# Patient Record
Sex: Female | Born: 1990 | Race: White | Hispanic: No | Marital: Single | Smoking: Current every day smoker
Health system: Southern US, Academic
[De-identification: ages and names within clinical notes are randomized; demographics above are authoritative.]

## PROBLEM LIST (undated history)

## (undated) DIAGNOSIS — F419 Anxiety disorder, unspecified: Secondary | ICD-10-CM

## (undated) DIAGNOSIS — F32A Depression, unspecified: Secondary | ICD-10-CM

## (undated) DIAGNOSIS — K219 Gastro-esophageal reflux disease without esophagitis: Secondary | ICD-10-CM

## (undated) DIAGNOSIS — E079 Disorder of thyroid, unspecified: Secondary | ICD-10-CM

## (undated) DIAGNOSIS — F909 Attention-deficit hyperactivity disorder, unspecified type: Secondary | ICD-10-CM

## (undated) DIAGNOSIS — D649 Anemia, unspecified: Secondary | ICD-10-CM

## (undated) DIAGNOSIS — T7840XA Allergy, unspecified, initial encounter: Secondary | ICD-10-CM

## (undated) DIAGNOSIS — J302 Other seasonal allergic rhinitis: Secondary | ICD-10-CM

## (undated) DIAGNOSIS — G709 Myoneural disorder, unspecified: Secondary | ICD-10-CM

## (undated) DIAGNOSIS — B977 Papillomavirus as the cause of diseases classified elsewhere: Secondary | ICD-10-CM

## (undated) HISTORY — DX: Disorder of thyroid, unspecified: E07.9

## (undated) HISTORY — DX: Myoneural disorder, unspecified: G70.9

## (undated) HISTORY — DX: Anxiety disorder, unspecified: F41.9

## (undated) HISTORY — DX: Attention-deficit hyperactivity disorder, unspecified type: F90.9

## (undated) HISTORY — DX: Allergy, unspecified, initial encounter: T78.40XA

## (undated) HISTORY — DX: Depression, unspecified: F32.A

## (undated) HISTORY — PX: FOOT SURGERY: SHX648

## (undated) HISTORY — DX: Anemia, unspecified: D64.9

## (undated) HISTORY — DX: Gastro-esophageal reflux disease without esophagitis: K21.9

## (undated) HISTORY — PX: DENTAL SURGERY: SHX609

---
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
## 2008-02-02 ENCOUNTER — Encounter: Payer: Self-pay | Admitting: Obstetrics & Gynecology

## 2008-02-02 ENCOUNTER — Ambulatory Visit: Payer: Self-pay | Admitting: Obstetrics and Gynecology

## 2009-03-07 ENCOUNTER — Emergency Department (HOSPITAL_COMMUNITY): Admission: EM | Admit: 2009-03-07 | Discharge: 2009-03-07 | Payer: Self-pay | Admitting: Family Medicine

## 2009-11-14 ENCOUNTER — Emergency Department (HOSPITAL_COMMUNITY): Admission: EM | Admit: 2009-11-14 | Discharge: 2009-11-15 | Payer: Self-pay | Admitting: Emergency Medicine

## 2010-01-19 ENCOUNTER — Emergency Department (HOSPITAL_COMMUNITY)
Admission: EM | Admit: 2010-01-19 | Discharge: 2010-01-19 | Payer: Self-pay | Source: Home / Self Care | Admitting: Family Medicine

## 2010-05-15 ENCOUNTER — Ambulatory Visit: Admit: 2010-05-15 | Payer: Self-pay | Admitting: Obstetrics & Gynecology

## 2010-05-16 ENCOUNTER — Ambulatory Visit: Admit: 2010-05-16 | Payer: Self-pay | Admitting: Obstetrics & Gynecology

## 2010-05-24 ENCOUNTER — Ambulatory Visit: Payer: Self-pay | Admitting: Obstetrics & Gynecology

## 2010-06-24 ENCOUNTER — Inpatient Hospital Stay (INDEPENDENT_AMBULATORY_CARE_PROVIDER_SITE_OTHER)
Admission: RE | Admit: 2010-06-24 | Discharge: 2010-06-24 | Disposition: A | Discharge status: Home / Self Care | Payer: Self-pay | Source: Ambulatory Visit | Attending: Family Medicine | Admitting: Family Medicine

## 2010-06-24 DIAGNOSIS — J019 Acute sinusitis, unspecified: Secondary | ICD-10-CM

## 2010-08-01 ENCOUNTER — Ambulatory Visit: Payer: Self-pay | Admitting: Obstetrics and Gynecology

## 2010-08-09 ENCOUNTER — Ambulatory Visit (INDEPENDENT_AMBULATORY_CARE_PROVIDER_SITE_OTHER): Payer: Medicaid Other | Admitting: Advanced Practice Midwife

## 2010-08-09 ENCOUNTER — Other Ambulatory Visit: Payer: Self-pay | Admitting: Advanced Practice Midwife

## 2010-08-09 DIAGNOSIS — Z01419 Encounter for gynecological examination (general) (routine) without abnormal findings: Secondary | ICD-10-CM

## 2010-08-10 NOTE — Group Therapy Note (Unsigned)
Diane Stokes, Diane Stokes                 ACCOUNT NO.:  [id number]  MEDICAL RECORD NO.:  [id number]           PATIENT TYPE:  A  LOCATION:  [facility]                   FACILITY:  [facility]  PHYSICIAN:  Wynelle Bourgeois, CNM    DATE OF BIRTH:  March 28, 1991  DATE OF SERVICE:  08/09/2010                                 CLINIC NOTE  HISTORY OF PRESENT ILLNESS:  This is a 20 year old, gravida 0 who presents today for her annual exam and Pap smear.  She is also requesting contraception and STD testing.  She states that she is sexually active and that her previous boyfriend was possibly unfaithful to her.  She has previously been on birth control pills, but has trouble remembering to take them and is interested in Depo-Provera.  ALLERGIES:  MINOCYCLINE causes hives.  MEDICATIONS:  Allegra 1 p.o. daily.  MENSTRUAL HISTORY:  See previous note.  OBSTETRIC/GYNECOLOGICAL HISTORY:  Last Pap was in 2009 and was normal.  SURGICAL HISTORY:  Negative.  FAMILY HISTORY:  Diabetes, heart attack, and high blood pressure. Immediate family are healthy.  MEDICAL HISTORY:  A remote history of hypertension related to ADHD medication, otherwise negative.  SOCIAL HISTORY:  The patient quit school secondary to loss of scholarship.  She works at [employer] and denies any alcohol, tobacco, or drug use.  PHYSICAL EXAMINATION:  VITAL SIGNS:  Temperature 97.4, pulse 88, blood pressure 130/87, weight 241.1, and height 61-1/2 inches.  This weight reflects a  gain of 43 pounds. CARDIAC:  Heart rate regular rate and rhythm without murmur. LUNGS:  Clear to auscultation bilaterally. BREASTS:  Soft and nontender. ABDOMEN:  Soft and nontender, obese abdomen limits exam. GYN:  EGBUS within normal limits.  Vagina clear, well rugated without lesions or discharge.  Cervix is closed and long and nulliparous. Uterus is small, nontender with no masses.  Adnexa nontender.  No masses.  However, exam is limited by habitus. EXTREMITIES:   Normal.  ASSESSMENT: 1. Annual exam. 2. Requests sexually transmitted disease testing. 3. Request Depo-Provera.  PLAN: 1. Pap sent. 2. GC/Chlamydia sent. 3. Hep B and C, HIV, and RPR ordered. 4. Depo-Provera 150 mg IM q.12 weeks x1 year. 5. Return in 1 year or as needed.          ______________________________ Wynelle Bourgeois, CNM    MW/MEDQ  D:  08/09/2010  T:  08/10/2010  Job:  [id number]

## 2010-09-03 NOTE — Group Therapy Note (Signed)
Diane Stokes, Diane Stokes NO.:  [id number]   MEDICAL RECORD NO.:  [id number]          PATIENT TYPE:  WOC   LOCATION:  [facility]                   FACILITY:  [facility]   PHYSICIAN:  Argentina Donovan, MD        DATE OF BIRTH:  [birth year]   DATE OF SERVICE:  02/02/2008                                  CLINIC NOTE   REASON FOR VISIT:  Annual exam with Pap smear.   HISTORY OF PRESENT ILLNESS:  Diane Stokes is a 20 year old female, G0, who  presents today for her annual physical exam with Pap smear.  Her last  Pap smear was in August of 2008, and was normal.  She has not been  sexually active but does desire to start birth control at this time.  She has no other complaints today.   ALLERGIES:  She is ALLERGIC to MINOCYCLINE.  This causes hives for her.   CURRENT MEDICATIONS:  1. Vyvanse 70 mg daily for ADHD.  2. Sulfamethoxazole 500 mg b.i.d. for acne.  3. Claritin 10 mg daily for seasonal allergies.   IMMUNIZATIONS:  She has had Rubella, chickenpox, tetanus, and flu  vaccines.   MENSTRUAL HISTORY:  Her last menstrual period was January 10, 2008.  She began her menstrual cycles at the age of 20 years old and they are  regular every 28 days.  They last approximately 7 days.  They are heavy  for the first 4 to 5 days and then lighten up and stop.  She has mild  pain with her periods, and no bleeding in between periods.  She is not  currently using birth control, and she is not currently sexually active;  however, she does desire to start birth control today.   OBSTETRICAL HISTORY:  The patient has never been pregnant.   GYNECOLOGIC HISTORY:  Her last Pap smear was August 2008, and was  normal.  This was her first Pap smear.   PAST SURGICAL HISTORY:  Negative.   FAMILY HISTORY:  She does have grandparents with diabetes, history of  heart attack, and high blood pressure; however, both her parents and all  her siblings are healthy.   PAST MEDICAL HISTORY:  At one point in  time, she did have some problems  with high blood pressure, however, they did find that it was due to an  ADHD med she was taking at that time.  Since they have changed her meds,  she has had no more problems with hypertension.  Otherwise, her past  medical history is negative.   SOCIAL HISTORY:  She is a Consulting civil engineer at [employer].  She is  pre-nursing and hopes to become a midwife one day. She does not smoke or  drink alcohol.  She does drink approximately 2 caffeinated beverages a  day.  She does not use drugs.  Denies any physical or sexual abuse.   A complete 12-point review of systems was negative.   PHYSICAL EXAMINATION:  VITAL SIGNS:  Temperature is 98.5, pulse 92,  blood pressure 122/81, weight is 198 pounds, height is 61 inches.  CARDIOVASCULAR:  Regular  rate and rhythm without murmur, rub, or gallop.  She has 2+ radial and DP pulses.  LUNGS:  Clear to auscultation bilaterally with normal work of breathing  and no wheezing.  ABDOMEN:  Soft, nontender, and nondistended with no masses and positive  bowel sounds.  GYNECOLOGIC EXAM:  Normal external genitalia with no lesions.  Vagina is  pink and moist with normal rugae.  The cervix was visualized midline and  is a nullipara cervix without lesion or friability.  There was no  discharge seen from the os or in the vaginal vault.  Pap smear was  obtained today.  EXTREMITIES:  No cyanosis, clubbing, or edema.  SKIN:  Some mild acne on the face but otherwise without lesion or rash.   ASSESSMENT AND PLAN:  Diane Stokes is a 20 year old G0 female here today for  her complete physical examination with Pap smear.  1. Physical examination.  The patient's examination was completely      normal, and a Pap smear was obtained today, and the patient will be      mailed a letter with the results of her Pap smear.  She should      return again in 1 year for her next yearly examination.  2. For contraception, all options including  intrauterine device,      patch, pill, and Depo-Provera in a shot were discussed with the      patient and she desires to go on the pill.  She has no preference      for which pill and has not heard of one that she prefers over the      other.  She is limited due to finance and so it was decided that we      would put her on Sprintec.  She will start this daily as directed      and will let us know if she is having any problems with that.      Otherwise, she will follow up in 1 year for her next yearly      examination.     ______________________________  Ardeen Garland, MD    ______________________________  Argentina Donovan, MD    LM/MEDQ  D:  02/02/2008  T:  02/02/2008  Job:  [id number]

## 2010-09-28 ENCOUNTER — Emergency Department (HOSPITAL_COMMUNITY)
Admission: EM | Admit: 2010-09-28 | Discharge: 2010-09-28 | Disposition: A | Discharge status: Home / Self Care | Payer: PRIVATE HEALTH INSURANCE | Attending: Emergency Medicine | Admitting: Emergency Medicine

## 2010-09-28 DIAGNOSIS — L2989 Other pruritus: Secondary | ICD-10-CM | POA: Insufficient documentation

## 2010-09-28 DIAGNOSIS — H11419 Vascular abnormalities of conjunctiva, unspecified eye: Secondary | ICD-10-CM | POA: Insufficient documentation

## 2010-09-28 DIAGNOSIS — F988 Other specified behavioral and emotional disorders with onset usually occurring in childhood and adolescence: Secondary | ICD-10-CM | POA: Insufficient documentation

## 2010-09-28 DIAGNOSIS — R6889 Other general symptoms and signs: Secondary | ICD-10-CM | POA: Insufficient documentation

## 2010-09-28 DIAGNOSIS — H109 Unspecified conjunctivitis: Secondary | ICD-10-CM | POA: Insufficient documentation

## 2010-09-28 DIAGNOSIS — L298 Other pruritus: Secondary | ICD-10-CM | POA: Insufficient documentation

## 2010-09-28 DIAGNOSIS — H5789 Other specified disorders of eye and adnexa: Secondary | ICD-10-CM | POA: Insufficient documentation

## 2010-10-25 ENCOUNTER — Ambulatory Visit: Payer: PRIVATE HEALTH INSURANCE

## 2010-10-25 DIAGNOSIS — Z3049 Encounter for surveillance of other contraceptives: Secondary | ICD-10-CM

## 2010-11-06 ENCOUNTER — Telehealth: Payer: Self-pay | Admitting: *Deleted

## 2010-11-06 NOTE — Telephone Encounter (Signed)
Pt states she is having cramps- taking OTC med but not helping and wants Rx for pain. 1027- spoke w/pt who states her cramps have been getting worse since starting Depo Provera injections. She has been taking midol w/little relief of cramps.  I advised taking ibuprofen 600mg  q6hr prn w/food. I also informed pt that her sx are wnl and that her body will take about 6 mos to become fully accustomed to Depo. Pt voiced understanding and had no further questions.

## 2011-01-15 ENCOUNTER — Encounter: Payer: Self-pay | Admitting: Obstetrics and Gynecology

## 2011-01-15 ENCOUNTER — Ambulatory Visit (INDEPENDENT_AMBULATORY_CARE_PROVIDER_SITE_OTHER): Payer: PRIVATE HEALTH INSURANCE | Admitting: Obstetrics and Gynecology

## 2011-01-15 VITALS — BP 122/80 | HR 85

## 2011-01-15 DIAGNOSIS — Z3049 Encounter for surveillance of other contraceptives: Secondary | ICD-10-CM

## 2011-01-15 DIAGNOSIS — IMO0001 Reserved for inherently not codable concepts without codable children: Secondary | ICD-10-CM

## 2011-01-15 MED ORDER — MEDROXYPROGESTERONE ACETATE 150 MG/ML IM SUSP
150.0000 mg | Freq: Once | INTRAMUSCULAR | Status: AC
  Administered 2011-01-15: 150 mg via INTRAMUSCULAR

## 2011-02-26 ENCOUNTER — Emergency Department (INDEPENDENT_AMBULATORY_CARE_PROVIDER_SITE_OTHER)
Admission: EM | Admit: 2011-02-26 | Discharge: 2011-02-26 | Disposition: A | Discharge status: Home / Self Care | Payer: Self-pay | Source: Home / Self Care | Attending: Family Medicine | Admitting: Family Medicine

## 2011-02-26 ENCOUNTER — Encounter (HOSPITAL_COMMUNITY): Payer: Self-pay | Admitting: *Deleted

## 2011-02-26 DIAGNOSIS — S93401A Sprain of unspecified ligament of right ankle, initial encounter: Secondary | ICD-10-CM

## 2011-02-26 DIAGNOSIS — S93409A Sprain of unspecified ligament of unspecified ankle, initial encounter: Secondary | ICD-10-CM

## 2011-02-26 HISTORY — DX: Other seasonal allergic rhinitis: J30.2

## 2011-02-26 NOTE — ED Provider Notes (Signed)
History     CSN: [id number] Arrival date & time: 02/26/2011  7:39 PM   First MD Initiated Contact with Patient 02/26/11 1940      Chief Complaint  Patient presents with  . Ankle Pain    pt with osnet of right ankle foot pain x 2 days  - swelling - no known injury    (Consider location/radiation/quality/duration/timing/severity/associated sxs/prior treatment) Patient is a 20 y.o. female presenting with ankle pain. The history is provided by the patient.  Ankle Pain This is a new problem. The current episode started 2 days ago. The problem occurs constantly. The problem has not changed since onset.The symptoms are aggravated by walking.  SHE DENIES ANY INJURY. IS ON HER FEET A LOT. NO NUMBNESS OR TINGLING. HAS NOTED SWELLING OVER THE LATERAL MALLEOLUS OF THE RIGHT ANKLE  Past Medical History  Diagnosis Date  . Seasonal allergies     History reviewed. No pertinent past surgical history.  Family History  Problem Relation Age of Onset  . Diabetes Maternal Grandfather   . Hypertension Maternal Grandfather   . Hyperlipidemia Mother   . Hypertension Mother     History  Substance Use Topics  . Smoking status: Never Smoker   . Smokeless tobacco: Never Used  . Alcohol Use: Not on file    OB History    Grav Para Term Preterm Abortions TAB SAB Ect Mult Living   0               Review of Systems  Constitutional: Negative.   Respiratory: Negative.   Cardiovascular: Negative.     Allergies  Minocycline  Home Medications   Current Outpatient Rx  Name Route Sig Dispense Refill  . CETIRIZINE HCL 10 MG PO TABS Oral Take 10 mg by mouth daily.        BP 149/92  Pulse 86  Temp(Src) 98.6 F (37 C) (Oral)  Resp 18  Physical Exam  Nursing note and vitals reviewed. Constitutional: She appears well-developed.  Cardiovascular: Normal rate.   Pulmonary/Chest: Effort normal.  Musculoskeletal:       EVAL OF THE RIGHT ANKLE REVEALS SWELLING OVER THE LATERAL MALLEOLUS. ROM  INTACT. SENSORY INTACT DISTALLY. GOOD CAP REFILL AND DP PULSE. NO SKIN CHANGES. SLIGHT TENDERNESS OVER THE TIP OF THE DISTAL FIBULA    ED Course  Procedures (including critical care time)  Labs Reviewed - No data to display No results found.   No diagnosis found.    MDM          Randa Spike, MD 02/26/11 2003

## 2011-03-03 ENCOUNTER — Telehealth (HOSPITAL_COMMUNITY): Payer: Self-pay | Admitting: *Deleted

## 2011-04-02 ENCOUNTER — Ambulatory Visit (INDEPENDENT_AMBULATORY_CARE_PROVIDER_SITE_OTHER): Payer: Self-pay

## 2011-04-02 VITALS — BP 144/96 | HR 118

## 2011-04-02 DIAGNOSIS — Z3049 Encounter for surveillance of other contraceptives: Secondary | ICD-10-CM

## 2011-04-02 MED ORDER — MEDROXYPROGESTERONE ACETATE 150 MG/ML IM SUSP
150.0000 mg | Freq: Once | INTRAMUSCULAR | Status: AC
  Administered 2011-04-02: 150 mg via INTRAMUSCULAR

## 2011-04-17 ENCOUNTER — Ambulatory Visit
Admission: RE | Admit: 2011-04-17 | Discharge: 2011-04-17 | Disposition: A | Discharge status: Home / Self Care | Payer: Managed Care, Other (non HMO) | Source: Ambulatory Visit | Attending: Orthopedic Surgery | Admitting: Orthopedic Surgery

## 2011-04-17 ENCOUNTER — Other Ambulatory Visit: Payer: Self-pay | Admitting: Orthopedic Surgery

## 2011-04-17 DIAGNOSIS — M8430XA Stress fracture, unspecified site, initial encounter for fracture: Secondary | ICD-10-CM

## 2011-04-17 DIAGNOSIS — IMO0002 Reserved for concepts with insufficient information to code with codable children: Secondary | ICD-10-CM

## 2011-06-18 ENCOUNTER — Ambulatory Visit: Payer: Self-pay

## 2011-06-23 ENCOUNTER — Ambulatory Visit: Payer: Managed Care, Other (non HMO)

## 2011-07-07 ENCOUNTER — Ambulatory Visit (INDEPENDENT_AMBULATORY_CARE_PROVIDER_SITE_OTHER): Payer: Managed Care, Other (non HMO)

## 2011-07-07 VITALS — BP 122/77 | HR 74

## 2011-07-07 DIAGNOSIS — Z3049 Encounter for surveillance of other contraceptives: Secondary | ICD-10-CM

## 2011-07-07 LAB — POCT PREGNANCY, URINE: Preg Test, Ur: NEGATIVE

## 2011-07-07 MED ORDER — MEDROXYPROGESTERONE ACETATE 150 MG/ML IM SUSP
150.0000 mg | Freq: Once | INTRAMUSCULAR | Status: AC
  Administered 2011-07-07: 150 mg via INTRAMUSCULAR

## 2011-09-29 ENCOUNTER — Ambulatory Visit (INDEPENDENT_AMBULATORY_CARE_PROVIDER_SITE_OTHER): Payer: Managed Care, Other (non HMO) | Admitting: *Deleted

## 2011-09-29 VITALS — BP 118/78 | HR 88 | Temp 98.5°F | Ht 61.5 in | Wt 221.8 lb

## 2011-09-29 DIAGNOSIS — Z3049 Encounter for surveillance of other contraceptives: Secondary | ICD-10-CM

## 2011-09-29 MED ORDER — MEDROXYPROGESTERONE ACETATE 150 MG/ML IM SUSP
150.0000 mg | Freq: Once | INTRAMUSCULAR | Status: AC
  Administered 2011-09-29: 150 mg via INTRAMUSCULAR

## 2011-11-05 ENCOUNTER — Emergency Department: Payer: Self-pay | Admitting: Unknown Physician Specialty

## 2011-11-05 LAB — CBC
HGB: 14.4 g/dL (ref 12.0–16.0)
MCH: 30.2 pg (ref 26.0–34.0)
MCV: 92 fL (ref 80–100)
Platelet: 220 10*3/uL (ref 150–440)
RBC: 4.76 10*6/uL (ref 3.80–5.20)
RDW: 12.6 % (ref 11.5–14.5)
WBC: 6.1 10*3/uL (ref 3.6–11.0)

## 2011-11-05 LAB — URINALYSIS, COMPLETE
Blood: NEGATIVE
Ketone: NEGATIVE
Nitrite: NEGATIVE
Protein: NEGATIVE
Specific Gravity: 1.001 (ref 1.003–1.030)
WBC UR: 1 /HPF (ref 0–5)

## 2011-11-05 LAB — COMPREHENSIVE METABOLIC PANEL
Albumin: 4.1 g/dL (ref 3.4–5.0)
Alkaline Phosphatase: 92 U/L (ref 50–136)
BUN: 7 mg/dL (ref 7–18)
Calcium, Total: 8.8 mg/dL (ref 8.5–10.1)
EGFR (African American): >60
EGFR (Non-African Amer.): >60
Glucose: 99 mg/dL (ref 65–99)
Potassium: 3.4 mmol/L — ABNORMAL LOW (ref 3.5–5.1)
SGOT(AST): 23 U/L (ref 15–37)
Total Protein: 7.6 g/dL (ref 6.4–8.2)

## 2011-11-05 LAB — CK TOTAL AND CKMB (NOT AT ARMC)
CK, Total: 97 U/L (ref 21–215)
CK-MB: <0.5 ng/mL — ABNORMAL LOW (ref 0.5–3.6)

## 2011-11-05 LAB — TROPONIN I: Troponin-I: <0.02 ng/mL

## 2011-12-15 ENCOUNTER — Ambulatory Visit: Payer: Managed Care, Other (non HMO)

## 2011-12-15 ENCOUNTER — Ambulatory Visit: Payer: Managed Care, Other (non HMO) | Admitting: Obstetrics & Gynecology

## 2011-12-24 ENCOUNTER — Ambulatory Visit (INDEPENDENT_AMBULATORY_CARE_PROVIDER_SITE_OTHER): Payer: Managed Care, Other (non HMO) | Admitting: *Deleted

## 2011-12-24 VITALS — BP 130/87 | HR 82 | Temp 97.8°F | Ht 62.0 in | Wt 211.6 lb

## 2011-12-24 DIAGNOSIS — Z3049 Encounter for surveillance of other contraceptives: Secondary | ICD-10-CM

## 2011-12-24 DIAGNOSIS — IMO0001 Reserved for inherently not codable concepts without codable children: Secondary | ICD-10-CM

## 2011-12-24 MED ORDER — MEDROXYPROGESTERONE ACETATE 150 MG/ML IM SUSP
150.0000 mg | Freq: Once | INTRAMUSCULAR | Status: AC
  Administered 2011-12-24: 150 mg via INTRAMUSCULAR

## 2011-12-24 NOTE — Progress Notes (Signed)
Patient here for depoprovera- we discussed per chart review has not had annual exam/pap since April, 2012- discussed with Dr. Debroah Loop and patient may have depoprovera today, but then needs annual exam and pap if indicated before can get next depoprovera. Patient voices understanding.

## 2011-12-31 ENCOUNTER — Ambulatory Visit: Payer: Managed Care, Other (non HMO) | Admitting: Obstetrics and Gynecology

## 2012-01-16 ENCOUNTER — Ambulatory Visit: Payer: Managed Care, Other (non HMO) | Admitting: Obstetrics & Gynecology

## 2012-02-16 ENCOUNTER — Ambulatory Visit: Payer: Managed Care, Other (non HMO) | Admitting: Advanced Practice Midwife

## 2012-03-10 ENCOUNTER — Ambulatory Visit: Payer: Managed Care, Other (non HMO)

## 2012-03-10 ENCOUNTER — Encounter: Payer: Self-pay | Admitting: Obstetrics & Gynecology

## 2012-03-10 ENCOUNTER — Ambulatory Visit (INDEPENDENT_AMBULATORY_CARE_PROVIDER_SITE_OTHER): Payer: Managed Care, Other (non HMO) | Admitting: Obstetrics & Gynecology

## 2012-03-10 VITALS — BP 118/68 | HR 86 | Temp 98.2°F | Ht 61.5 in | Wt 198.0 lb

## 2012-03-10 DIAGNOSIS — Z01419 Encounter for gynecological examination (general) (routine) without abnormal findings: Secondary | ICD-10-CM

## 2012-03-10 DIAGNOSIS — Z3049 Encounter for surveillance of other contraceptives: Secondary | ICD-10-CM

## 2012-03-10 DIAGNOSIS — Z3009 Encounter for other general counseling and advice on contraception: Secondary | ICD-10-CM

## 2012-03-10 DIAGNOSIS — D699 Hemorrhagic condition, unspecified: Secondary | ICD-10-CM

## 2012-03-10 LAB — CBC WITH DIFFERENTIAL/PLATELET
Eosinophils Absolute: 0.1 10*3/uL (ref 0.0–0.7)
HCT: 41.9 % (ref 36.0–46.0)
Lymphs Abs: 1.6 10*3/uL (ref 0.7–4.0)
MCH: 31.5 pg (ref 26.0–34.0)
MCV: 91.1 fL (ref 78.0–100.0)
Monocytes Absolute: 0.5 10*3/uL (ref 0.1–1.0)
Monocytes Relative: 7 % (ref 3–12)
Neutro Abs: 3.9 10*3/uL (ref 1.7–7.7)
Neutrophils Relative %: 64 % (ref 43–77)
Platelets: 235 10*3/uL (ref 150–400)
RDW: 12.6 % (ref 11.5–15.5)
WBC: 6.2 10*3/uL (ref 4.0–10.5)

## 2012-03-10 MED ORDER — MEDROXYPROGESTERONE ACETATE 150 MG/ML IM SUSP: 150.0000 mg | INTRAMUSCULAR | Status: DC

## 2012-03-10 MED ORDER — MEDROXYPROGESTERONE ACETATE 150 MG/ML IM SUSP
150.0000 mg | INTRAMUSCULAR | Status: DC
  Administered 2012-03-10: 150 mg via INTRAMUSCULAR

## 2012-03-10 NOTE — Addendum Note (Signed)
Addended by: Gerome Apley on: 03/10/2012 01:41 PM   Modules accepted: Orders

## 2012-03-10 NOTE — Patient Instructions (Signed)

## 2012-03-10 NOTE — Progress Notes (Signed)
Patient ID: GLENA PHARRIS, female   DOB: [birth year], 21 y.o.   MRN: [id number] Subjective:     RAYLEI LOSURDO is a 21 y.o. female here for a routine exam.  Pt does c/o easy bruisability x 1 year. Current complaints: none.   Gynecologic History No LMP recorded. Patient has had an injection. Contraception: Depo-Provera injections Last Pap: 2012. Results were: normal Last mammogram: n/a.  Obstetric History OB History    Grav Para Term Preterm Abortions TAB SAB Ect Mult Living   0                The following portions of the patient's history were reviewed and updated as appropriate: allergies, current medications, past family history, past medical history, past social history, past surgical history and problem list.  Review of Systems A comprehensive review of systems was negative.    Objective:    BP 118/68  Pulse 86  Temp 98.2 F (36.8 C) (Oral)  Ht 5' 1.5" (1.562 m)  Wt 198 lb (89.812 kg)  BMI 36.81 kg/m2  General Appearance:    Alert, cooperative, no distress, appears stated age  Head:    Normocephalic, without obvious abnormality, atraumatic              Neck:   Supple, symmetrical, trachea midline, no adenopathy;    thyroid:  no enlargement/tenderness/nodules; no carotid   bruit or JVD  Back:     Symmetric, no curvature, ROM normal, no CVA tenderness  Lungs:     Clear to auscultation bilaterally, respirations unlabored  Chest Wall:    No tenderness or deformity   Heart:    Regular rate and rhythm, S1 and S2 normal, no murmur, rub   or gallop  Breast Exam:    No tenderness, masses, or nipple abnormality  Abdomen:     Soft, non-tender, bowel sounds active all four quadrants,    no masses, no organomegaly  Genitalia:    Normal female without lesion, discharge or tenderness     Extremities:   Extremities normal, atraumatic, no cyanosis or edema  Pulses:   2+ and symmetric all extremities  Skin:   Skin color, texture, turgor normal, no rashes or lesions  Lymph nodes:    Cervical, supraclavicular, and axillary nodes normal         Assessment:    Healthy female exam.  Contraception counseling   Plan:  Pt may consider Mirena- for now, will continue Depo provera.  Contraception: Depo-Provera injections. Follow up in: 1 year. labs to check bleeding status: PT/INR, PTT, CBC   Depo Provera 150mg  today  Geremy Rister L. Harraway-Smith, M.D., [facility]

## 2012-03-17 ENCOUNTER — Telehealth: Payer: Self-pay | Admitting: *Deleted

## 2012-03-17 NOTE — Telephone Encounter (Addendum)
Pt left message requesting lab results from 11/20.   I returned pt's call and informed her that her lab tests for bleeding problems were normal. Her Pap showed the same abnormal cells as on her last Pap in April 2012. She will need repeat Pap in 1 year.  Pt voiced understanding.

## 2012-03-24 ENCOUNTER — Telehealth: Payer: Self-pay | Admitting: General Practice

## 2012-03-24 NOTE — Telephone Encounter (Signed)
Message copied by Gerome Apley on Wed Mar 24, 2012  1:21 PM ------      Message from: [place], VANESSA G      Created: Tue Mar 23, 2012 12:05 PM       04-07-2012@3 :15pm      ----- Message -----         From: Faith Rogue Rash, LPN         Sent: 03/22/2012  11:39 AM           To: [facility] Admin Pool            Please send back to clinical pool after appt made and we will contact patient.             ----- Message -----         From: Willodean Rosenthal, MD         Sent: 03/22/2012  11:09 AM           To: [facility]            Please call pt and notify of abnormal PAP and need for colpo.  Pls schedule colpo.            Thx,      clh-S

## 2012-03-24 NOTE — Telephone Encounter (Signed)
Message copied by Kathee Delton on Wed Mar 24, 2012  1:17 PM ------      Message from: [place], VANESSA G      Created: Tue Mar 23, 2012 12:05 PM       04-07-2012@3 :15pm      ----- Message -----         From: Faith Rogue Rash, LPN         Sent: 03/22/2012  11:39 AM           To: [facility] Pool            Please send back to clinical pool after appt made and we will contact patient.             ----- Message -----         From: Willodean Rosenthal, MD         Sent: 03/22/2012  11:09 AM           To: [facility]            Please call pt and notify of abnormal PAP and need for colpo.  Pls schedule colpo.            Thx,      clh-S

## 2012-03-24 NOTE — Telephone Encounter (Signed)
Called patient and informed of her abnormal pap and need for colposcopy and informed patient of 12/18 appt at 3:15. Patient verbalized understanding and had no further questions. Told patient should she have additional questions or any concerns to call us back at the clinic

## 2012-04-07 ENCOUNTER — Encounter: Payer: Managed Care, Other (non HMO) | Admitting: Obstetrics & Gynecology

## 2012-04-19 ENCOUNTER — Encounter (HOSPITAL_COMMUNITY): Payer: Self-pay | Admitting: *Deleted

## 2012-04-19 ENCOUNTER — Emergency Department (INDEPENDENT_AMBULATORY_CARE_PROVIDER_SITE_OTHER)
Admission: EM | Admit: 2012-04-19 | Discharge: 2012-04-19 | Disposition: A | Discharge status: Home / Self Care | Payer: Managed Care, Other (non HMO) | Source: Home / Self Care | Attending: Emergency Medicine | Admitting: Emergency Medicine

## 2012-04-19 DIAGNOSIS — J069 Acute upper respiratory infection, unspecified: Secondary | ICD-10-CM

## 2012-04-19 DIAGNOSIS — R05 Cough: Secondary | ICD-10-CM

## 2012-04-19 MED ORDER — GUAIFENESIN-CODEINE 100-10 MG/5ML PO SYRP: 5.0000 mL | ORAL_SOLUTION | Freq: Three times a day (TID) | ORAL | Status: DC | PRN

## 2012-04-19 MED ORDER — FEXOFENADINE-PSEUDOEPHED ER 60-120 MG PO TB12: 1.0000 | ORAL_TABLET | Freq: Two times a day (BID) | ORAL | Status: DC

## 2012-04-19 NOTE — ED Provider Notes (Signed)
History     CSN: [id number]  Arrival date & time 04/19/12  1507   First MD Initiated Contact with Patient 04/19/12 1558      Chief Complaint  Patient presents with  . Cough    (Consider location/radiation/quality/duration/timing/severity/associated sxs/prior treatment) HPI Comments: Patient presents urgent care complaining of cough sneezing runny nose for 2 days. Also noticing her voice is hoarse. No fever but has had some productive cough with yellow sputum and nasal discharge. With a streak of blood in it. No SOB...  Patient is a 21 y.o. female presenting with cough. The history is provided by the patient.  Cough This is a new problem. The current episode started 2 days ago. The problem occurs constantly. The problem has not changed since onset.The cough is non-productive. There has been no fever. Associated symptoms include chills, ear pain and rhinorrhea. Pertinent negatives include no chest pain, no myalgias, no shortness of breath and no wheezing. She has tried nothing for the symptoms. Her past medical history does not include bronchitis, pneumonia or emphysema.    Past Medical History  Diagnosis Date  . Seasonal allergies     History reviewed. No pertinent past surgical history.  Family History  Problem Relation Age of Onset  . Diabetes Maternal Grandfather   . Hypertension Maternal Grandfather   . Hyperlipidemia Mother   . Hypertension Mother     History  Substance Use Topics  . Smoking status: Never Smoker   . Smokeless tobacco: Never Used  . Alcohol Use: 0.6 oz/week    1 Cans of beer per week     Comment: occasional    OB History    Grav Para Term Preterm Abortions TAB SAB Ect Mult Living   0               Review of Systems  Constitutional: Positive for chills. Negative for fever, diaphoresis, activity change, fatigue and unexpected weight change.  HENT: Positive for ear pain, congestion and rhinorrhea. Negative for sneezing, neck pain, neck  stiffness, postnasal drip and ear discharge.   Respiratory: Positive for cough. Negative for shortness of breath and wheezing.   Cardiovascular: Negative for chest pain.  Genitourinary: Negative for dysuria.  Musculoskeletal: Negative for myalgias.  Skin: Negative for color change, pallor and rash.  Hematological: Negative for adenopathy.    Allergies  Minocycline  Home Medications   Current Outpatient Rx  Name  Route  Sig  Dispense  Refill  . PSEUDOEPHEDRINE HCL ER 120 MG PO TB12   Oral   Take 120 mg by mouth every 12 (twelve) hours.         . CETIRIZINE HCL 10 MG PO TABS   Oral   Take 10 mg by mouth daily.           Marland Kitchen FEXOFENADINE-PSEUDOEPHED ER 60-120 MG PO TB12   Oral   Take 1 tablet by mouth every 12 (twelve) hours.   14 tablet   0   . GUAIFENESIN-CODEINE 100-10 MG/5ML PO SYRP   Oral   Take 5 mLs by mouth 3 (three) times daily as needed for cough.   120 mL   0     BP 110/70  Pulse 93  Temp 99 F (37.2 C) (Oral)  Resp 18  SpO2 100%  Physical Exam  Nursing note and vitals reviewed. Constitutional: Vital signs are normal. She appears well-developed and well-nourished.  HENT:  Head: Normocephalic.  Right Ear: Tympanic membrane normal.  Left Ear: Tympanic membrane normal.  Nose: Nose normal.  Mouth/Throat: Uvula is midline and mucous membranes are normal. Posterior oropharyngeal erythema present. No oropharyngeal exudate or tonsillar abscesses.  Eyes: Conjunctivae normal are normal. Right eye exhibits no discharge. Left eye exhibits discharge. No scleral icterus.  Neck: Neck supple. No JVD present.  Cardiovascular: Normal rate.  Exam reveals no gallop and no friction rub.   No murmur heard. Pulmonary/Chest: Effort normal and breath sounds normal. No respiratory distress. She has no decreased breath sounds. She has no wheezes. She has no rhonchi. She has no rales.  Abdominal: She exhibits no distension.  Lymphadenopathy:    She has no cervical  adenopathy.  Neurological: She is alert.  Skin: No rash noted. No erythema.    ED Course  Procedures (including critical care time)  Labs Reviewed - No data to display No results found.   1. Upper respiratory infection   2. Cough       MDM  No distress. Symptoms and exam consistent with an upper respiratory infection. Encouraged patient to pursue with symptomatic management and good hydration. Have provider with a works note for 48 hours.        Jimmie Molly, MD 04/19/12 [phone]

## 2012-04-19 NOTE — ED Notes (Signed)
C/o sneezing and coughing onset 2 days ago.  Throat started hurting on Sat. and got laryngitis.  No fever.  Cough prod. of yellow sputum and nasal drainage had blood in it.

## 2012-05-11 ENCOUNTER — Telehealth: Payer: Self-pay | Admitting: *Deleted

## 2012-05-11 ENCOUNTER — Encounter: Payer: Self-pay | Admitting: *Deleted

## 2012-05-11 NOTE — Telephone Encounter (Addendum)
Pt left message stating that she has a concern about Depo Provera injection. Please call back.  **Note: pt DNKA on 12/18 for colpo and was aware of appt per documentation - needs to be rescheduled. 1500- spoke w/pt. She states that she has been taking the Depo shot for 2 years, never gets a period but last week, she started a period. She wants to know if this is normal. I told her that it is normal to get occasional periods while taking the Depo Provera on a long term basis. She can discuss further with MD at her next appt if she is still concerned. I also explained that she will need to have her colpo procedure at time of injection appt. Pt agreed but asked for appt to be changed due to she is in the process of changing her insurance. I stated that I will call her back tomorrow with new appt information. Pt voiced understanding.

## 2012-05-12 NOTE — Telephone Encounter (Signed)
Spoke to patient and states that she will not be eligible to insurance from her work. However, she will be able to pay the depo of $150 plus the $20 for colposcopy. She has been advised to get a financial aid application and see what she could qualify for. She is aware of appt on 06/04/12. Patient agrees and satisfied.

## 2012-05-26 ENCOUNTER — Ambulatory Visit: Payer: Managed Care, Other (non HMO)

## 2012-05-26 ENCOUNTER — Encounter: Payer: Managed Care, Other (non HMO) | Admitting: Obstetrics & Gynecology

## 2012-06-04 ENCOUNTER — Encounter: Payer: Managed Care, Other (non HMO) | Admitting: Obstetrics & Gynecology

## 2012-06-09 ENCOUNTER — Ambulatory Visit (INDEPENDENT_AMBULATORY_CARE_PROVIDER_SITE_OTHER): Payer: Self-pay | Admitting: General Practice

## 2012-06-09 VITALS — BP 113/80 | HR 87 | Temp 98.3°F | Ht 61.5 in | Wt 221.7 lb

## 2012-06-09 DIAGNOSIS — Z3041 Encounter for surveillance of contraceptive pills: Secondary | ICD-10-CM

## 2012-06-09 DIAGNOSIS — Z3049 Encounter for surveillance of other contraceptives: Secondary | ICD-10-CM

## 2012-06-09 DIAGNOSIS — Z3040 Encounter for surveillance of contraceptives, unspecified: Secondary | ICD-10-CM

## 2012-06-09 MED ORDER — MEDROXYPROGESTERONE ACETATE 150 MG/ML IM SUSP
150.0000 mg | Freq: Once | INTRAMUSCULAR | Status: AC
  Administered 2012-06-09: 150 mg via INTRAMUSCULAR

## 2012-06-23 ENCOUNTER — Encounter: Payer: Self-pay | Admitting: Obstetrics & Gynecology

## 2012-08-25 ENCOUNTER — Ambulatory Visit: Payer: Self-pay

## 2012-09-02 ENCOUNTER — Ambulatory Visit: Payer: Self-pay

## 2012-09-03 ENCOUNTER — Ambulatory Visit (INDEPENDENT_AMBULATORY_CARE_PROVIDER_SITE_OTHER): Payer: Self-pay | Admitting: General Practice

## 2012-09-03 VITALS — BP 128/83 | HR 83 | Temp 98.0°F | Ht 61.5 in | Wt 232.9 lb

## 2012-09-03 DIAGNOSIS — Z3049 Encounter for surveillance of other contraceptives: Secondary | ICD-10-CM

## 2012-09-03 DIAGNOSIS — IMO0001 Reserved for inherently not codable concepts without codable children: Secondary | ICD-10-CM

## 2012-09-03 MED ORDER — MEDROXYPROGESTERONE ACETATE 150 MG/ML IM SUSP
150.0000 mg | Freq: Once | INTRAMUSCULAR | Status: AC
  Administered 2012-09-03: 150 mg via INTRAMUSCULAR

## 2012-10-04 ENCOUNTER — Ambulatory Visit: Payer: Self-pay | Admitting: Obstetrics & Gynecology

## 2012-11-08 ENCOUNTER — Emergency Department (INDEPENDENT_AMBULATORY_CARE_PROVIDER_SITE_OTHER)
Admission: EM | Admit: 2012-11-08 | Discharge: 2012-11-08 | Disposition: A | Discharge status: Home / Self Care | Payer: Self-pay | Source: Home / Self Care

## 2012-11-08 ENCOUNTER — Encounter (HOSPITAL_COMMUNITY): Payer: Self-pay

## 2012-11-08 DIAGNOSIS — S0501XA Injury of conjunctiva and corneal abrasion without foreign body, right eye, initial encounter: Secondary | ICD-10-CM

## 2012-11-08 DIAGNOSIS — S058X9A Other injuries of unspecified eye and orbit, initial encounter: Secondary | ICD-10-CM

## 2012-11-08 DIAGNOSIS — H109 Unspecified conjunctivitis: Secondary | ICD-10-CM

## 2012-11-08 MED ORDER — GENTAMICIN SULFATE 0.3 % OP OINT: TOPICAL_OINTMENT | Freq: Three times a day (TID) | OPHTHALMIC | Status: DC

## 2012-11-08 NOTE — ED Notes (Signed)
Exam for red eyes

## 2012-11-08 NOTE — ED Provider Notes (Signed)
History    CSN: [id number] Arrival date & time 11/08/12  1940  First MD Initiated Contact with Patient 11/08/12 2005     Chief Complaint  Patient presents with  . Conjunctivitis   (Consider location/radiation/quality/duration/timing/severity/associated sxs/prior Treatment) HPI Comments: 22 year old female is had what she calls a mole on the edge of her right upper we have had the last one for3 years. She states in the past several days been getting larger. She was rubbing it today and it broke  a offnd the pus exuded from the lesion. Her eye has since become erythematous and swollen. She denies problems with vision.  Past Medical History  Diagnosis Date  . Seasonal allergies    History reviewed. No pertinent past surgical history. Family History  Problem Relation Age of Onset  . Diabetes Maternal Grandfather   . Hypertension Maternal Grandfather   . Hyperlipidemia Mother   . Hypertension Mother    History  Substance Use Topics  . Smoking status: Never Smoker   . Smokeless tobacco: Never Used  . Alcohol Use: 0.6 oz/week    1 Cans of beer per week     Comment: occasional   OB History   Grav Para Term Preterm Abortions TAB SAB Ect Mult Living   0              Review of Systems  HENT: Negative for neck pain and neck stiffness.   All other systems reviewed and are negative.    Allergies  Minocycline  Home Medications   Current Outpatient Rx  Name  Route  Sig  Dispense  Refill  . cetirizine (ZYRTEC) 10 MG tablet   Oral   Take 10 mg by mouth daily.           . fexofenadine-pseudoephedrine (ALLEGRA-D) 60-120 MG per tablet   Oral   Take 1 tablet by mouth every 12 (twelve) hours.   14 tablet   0   . gentamicin (GARAMYCIN) 0.3 % ophthalmic ointment   Right Eye   Place into the right eye 3 (three) times daily.   3.5 g   0   . guaiFENesin-codeine (ROBITUSSIN AC) 100-10 MG/5ML syrup   Oral   Take 5 mLs by mouth 3 (three) times daily as needed for cough.  120 mL   0   . pseudoephedrine (SUDAFED) 120 MG 12 hr tablet   Oral   Take 120 mg by mouth every 12 (twelve) hours.          BP 130/78  Pulse 82  Temp(Src) 98.3 F (36.8 C) (Oral)  Resp 16  SpO2 100% Physical Exam  Nursing note and vitals reviewed. Constitutional: She is oriented to person, place, and time. She appears well-developed and well-nourished. No distress.  Eyes: EOM are normal. Pupils are equal, round, and reactive to light. Right eye exhibits discharge.  There is a 1 mm or less soft tissue lesion in the region of the eyelashes of the upper lid. The conjunctiva and sclera are erythematous. There is mild swelling to the lower lid. There is a mucoid discharge.  Neck: Normal range of motion. Neck supple.  Cardiovascular: Normal rate.   Pulmonary/Chest: Effort normal.  Lymphadenopathy:    She has no cervical adenopathy.  Neurological: She is alert and oriented to person, place, and time. She exhibits normal muscle tone.  Skin: Skin is warm and dry.    ED Course  Procedures (including critical care time) Labs Reviewed - No data to display No results  found. 1. Conjunctivitis of right eye   2. Corneal abrasion, right, initial encounter     MDM  Garamycin ointment ophthalmic 3 times a day to right. See Opthalmologist in  2 days not better or getting worse in the next 24 hours followup with the above ophthalmologist Apply warm compresses as directed. Wash hands frequently  Hayden Rasmussen, NP 11/08/12 2137

## 2012-11-09 NOTE — ED Provider Notes (Signed)
Medical screening examination/treatment/procedure(s) were performed by resident physician or non-physician practitioner and as supervising physician I was immediately available for consultation/collaboration.   Christoph Copelan DOUGLAS MD.   Talina Pleitez D Gabi Mcfate, MD 11/09/12 1741 

## 2012-11-22 ENCOUNTER — Ambulatory Visit: Payer: Self-pay

## 2012-12-06 ENCOUNTER — Ambulatory Visit: Payer: Self-pay

## 2013-01-17 ENCOUNTER — Ambulatory Visit: Payer: Self-pay | Admitting: Obstetrics & Gynecology

## 2013-02-07 ENCOUNTER — Ambulatory Visit: Payer: Self-pay

## 2013-02-17 ENCOUNTER — Ambulatory Visit (INDEPENDENT_AMBULATORY_CARE_PROVIDER_SITE_OTHER): Payer: BC Managed Care – PPO

## 2013-02-17 VITALS — BP 123/83 | HR 84 | Ht 62.0 in | Wt 238.1 lb

## 2013-02-17 DIAGNOSIS — Z3202 Encounter for pregnancy test, result negative: Secondary | ICD-10-CM

## 2013-02-17 DIAGNOSIS — Z3049 Encounter for surveillance of other contraceptives: Secondary | ICD-10-CM

## 2013-02-17 DIAGNOSIS — Z23 Encounter for immunization: Secondary | ICD-10-CM

## 2013-02-17 MED ORDER — MEDROXYPROGESTERONE ACETATE 104 MG/0.65ML ~~LOC~~ SUSP
104.0000 mg | Freq: Once | SUBCUTANEOUS | Status: AC
Start: 2013-02-17 — End: 2013-02-17
  Administered 2013-02-17: 104 mg via SUBCUTANEOUS

## 2013-02-23 ENCOUNTER — Encounter: Payer: Self-pay | Admitting: *Deleted

## 2013-03-24 ENCOUNTER — Encounter (HOSPITAL_COMMUNITY): Payer: Self-pay | Admitting: Emergency Medicine

## 2013-03-24 ENCOUNTER — Emergency Department (INDEPENDENT_AMBULATORY_CARE_PROVIDER_SITE_OTHER)
Admission: EM | Admit: 2013-03-24 | Discharge: 2013-03-24 | Disposition: A | Discharge status: Home / Self Care | Payer: BC Managed Care – PPO | Source: Home / Self Care | Attending: Family Medicine | Admitting: Family Medicine

## 2013-03-24 DIAGNOSIS — H109 Unspecified conjunctivitis: Secondary | ICD-10-CM

## 2013-03-24 DIAGNOSIS — J40 Bronchitis, not specified as acute or chronic: Secondary | ICD-10-CM

## 2013-03-24 MED ORDER — AZITHROMYCIN 250 MG PO TABS: 250.0000 mg | ORAL_TABLET | Freq: Every day | ORAL | Status: DC

## 2013-03-24 MED ORDER — TOBRAMYCIN 0.3 % OP OINT: 1.0000 "application " | TOPICAL_OINTMENT | Freq: Three times a day (TID) | OPHTHALMIC | Status: DC

## 2013-03-24 MED ORDER — BENZONATATE 100 MG PO CAPS: 100.0000 mg | ORAL_CAPSULE | Freq: Three times a day (TID) | ORAL | Status: DC

## 2013-03-24 NOTE — ED Notes (Signed)
C/o left eye redness, the eye and eye lids are red.  Patient has had a cough for a month.

## 2013-03-24 NOTE — ED Provider Notes (Signed)
Medical screening examination/treatment/procedure(s) were performed by resident physician or non-physician practitioner and as supervising physician I was immediately available for consultation/collaboration.   Izekiel Flegel DOUGLAS MD.   Sonja Manseau D Tacy Chavis, MD 03/24/13 2116 

## 2013-03-24 NOTE — ED Provider Notes (Signed)
CSN: [id number]     Arrival date & time 03/24/13  1657 History   First MD Initiated Contact with Patient 03/24/13 1754     Chief Complaint  Patient presents with  . Conjunctivitis   (Consider location/radiation/quality/duration/timing/severity/associated sxs/prior Treatment) Patient is a 22 y.o. female presenting with cough. The history is provided by the patient. No language interpreter was used.  Cough Cough characteristics:  Productive Sputum characteristics:  Nondescript Severity:  Mild Onset quality:  Gradual Duration:  4 weeks Timing:  Constant Chronicity:  New Smoker: no   Relieved by:  Nothing Worsened by:  Nothing tried Ineffective treatments:  None tried Associated symptoms: no chest pain    Pt complains of redness to left eye Past Medical History  Diagnosis Date  . Seasonal allergies    History reviewed. No pertinent past surgical history. Family History  Problem Relation Age of Onset  . Diabetes Maternal Grandfather   . Hypertension Maternal Grandfather   . Hyperlipidemia Mother   . Hypertension Mother    History  Substance Use Topics  . Smoking status: Never Smoker   . Smokeless tobacco: Never Used  . Alcohol Use: 0.6 oz/week    1 Cans of beer per week     Comment: occasional   OB History   Grav Para Term Preterm Abortions TAB SAB Ect Mult Living   0              Review of Systems  Respiratory: Positive for cough.   Cardiovascular: Negative for chest pain.  All other systems reviewed and are negative.    Allergies  Minocycline  Home Medications   Current Outpatient Rx  Name  Route  Sig  Dispense  Refill  . MedroxyPROGESTERone Acetate (DEPO-PROVERA IM)   Intramuscular   Inject into the muscle.         Marland Kitchen azithromycin (ZITHROMAX) 250 MG tablet   Oral   Take 1 tablet (250 mg total) by mouth daily. Take first 2 tablets together, then 1 every day until finished.   6 tablet   0   . benzonatate (TESSALON) 100 MG capsule   Oral   Take 1  capsule (100 mg total) by mouth every 8 (eight) hours.   21 capsule   0   . cetirizine (ZYRTEC) 10 MG tablet   Oral   Take 10 mg by mouth daily.           . fexofenadine-pseudoephedrine (ALLEGRA-D) 60-120 MG per tablet   Oral   Take 1 tablet by mouth every 12 (twelve) hours.   14 tablet   0   . gentamicin (GARAMYCIN) 0.3 % ophthalmic ointment   Right Eye   Place into the right eye 3 (three) times daily.   3.5 g   0   . guaiFENesin-codeine (ROBITUSSIN AC) 100-10 MG/5ML syrup   Oral   Take 5 mLs by mouth 3 (three) times daily as needed for cough.   120 mL   0   . pseudoephedrine (SUDAFED) 120 MG 12 hr tablet   Oral   Take 120 mg by mouth every 12 (twelve) hours.         Marland Kitchen tobramycin (TOBREX) 0.3 % ophthalmic ointment   Left Eye   Place 1 application into the left eye 3 (three) times daily.   3.5 g   0    BP 142/92  Pulse 80  Temp(Src) 97.6 F (36.4 C) (Oral)  Resp 18  SpO2 98% Physical Exam  Nursing note and  vitals reviewed. Constitutional: She appears well-developed and well-nourished.  HENT:  Head: Normocephalic.  Right Ear: External ear normal.  Left Ear: External ear normal.  Nose: Nose normal.  Mouth/Throat: Oropharynx is clear and moist.  Eyes: EOM are normal.  Injected left conjunctiva  Neck: Normal range of motion.  Cardiovascular: Normal rate and normal heart sounds.   Pulmonary/Chest: Effort normal.  Abdominal: Soft.  Musculoskeletal: Normal range of motion.  Neurological: She is alert.  Skin: Skin is warm.    ED Course  Procedures (including critical care time) Labs Review Labs Reviewed - No data to display Imaging Review No results found.  EKG Interpretation    Date/Time:    Ventricular Rate:    PR Interval:    QRS Duration:   QT Interval:    QTC Calculation:   R Axis:     Text Interpretation:              MDM   1. Conjunctivitis, left eye   2. Bronchitis    Zithromax, tobrex and tessalon    Elson Areas, PA-C 03/24/13 1837  Lonia Skinner [place], [place] 03/24/13 1839

## 2013-03-25 NOTE — ED Notes (Signed)
Call from pharmacy, asking to substitute drops for ointment

## 2013-05-19 ENCOUNTER — Encounter: Payer: Self-pay | Admitting: Nurse Practitioner

## 2013-05-19 ENCOUNTER — Ambulatory Visit (INDEPENDENT_AMBULATORY_CARE_PROVIDER_SITE_OTHER): Payer: BC Managed Care – PPO | Admitting: Nurse Practitioner

## 2013-05-19 DIAGNOSIS — Z3049 Encounter for surveillance of other contraceptives: Secondary | ICD-10-CM

## 2013-05-19 DIAGNOSIS — R87619 Unspecified abnormal cytological findings in specimens from cervix uteri: Secondary | ICD-10-CM | POA: Insufficient documentation

## 2013-05-19 DIAGNOSIS — Z309 Encounter for contraceptive management, unspecified: Secondary | ICD-10-CM

## 2013-05-19 DIAGNOSIS — IMO0001 Reserved for inherently not codable concepts without codable children: Secondary | ICD-10-CM

## 2013-05-19 MED ORDER — MEDROXYPROGESTERONE ACETATE 104 MG/0.65ML ~~LOC~~ SUSP
104.0000 mg | Freq: Once | SUBCUTANEOUS | Status: AC
  Administered 2013-05-19: 104 mg via SUBCUTANEOUS

## 2013-05-19 NOTE — Addendum Note (Signed)
Addended by: Gerome ApleyZEYFANG, LINDA L on: 05/19/2013 03:52 PM   Modules accepted: Orders

## 2013-05-19 NOTE — Patient Instructions (Signed)
Abnormal Pap Test Information During a Pap test, the cells on the surface of your cervix are checked to see if they look normal, abnormal, or if they show signs of having been altered by a certain type of virus called human papillomavirus, or HPV. Cervical cells that have been affected by HPV are called dysplasia. Dysplasia is not cancer, but describes abnormal cells found on the surface of the cervix. Depending on the degree of dysplasia, some of the cells may be considered pre-cancerous and may turn into cancer over time if follow up with a caregiver is delayed.  WHAT DOES AN ABNORMAL PAP TEST MEAN? Having an abnormal pap test does not mean that you have cancer. However, certain types of abnormal pap tests can be a sign that a person is at a higher risk of developing cancer. Your caregiver will want to do other tests to find out more about the abnormal cells. Your abnormal Pap test results could show:   Small and uncertain changes that should be carefully watched.   Cervical dysplasia that has caused mild changes and can be followed over time.  Cervical dysplasia that is more severe and needs to be followed and treated to ensure the problem goes away.  Cancer.  When severe cervical dysplasia is found and treated early, it rarely will grow into cancer.  WHAT WILL BE DONE ABOUT MY ABNORMAL PAP TEST?  A colposcopy may be needed. This is a procedure where your cervix is examined using light and magnification.  A small tissue sample of your cervix (biopsy) may need to be removed and then examined. This is often performed if there are areas that appear infected.  A sample of cells from the cervical canal may be removed with either a small brush or scraping instrument (curette). Based on the results of the procedures above, some caregivers may recommend either cryotherapy of the cervix or a surgical LEEP where a portion of the cervix is removed. LEEP is short for "loop electrical excisional  procedure." Rarely, a caregiver may recommend a cone biopsy.This is a procedure where a small, cone-shaped sample of your cervix is taken out. The part that is taken out is the area where the abnormal cells are.  WHAT IF I HAVE A DYSPLASIA OR A CANCER? You may be referred to a specialist. Radiation may also be a treatment for more advanced cancer. Having a hysterectomy is the last treatment option for dysplasia, but it is a more common treatment for someone with cancer. All treatment options will be discussed with you by your caregiver. WHAT SHOULD YOU DO AFTER BEING TREATED? If you have had an abnormal pap test, you should continue to have regular pap tests and check-ups as directed by your caregiver. Your cervical problem will be carefully watched so it does not get worse. Also, your caregiver can watch for, and treat, any new problems that may come up. Document Released: 07/23/2010 Document Revised: 08/02/2012 Document Reviewed: 04/03/2011 [employer] Patient Information 2014 [employer].  

## 2013-05-19 NOTE — Progress Notes (Signed)
History:  Diane Stokes is a 23 y.o. G0P0 who [facility] smear. Her last pap was 02/2012 for ASCUS and she had been directed to have colposcopy. She was unable to afford and is now back in pap smear loop. She is also getting her depo provera today.  The following portions of the patient's history were reviewed and updated as appropriate: allergies, current medications, past family history, past medical history, past social history, past surgical history and problem list.  Review of Systems:  Pertinent items are noted in HPI.  Objective:  Physical Exam BP 135/99  Pulse 86  Ht 5\' 2"  (1.575 m)  Wt 244 lb 1.6 oz (110.723 kg)  BMI 44.64 kg/m2 GENERAL: Well-developed, well-nourished female in no acute distress.  HEENT: Normocephalic, atraumatic.  ABDOMEN: Soft, nontender, nondistended. No organomegaly. Normal bowel sounds appreciated in all quadrants. Morbid Obesity PELVIC: Normal external female genitalia. Vagina is pink and rugated.  Normal discharge. Normal cervix contour. Pap smear obtained. Uterus is normal in size. No adnexal mass or tenderness.  EXTREMITIES: No cyanosis, clubbing, or edema, 2+ distal pulses.   Labs and Imaging No results found.  Assessment & Plan:  Assessment:  Abnormal Pap Smear Morbid Obesity Contraception  Plans:  Repeat Pap Pt loosing weight Depo Provera 104 mg SQ today and q 12-13 weeks for one year Follow up 6 months to repeat pap  Delbert PhenixLinda M Francis Yardley, NP 05/19/2013 3:39 PM

## 2013-06-15 ENCOUNTER — Emergency Department: Payer: Self-pay | Admitting: Emergency Medicine

## 2013-08-10 ENCOUNTER — Ambulatory Visit: Payer: BC Managed Care – PPO

## 2013-08-11 ENCOUNTER — Ambulatory Visit (INDEPENDENT_AMBULATORY_CARE_PROVIDER_SITE_OTHER): Payer: BC Managed Care – PPO | Admitting: *Deleted

## 2013-08-11 VITALS — BP 132/92 | HR 88

## 2013-08-11 DIAGNOSIS — Z3049 Encounter for surveillance of other contraceptives: Secondary | ICD-10-CM

## 2013-08-11 MED ORDER — MEDROXYPROGESTERONE ACETATE 104 MG/0.65ML ~~LOC~~ SUSP
104.0000 mg | Freq: Once | SUBCUTANEOUS | Status: AC
  Administered 2013-08-11: 104 mg via SUBCUTANEOUS

## 2013-09-08 ENCOUNTER — Encounter (HOSPITAL_COMMUNITY): Payer: Self-pay | Admitting: Emergency Medicine

## 2013-09-08 ENCOUNTER — Emergency Department (HOSPITAL_COMMUNITY)
Admission: EM | Admit: 2013-09-08 | Discharge: 2013-09-08 | Disposition: A | Discharge status: Home / Self Care | Payer: BC Managed Care – PPO | Attending: Emergency Medicine | Admitting: Emergency Medicine

## 2013-09-08 DIAGNOSIS — S71109A Unspecified open wound, unspecified thigh, initial encounter: Principal | ICD-10-CM | POA: Insufficient documentation

## 2013-09-08 DIAGNOSIS — Y929 Unspecified place or not applicable: Secondary | ICD-10-CM | POA: Insufficient documentation

## 2013-09-08 DIAGNOSIS — S71009A Unspecified open wound, unspecified hip, initial encounter: Secondary | ICD-10-CM | POA: Insufficient documentation

## 2013-09-08 DIAGNOSIS — W540XXA Bitten by dog, initial encounter: Secondary | ICD-10-CM | POA: Insufficient documentation

## 2013-09-08 DIAGNOSIS — Z23 Encounter for immunization: Secondary | ICD-10-CM | POA: Insufficient documentation

## 2013-09-08 DIAGNOSIS — Z79899 Other long term (current) drug therapy: Secondary | ICD-10-CM | POA: Insufficient documentation

## 2013-09-08 DIAGNOSIS — Y9389 Activity, other specified: Secondary | ICD-10-CM | POA: Insufficient documentation

## 2013-09-08 MED ORDER — IBUPROFEN 800 MG PO TABS: 800.0000 mg | ORAL_TABLET | Freq: Three times a day (TID) | ORAL | Status: DC

## 2013-09-08 MED ORDER — AMOXICILLIN-POT CLAVULANATE 875-125 MG PO TABS: 1.0000 | ORAL_TABLET | Freq: Two times a day (BID) | ORAL | Status: DC

## 2013-09-08 MED ORDER — AMOXICILLIN-POT CLAVULANATE 875-125 MG PO TABS
1.0000 | ORAL_TABLET | Freq: Once | ORAL | Status: AC
  Administered 2013-09-08: 1 via ORAL
  Filled 2013-09-08: qty 1

## 2013-09-08 MED ORDER — RABIES VACCINE, PCEC IM SUSR
1.0000 mL | Freq: Once | INTRAMUSCULAR | Status: AC
  Administered 2013-09-08: 1 mL via INTRAMUSCULAR
  Filled 2013-09-08: qty 1

## 2013-09-08 MED ORDER — RABIES IMMUNE GLOBULIN 150 UNIT/ML IM INJ
20.0000 [IU]/kg | INJECTION | Freq: Once | INTRAMUSCULAR | Status: AC
  Administered 2013-09-08: 22:00:00 via INTRAMUSCULAR
  Filled 2013-09-08: qty 15

## 2013-09-08 NOTE — ED Notes (Signed)
Pt sts bite from "stray dog".  Noted 4 puncture wounds to Lt inner thigh with redness & bruising to site.  Also noted dermal tissue protruding from bottom teeth marks.

## 2013-09-08 NOTE — ED Provider Notes (Signed)
Medical screening examination/treatment/procedure(s) were performed by non-physician practitioner and as supervising physician I was immediately available for consultation/collaboration.    Linwood DibblesJon Marium Ragan, MD 09/08/13 2236

## 2013-09-08 NOTE — ED Provider Notes (Signed)
CSN: [id number]     Arrival date & time 09/08/13  2016 History   First MD Initiated Contact with Patient 09/08/13 2042     Chief Complaint  Patient presents with  . Animal Bite     (Consider location/radiation/quality/duration/timing/severity/associated sxs/prior Treatment) Patient is a 23 y.o. female presenting with animal bite. The history is provided by the patient and medical records. No language interpreter was used.  Animal Bite Associated symptoms: no fever and no numbness     Diane Stokes is a 23 y.o. female  with a hx of seasonal allergies presents to the Emergency Department complaining of acute dog bite to the left medial thigh onset approx 1.5 hours PTA.  Pt reports a stray dog began attacking her dog and she was bitten breaking up the fight. Associated symptoms include pain at the site, bleeding.  Pt reports the vaccination status of the dog is unknown.  She repots her last tetanus was in Dec 2014.  NO aggravating or alleviating factors.  Pt denies fever, chill, headache, neck pain, chest pain, SOB, back pain, abd pain, N/V/D, weakness, dizziness, syncope. Pt denies use of steroids, DM or immunocompromised states.    Past Medical History  Diagnosis Date  . Seasonal allergies    History reviewed. No pertinent past surgical history. Family History  Problem Relation Age of Onset  . Diabetes Maternal Grandfather   . Hypertension Maternal Grandfather   . Hyperlipidemia Mother   . Hypertension Mother    History  Substance Use Topics  . Smoking status: Never Smoker   . Smokeless tobacco: Never Used  . Alcohol Use: 0.6 oz/week    1 Cans of beer per week     Comment: occasional   OB History   Grav Para Term Preterm Abortions TAB SAB Ect Mult Living   0              Review of Systems  Constitutional: Negative for fever.  Gastrointestinal: Negative for nausea and vomiting.  Skin: Positive for wound.  Allergic/Immunologic: Negative for immunocompromised state.   Neurological: Negative for weakness and numbness.  Hematological: Does not bruise/bleed easily.  Psychiatric/Behavioral: The patient is not nervous/anxious.       Allergies  Minocycline  Home Medications   Prior to Admission medications   Medication Sig Start Date End Date Taking? Authorizing Provider  medroxyPROGESTERone (DEPO-SUBQ PROVERA) 104 MG/0.65ML injection Inject 104 mg into the skin every 3 (three) months.    Historical Provider, MD  Multiple Vitamin (MULTIVITAMIN) tablet Take 1 tablet by mouth daily.    Historical Provider, MD  omeprazole (PRILOSEC) 40 MG capsule Take 40 mg by mouth daily.    Historical Provider, MD   BP 148/101  Pulse 131  Temp(Src) 98.2 F (36.8 C) (Oral)  Resp 16  Ht 5\' 2"  (1.575 m)  Wt 250 lb (113.399 kg)  BMI 45.71 kg/m2  SpO2 100% Physical Exam  Nursing note and vitals reviewed. Constitutional: She is oriented to person, place, and time. She appears well-developed and well-nourished. No distress.  HENT:  Head: Normocephalic and atraumatic.  Eyes: Conjunctivae are normal. No scleral icterus.  Neck: Normal range of motion.  Cardiovascular: Normal rate, regular rhythm, normal heart sounds and intact distal pulses.   No murmur heard. Capillary refill < 3 sec  Pulmonary/Chest: Effort normal and breath sounds normal. No respiratory distress.  Musculoskeletal: Normal range of motion. She exhibits no edema.  ROM: Full ROM of all joints to the BLE  Neurological: She is  alert and oriented to person, place, and time.  Sensation: intact to dull and sharp Strength: 5/5 in the BLE  Skin: Skin is warm and dry. She is not diaphoretic.  3 puncture wounds to the medial left thigh 3cm laceration between the puncture wounds on the left medial thigh.    Psychiatric: She has a normal mood and affect.    ED Course  Debridement Date/Time: 09/08/2013 10:21 PM Performed by: Dierdre ForthMUTHERSBAUGH, Kaelan Emami Authorized by: Dierdre ForthMUTHERSBAUGH, Meshell Abdulaziz Consent: Verbal consent  obtained. Risks and benefits: risks, benefits and alternatives were discussed Consent given by: patient Patient understanding: patient states understanding of the procedure being performed Patient consent: the patient's understanding of the procedure matches consent given Procedure consent: procedure consent matches procedure scheduled Relevant documents: relevant documents present and verified Site marked: the operative site was marked Required items: required blood products, implants, devices, and special equipment available Patient identity confirmed: verbally with patient and arm band Time out: Immediately prior to procedure a "time out" was called to verify the correct patient, procedure, equipment, support staff and site/side marked as required. Preparation: Patient was prepped and draped in the usual sterile fashion. Local anesthesia used: yes Anesthesia: local infiltration Local anesthetic: lidocaine 1% without epinephrine Anesthetic total: 10 ml Patient sedated: no Patient tolerance: Patient tolerated the procedure well with no immediate complications. Comments: Debridement of left medial thigh laceration and aggressive washing of all wounds under local anesthesia.     (including critical care time) Labs Review Labs Reviewed - No data to display  Imaging Review No results found.   EKG Interpretation None      MDM   Final diagnoses:  Dog bite   Diane Stokes presents with dog bite to the left medial thigh.  Pt wounds irrigated well with 18ga angiocath with sterile saline.  Wounds examined with visualization of the base and no foreign bodies seen.  Pt Alert and oriented, NAD, nontoxic, nonseptic appearing.  Capillary refill intact and pt without neurologic deficit.  No x-rays indicated due to location of the bite.  Patient tetanus up to date.  Patient rabies vaccine and immunoglobulin given. Pain treated in the emergency department. Wounds not closed secondary to concern  for infection. We'll discharge home with pain medication, Augmentin and requests for close follow-up with PCP or back in the ER.      Diane ClientHannah Tor Tsuda, PA-C 09/08/13 2225

## 2013-09-08 NOTE — ED Notes (Signed)
Pt reports an unknown dog began attacking her dog, pt went out to hit it with a stick and the unknown dog bit her on the medial side of her L upper leg.  4 puncture wounds noted to pt's thigh. Pt is a&o x4.

## 2013-09-08 NOTE — Discharge Instructions (Signed)
1. Medications: augmentin, ibuprofen, usual home medications 2. Treatment: rest, drink plenty of fluids,  3. Follow Up: Please followup with your primary doctor, an urgent care or the ED for wound check in 2-3 days; if you do not have a primary care doctor use the resource guide provided to find one; return to the emergency department for fevers, erythema or purulent drainage   Animal Bite An animal bite can result in a scratch on the skin, deep open cut, puncture of the skin, crush injury, or tearing away of the skin or a body part. Dogs are responsible for most animal bites. Children are bitten more often than adults. An animal bite can range from very mild to more serious. A small bite from your house pet is no cause for alarm. However, some animal bites can become infected or injure a bone or other tissue. You must seek medical care if:  The skin is broken and bleeding does not slow down or stop after 15 minutes.  The puncture is deep and difficult to clean (such as a cat bite).  Pain, warmth, redness, or pus develops around the wound.  The bite is from a stray animal or rodent. There may be a risk of rabies infection.  The bite is from a snake, raccoon, skunk, fox, coyote, or bat. There may be a risk of rabies infection.  The person bitten has a chronic illness such as diabetes, liver disease, or cancer, or the person takes medicine that lowers the immune system.  There is concern about the location and severity of the bite. It is important to clean and protect an animal bite wound right away to prevent infection. Follow these steps:  Clean the wound with plenty of water and soap.  Apply an antibiotic cream.  Apply gentle pressure over the wound with a clean towel or gauze to slow or stop bleeding.  Elevate the affected area above the heart to help stop any bleeding.  Seek medical care. Getting medical care within 8 hours of the animal bite leads to the best possible  outcome. DIAGNOSIS  Your caregiver will most likely:  Take a detailed history of the animal and the bite injury.  Perform a wound exam.  Take your medical history. Blood tests or X-rays may be performed. Sometimes, infected bite wounds are cultured and sent to a lab to identify the infectious bacteria.  TREATMENT  Medical treatment will depend on the location and type of animal bite as well as the patient's medical history. Treatment may include:  Wound care, such as cleaning and flushing the wound with saline solution, bandaging, and elevating the affected area.  Antibiotics.  Tetanus immunization.  Rabies immunization.  Leaving the wound open to heal. This is often done with animal bites, due to the high risk of infection. However, in certain cases, wound closure with stitches, wound adhesive, skin adhesive strips, or staples may be used. Infected bites that are left untreated may require intravenous (IV) antibiotics and surgical treatment in the hospital. HOME CARE INSTRUCTIONS  Follow your caregiver's instructions for wound care.  Take all medicines as directed.  If your caregiver prescribes antibiotics, take them as directed. Finish them even if you start to feel better.  Follow up with your caregiver for further exams or immunizations as directed. You may need a tetanus shot if:  You cannot remember when you had your last tetanus shot.  You have never had a tetanus shot.  The injury broke your skin. If you  get a tetanus shot, your arm may swell, get red, and feel warm to the touch. This is common and not a problem. If you need a tetanus shot and you choose not to have one, there is a rare chance of getting tetanus. Sickness from tetanus can be serious. SEEK MEDICAL CARE IF:  You notice warmth, redness, soreness, swelling, pus discharge, or a bad smell coming from the wound.  You have a red line on the skin coming from the wound.  You have a fever, chills, or a  general ill feeling.  You have nausea or vomiting.  You have continued or worsening pain.  You have trouble moving the injured part.  You have other questions or concerns. MAKE SURE YOU:  Understand these instructions.  Will watch your condition.  Will get help right away if you are not doing well or get worse. Document Released: 12/24/2010 Document Revised: 06/30/2011 Document Reviewed: 12/24/2010 [facility] Patient Information 2014 [employer] [place].

## 2013-11-02 ENCOUNTER — Ambulatory Visit: Payer: BC Managed Care – PPO

## 2013-12-27 ENCOUNTER — Ambulatory Visit (INDEPENDENT_AMBULATORY_CARE_PROVIDER_SITE_OTHER): Payer: BC Managed Care – PPO | Admitting: Obstetrics & Gynecology

## 2013-12-27 ENCOUNTER — Encounter: Payer: Self-pay | Admitting: Obstetrics & Gynecology

## 2013-12-27 VITALS — BP 127/84 | HR 90 | Ht 61.0 in | Wt 255.0 lb

## 2013-12-27 DIAGNOSIS — Z3009 Encounter for other general counseling and advice on contraception: Secondary | ICD-10-CM

## 2013-12-27 DIAGNOSIS — IMO0001 Reserved for inherently not codable concepts without codable children: Secondary | ICD-10-CM | POA: Insufficient documentation

## 2013-12-27 DIAGNOSIS — Z23 Encounter for immunization: Secondary | ICD-10-CM | POA: Diagnosis not present

## 2013-12-27 MED ORDER — MEDROXYPROGESTERONE ACETATE 150 MG/ML IM SUSP
150.0000 mg | Freq: Once | INTRAMUSCULAR | Status: AC
  Administered 2013-12-27: 150 mg via INTRAMUSCULAR

## 2013-12-27 NOTE — Progress Notes (Signed)
   Subjective:    Patient ID: Diane Stokes, female    DOB: [birth year], 23 y.o.   MRN: [id number]  HPI  23 yo S W G0 here today because she wants to restart depo provera. She has been on it for about 4 years but had to stop using it because she lost her insurance. Now that she has insurance, she wants to restart depo provera. She tends to lose weight with the medicine.  Review of Systems She declines a referral to bariatrics. She does want a flu vaccine today.    Objective:   Physical Exam        Assessment & Plan:  Contraception- depo provera today. Back up for 2 weeks

## 2014-01-09 ENCOUNTER — Ambulatory Visit: Payer: BC Managed Care – PPO | Admitting: Obstetrics & Gynecology

## 2014-03-23 ENCOUNTER — Ambulatory Visit (INDEPENDENT_AMBULATORY_CARE_PROVIDER_SITE_OTHER): Payer: BC Managed Care – PPO | Admitting: *Deleted

## 2014-03-23 DIAGNOSIS — Z3042 Encounter for surveillance of injectable contraceptive: Secondary | ICD-10-CM | POA: Diagnosis not present

## 2014-03-23 MED ORDER — MEDROXYPROGESTERONE ACETATE 150 MG/ML IM SUSP
150.0000 mg | INTRAMUSCULAR | Status: DC
  Administered 2014-03-23 – 2015-08-16 (×5): 150 mg via INTRAMUSCULAR

## 2014-06-23 ENCOUNTER — Ambulatory Visit (INDEPENDENT_AMBULATORY_CARE_PROVIDER_SITE_OTHER): Payer: BLUE CROSS/BLUE SHIELD | Admitting: *Deleted

## 2014-06-23 DIAGNOSIS — Z3042 Encounter for surveillance of injectable contraceptive: Secondary | ICD-10-CM | POA: Diagnosis not present

## 2014-09-15 ENCOUNTER — Ambulatory Visit (INDEPENDENT_AMBULATORY_CARE_PROVIDER_SITE_OTHER): Payer: BLUE CROSS/BLUE SHIELD | Admitting: *Deleted

## 2014-09-15 DIAGNOSIS — Z3042 Encounter for surveillance of injectable contraceptive: Secondary | ICD-10-CM | POA: Diagnosis not present

## 2014-09-15 MED ORDER — MEDROXYPROGESTERONE ACETATE 150 MG/ML IM SUSP: 150.0000 mg | INTRAMUSCULAR | Status: DC

## 2014-09-15 NOTE — Addendum Note (Signed)
Addended by: Barbara CowerNOGUES, Mahamadou Weltz L on: 09/15/2014 09:24 AM   Modules accepted: Orders

## 2014-09-21 ENCOUNTER — Ambulatory Visit: Payer: BLUE CROSS/BLUE SHIELD

## 2014-11-27 ENCOUNTER — Encounter (HOSPITAL_COMMUNITY): Payer: Self-pay | Admitting: Emergency Medicine

## 2014-11-27 ENCOUNTER — Emergency Department (INDEPENDENT_AMBULATORY_CARE_PROVIDER_SITE_OTHER)
Admission: EM | Admit: 2014-11-27 | Discharge: 2014-11-27 | Disposition: A | Discharge status: Home / Self Care | Payer: Self-pay | Source: Home / Self Care

## 2014-11-27 DIAGNOSIS — J302 Other seasonal allergic rhinitis: Secondary | ICD-10-CM

## 2014-11-27 DIAGNOSIS — J0101 Acute recurrent maxillary sinusitis: Secondary | ICD-10-CM

## 2014-11-27 MED ORDER — FLUCONAZOLE 150 MG PO TABS: 150.0000 mg | ORAL_TABLET | Freq: Every day | ORAL | Status: DC

## 2014-11-27 MED ORDER — AMOXICILLIN-POT CLAVULANATE 875-125 MG PO TABS: 1.0000 | ORAL_TABLET | Freq: Two times a day (BID) | ORAL | Status: DC

## 2014-11-27 MED ORDER — FLUTICASONE PROPIONATE 50 MCG/ACT NA SUSP: 2.0000 | Freq: Every day | NASAL | Status: DC

## 2014-11-27 MED ORDER — IPRATROPIUM BROMIDE 0.06 % NA SOLN: 2.0000 | Freq: Four times a day (QID) | NASAL | Status: DC

## 2014-11-27 NOTE — ED Provider Notes (Signed)
CSN: [id number]     Arrival date & time 11/27/14  1627 History   None    Chief Complaint  Patient presents with  . Sinusitis  . Emesis   (Consider location/radiation/quality/duration/timing/severity/associated sxs/prior Treatment) HPI  2 weeks of stuffy nasal congestion. Not getting better. Sudafed w/o benefit. Intermittent runny nose and sinus stuffiness. Emesis x2 today of food products. Symptoms are intermittent. General ill feeling. Deneis fevers and chills, chest pain, shortness breath, palpitations, nausea, diarrhea, constipation, neck stiffness, syncope. Occasional headache. I did provide him with improvement of the headache.  Past Medical History  Diagnosis Date  . Seasonal allergies    History reviewed. No pertinent past surgical history. Family History  Problem Relation Age of Onset  . Diabetes Maternal Grandfather   . Hypertension Maternal Grandfather   . Hyperlipidemia Mother   . Hypertension Mother    History  Substance Use Topics  . Smoking status: Never Smoker   . Smokeless tobacco: Never Used  . Alcohol Use: 0.6 oz/week    1 Cans of beer per week     Comment: occasional   OB History    Gravida Para Term Preterm AB TAB SAB Ectopic Multiple Living   0              Review of Systems Per HPI with all other pertinent systems negative.   Allergies  Minocycline  Home Medications   Prior to Admission medications   Medication Sig Start Date End Date Taking? Authorizing Provider  amoxicillin-clavulanate (AUGMENTIN) 875-125 MG per tablet Take 1 tablet by mouth 2 (two) times daily. 11/27/14   Ozella Rocks, MD  fluconazole (DIFLUCAN) 150 MG tablet Take 1 tablet (150 mg total) by mouth daily. Repeat dose in 3 days 11/27/14   Ozella Rocks, MD  fluticasone [facility]) 50 MCG/ACT nasal spray Place 2 sprays into both nostrils at bedtime. 11/27/14   Ozella Rocks, MD  ipratropium (ATROVENT) 0.06 % nasal spray Place 2 sprays into both nostrils 4 (four) times daily.  11/27/14   Ozella Rocks, MD  medroxyPROGESTERone (DEPO-PROVERA) 150 MG/ML injection Inject 1 mL (150 mg total) into the muscle every 3 (three) months. 09/15/14   Allie Bossier, MD  medroxyPROGESTERone (DEPO-SUBQ PROVERA) 104 MG/0.65ML injection Inject 104 mg into the skin every 3 (three) months.    Historical Provider, MD  Multiple Vitamin (MULTIVITAMIN) tablet Take 1 tablet by mouth every morning.     Historical Provider, MD   BP 131/52 mmHg  Pulse 86  Temp(Src) 99 F (37.2 C) (Oral)  Resp 16  SpO2 100% Physical Exam Physical Exam  Constitutional: oriented to person, place, and time. appears well-developed and well-nourished. No distress.  HENT:  Head: Normocephalic and atraumatic.  Eyes: EOMI. PERRL.  Neck: Normal range of motion.  Maxillary sinus tenderness to palpation left and right, mild anterior cervical lymphadenopathy bilaterally.  Cardiovascular: RRR, no m/r/g, 2+ distal pulses,  Pulmonary/Chest: Effort normal and breath sounds normal. No respiratory distress.  Abdominal: Soft. Bowel sounds are normal. NonTTP, no distension.  Musculoskeletal: Normal range of motion. Non ttp, no effusion.  Neurological: alert and oriented to person, place, and time.  Skin: Skin is warm. No rash noted. non diaphoretic.  Psychiatric: normal mood and affect. behavior is normal. Judgment and thought content normal.   ED Course  Procedures (including critical care time) Labs Review Labs Reviewed - No data to display  Imaging Review No results found.   MDM   1. Seasonal allergies  2. Acute recurrent maxillary sinusitis    Nasal Atrovent, Flonase, NSAIDs, Sudafed, daily allergy medicine. Improving start Augmentin. Follow-up with primary care physician. Diflucan if develops [place] infection.   Ozella Rocks, MD 11/27/14 [phone]

## 2014-11-27 NOTE — ED Notes (Signed)
Pt states she has been suffering from a sinus infection for about two weeks.  Pt reports vomiting x2 today.  She denies a fever at home.

## 2014-11-27 NOTE — Discharge Instructions (Signed)
Likely developed sinus infection which started out as basic allergies and sinus irritation. Please use the nasal Atrovent for runny nose, please use the Flonase at night to help with inflammation in your sinuses and congestion. Please continue the Sudafed. Please start using ibuprofen 600 mg every 6 hours for additional pain and inflammation. Please also consider starting a nightly allergy pill. Start the Augmentin he developed worsening symptoms after another couple of days on the other medicines. Please use the Diflucan if you develop these infection.

## 2014-12-11 ENCOUNTER — Ambulatory Visit (INDEPENDENT_AMBULATORY_CARE_PROVIDER_SITE_OTHER): Payer: Self-pay | Admitting: *Deleted

## 2014-12-11 DIAGNOSIS — Z3042 Encounter for surveillance of injectable contraceptive: Secondary | ICD-10-CM

## 2015-01-18 ENCOUNTER — Ambulatory Visit: Payer: Self-pay

## 2015-03-08 ENCOUNTER — Other Ambulatory Visit (INDEPENDENT_AMBULATORY_CARE_PROVIDER_SITE_OTHER): Payer: BLUE CROSS/BLUE SHIELD | Admitting: *Deleted

## 2015-03-08 DIAGNOSIS — Z01812 Encounter for preprocedural laboratory examination: Secondary | ICD-10-CM | POA: Diagnosis not present

## 2015-03-08 DIAGNOSIS — Z3042 Encounter for surveillance of injectable contraceptive: Secondary | ICD-10-CM | POA: Diagnosis not present

## 2015-03-08 DIAGNOSIS — N644 Mastodynia: Secondary | ICD-10-CM

## 2015-03-08 LAB — POCT URINE PREGNANCY: Preg Test, Ur: NEGATIVE

## 2015-03-08 NOTE — Progress Notes (Signed)
Pt here for Depo injection, noted last pap was 04/2013 and was abnormal.  Instructed pt to schedule appointment for physical and pap smear.  Pt acknowledged.

## 2015-03-16 ENCOUNTER — Ambulatory Visit: Payer: Self-pay

## 2015-03-21 ENCOUNTER — Encounter: Payer: Self-pay | Admitting: Certified Nurse Midwife

## 2015-03-21 ENCOUNTER — Ambulatory Visit (INDEPENDENT_AMBULATORY_CARE_PROVIDER_SITE_OTHER): Payer: BLUE CROSS/BLUE SHIELD | Admitting: Certified Nurse Midwife

## 2015-03-21 VITALS — BP 122/75 | HR 89 | Resp 18 | Ht 62.5 in | Wt 240.0 lb

## 2015-03-21 DIAGNOSIS — Z01419 Encounter for gynecological examination (general) (routine) without abnormal findings: Secondary | ICD-10-CM | POA: Diagnosis not present

## 2015-03-21 DIAGNOSIS — Z3042 Encounter for surveillance of injectable contraceptive: Secondary | ICD-10-CM

## 2015-03-21 DIAGNOSIS — Z124 Encounter for screening for malignant neoplasm of cervix: Secondary | ICD-10-CM | POA: Diagnosis not present

## 2015-03-21 DIAGNOSIS — Z1151 Encounter for screening for human papillomavirus (HPV): Secondary | ICD-10-CM | POA: Diagnosis not present

## 2015-03-21 MED ORDER — IBUPROFEN 800 MG PO TABS: 800.0000 mg | ORAL_TABLET | Freq: Three times a day (TID) | ORAL | Status: DC | PRN

## 2015-03-21 NOTE — Patient Instructions (Signed)

## 2015-03-21 NOTE — Progress Notes (Signed)
Pt does not have a menstrual cycle but experiences severe cramping throughout the month at times, requesting rx for Ibuprofen to be sent to pharmacy.

## 2015-03-21 NOTE — Addendum Note (Signed)
Addended by: Gita KudoLASSITER, KRISTEN S on: 03/21/2015 04:40 PM   Modules accepted: Orders

## 2015-03-21 NOTE — Progress Notes (Signed)
Patient ID: Diane Stokes, female   DOB: [birth year], 24 y.o.   MRN: [id number]    GYNECOLOGY CLINIC ANNUAL PREVENTATIVE CARE ENCOUNTER NOTE  Subjective:   Diane Stokes is a 24 y.o. G0P0 female here for a routine annual gynecologic exam.  Current complaints: cramping during the month.   Denies abnormal vaginal bleeding, discharge, pelvic pain, problems with intercourse or other gynecologic concerns.    Gynecologic History No LMP recorded. Patient has had an injection. Contraception: Depo-Provera injections Obstetric History OB History  Gravida Para Term Preterm AB SAB TAB Ectopic Multiple Living  0                 Past Medical History  Diagnosis Date  . Seasonal allergies     History reviewed. No pertinent past surgical history.  Current Outpatient Prescriptions on File Prior to Visit  Medication Sig Dispense Refill  . fluticasone (FLONASE) 50 MCG/ACT nasal spray Place 2 sprays into both nostrils at bedtime. 16 g 0  . ipratropium (ATROVENT) 0.06 % nasal spray Place 2 sprays into both nostrils 4 (four) times daily. 15 mL 12  . medroxyPROGESTERone (DEPO-PROVERA) 150 MG/ML injection Inject 1 mL (150 mg total) into the muscle every 3 (three) months. 1 mL 0  . Multiple Vitamin (MULTIVITAMIN) tablet Take 1 tablet by mouth every morning.      Current Facility-Administered Medications on File Prior to Visit  Medication Dose Route Frequency Provider Last Rate Last Dose  . medroxyPROGESTERone (DEPO-PROVERA) injection 150 mg  150 mg Intramuscular Q90 days Allie BossierMyra C Dove, MD   150 mg at 03/08/15 1607    Allergies  Allergen Reactions  . Minocycline Hives  . Diflucan [Fluconazole] Rash    Social History   Social History  . Marital Status: Single    Spouse Name: N/A  . Number of Children: N/A  . Years of Education: N/A   Occupational History  . Not on file.   Social History Main Topics  . Smoking status: Never Smoker   . Smokeless tobacco: Never Used  . Alcohol Use: 0.6 oz/week     1 Cans of beer per week     Comment: occasional  . Drug Use: No  . Sexual Activity: Yes    Birth Control/ Protection: Injection   Other Topics Concern  . Not on file   Social History Narrative    Family History  Problem Relation Age of Onset  . Diabetes Maternal Grandfather   . Hypertension Maternal Grandfather   . Hyperlipidemia Mother   . Hypertension Mother     The following portions of the patient's history were reviewed and updated as appropriate: allergies, current medications, past family history, past medical history, past social history, past surgical history and problem list.  Review of Systems Pertinent items are noted in HPI.   Objective:  BP 122/75 mmHg  Pulse 89  Resp 18  Ht 5' 2.5" (1.588 m)  Wt 240 lb (108.863 kg)  BMI 43.17 kg/m2 CONSTITUTIONAL: Well-developed, well-nourished female in no acute distress.  HENT:  Normocephalic, atraumatic, External right and left ear normal. Oropharynx is clear and moist EYES: Conjunctivae and EOM are normal. Pupils are equal, round, and reactive to light. No scleral icterus.  NECK: Normal range of motion, supple, no masses.  Normal thyroid.  SKIN: Skin is warm and dry. No rash noted. Not diaphoretic. No erythema. No pallor. NEUROLGIC: Alert and oriented to person, place, and time. Normal reflexes, muscle tone coordination. No cranial nerve deficit noted.  PSYCHIATRIC: Normal mood and affect. Normal behavior. Normal judgment and thought content. CARDIOVASCULAR: Normal heart rate noted, regular rhythm RESPIRATORY: Clear to auscultation bilaterally. Effort and breath sounds normal, no problems with respiration noted. BREASTS: Symmetric in size. No masses, skin changes, nipple drainage, or lymphadenopathy. ABDOMEN: Soft, normal bowel sounds, no distention noted.  No tenderness, rebound or guarding.  PELVIC: Normal appearing external genitalia; normal appearing vaginal mucosa and cervix.  No abnormal discharge noted.  Pap  smear obtained.  Normal uterine size, no other palpable masses, no uterine or adnexal tenderness. MUSCULOSKELETAL: Normal range of motion. No tenderness.  No cyanosis, clubbing, or edema.  2+ distal pulses.   Assessment:  Annual gynecologic examination with pap smear   Plan:  Will follow up results of pap smear and manage accordingly. Motrin  Po  Routine preventative health maintenance measures emphasized. Please refer to After Visit Summary for other counseling recommendations.   Illene Bolus CNM [facility] [employer]

## 2015-03-23 LAB — CYTOLOGY - PAP

## 2015-04-02 ENCOUNTER — Telehealth: Payer: Self-pay | Admitting: *Deleted

## 2015-04-02 NOTE — Telephone Encounter (Signed)
-----   Message from Lori A Clemmons, CNM sent at 03/28/2015 11:27 AM EST ----- This is the pt that needs a colpo!! 

## 2015-04-02 NOTE — Telephone Encounter (Signed)
Called pt, no answer, left message to call office.

## 2015-04-04 ENCOUNTER — Telehealth: Payer: Self-pay | Admitting: *Deleted

## 2015-04-04 NOTE — Telephone Encounter (Signed)
Informed pt of abnormal pap smear and recommendation for Colposcopy, scheduled colpo for 04-02-15 at 1045.

## 2015-04-04 NOTE — Telephone Encounter (Signed)
-----   Message from Rhea PinkLori A Clemmons, CNM sent at 03/28/2015 11:27 AM EST ----- This is the pt that needs a colpo!!

## 2015-04-11 ENCOUNTER — Encounter: Payer: BLUE CROSS/BLUE SHIELD | Admitting: Obstetrics & Gynecology

## 2015-04-11 DIAGNOSIS — R87619 Unspecified abnormal cytological findings in specimens from cervix uteri: Secondary | ICD-10-CM

## 2015-04-24 ENCOUNTER — Ambulatory Visit (INDEPENDENT_AMBULATORY_CARE_PROVIDER_SITE_OTHER): Payer: BLUE CROSS/BLUE SHIELD | Admitting: Obstetrics & Gynecology

## 2015-04-24 ENCOUNTER — Encounter: Payer: Self-pay | Admitting: Obstetrics & Gynecology

## 2015-04-24 VITALS — BP 127/84 | HR 96 | Resp 18 | Wt 240.0 lb

## 2015-04-24 DIAGNOSIS — R87810 Cervical high risk human papillomavirus (HPV) DNA test positive: Secondary | ICD-10-CM | POA: Diagnosis not present

## 2015-04-24 DIAGNOSIS — R87610 Atypical squamous cells of undetermined significance on cytologic smear of cervix (ASC-US): Secondary | ICD-10-CM | POA: Diagnosis not present

## 2015-04-24 DIAGNOSIS — Z01812 Encounter for preprocedural laboratory examination: Secondary | ICD-10-CM | POA: Diagnosis not present

## 2015-04-24 DIAGNOSIS — IMO0002 Reserved for concepts with insufficient information to code with codable children: Secondary | ICD-10-CM

## 2015-04-24 LAB — POCT URINE PREGNANCY: Preg Test, Ur: NEGATIVE

## 2015-04-24 NOTE — Progress Notes (Signed)
   Subjective:    Patient ID: Diane Stokes, female    DOB: Jan 24, 1991, 25 y.o.   MRN: [id number]  HPI 25 yo SW G0 here for a colpo due to a ASCUS + HR HPV pap. She has had this sort of pap since 2012 and has put off having a colpo until now.  Review of Systems She and her boyfriend are considering a pregnancy. She is taking a MVI daily. She tells me that she has had the Gardasil series.    Objective:   Physical Exam  WNWHWFNAD UPT negative, consent signed, time out done Cervix prepped with acetic acid. Transformation zone seen in its entirety. Colpo adequate. Her colpo was entirely normal ECC obtained. She tolerated the procedure well.        Assessment & Plan:  Recurrent ASCUS paps If her ECC is negative, then rec pap with cotesting in a year.

## 2015-04-26 ENCOUNTER — Encounter: Payer: Self-pay | Admitting: *Deleted

## 2015-05-05 ENCOUNTER — Emergency Department (HOSPITAL_COMMUNITY)
Admission: EM | Admit: 2015-05-05 | Discharge: 2015-05-06 | Disposition: A | Discharge status: Home / Self Care | Payer: BLUE CROSS/BLUE SHIELD | Attending: Emergency Medicine | Admitting: Emergency Medicine

## 2015-05-05 ENCOUNTER — Encounter (HOSPITAL_COMMUNITY): Payer: Self-pay | Admitting: Emergency Medicine

## 2015-05-05 DIAGNOSIS — Z79899 Other long term (current) drug therapy: Secondary | ICD-10-CM | POA: Diagnosis not present

## 2015-05-05 DIAGNOSIS — Z3202 Encounter for pregnancy test, result negative: Secondary | ICD-10-CM | POA: Insufficient documentation

## 2015-05-05 DIAGNOSIS — Z7951 Long term (current) use of inhaled steroids: Secondary | ICD-10-CM | POA: Diagnosis not present

## 2015-05-05 DIAGNOSIS — N939 Abnormal uterine and vaginal bleeding, unspecified: Secondary | ICD-10-CM | POA: Insufficient documentation

## 2015-05-05 DIAGNOSIS — Z9889 Other specified postprocedural states: Secondary | ICD-10-CM | POA: Insufficient documentation

## 2015-05-05 DIAGNOSIS — Z8619 Personal history of other infectious and parasitic diseases: Secondary | ICD-10-CM | POA: Diagnosis not present

## 2015-05-05 HISTORY — DX: Papillomavirus as the cause of diseases classified elsewhere: B97.7

## 2015-05-05 LAB — BASIC METABOLIC PANEL
Anion gap: 9 (ref 5–15)
BUN: 14 mg/dL (ref 6–20)
CHLORIDE: 107 mmol/L (ref 101–111)
CO2: 24 mmol/L (ref 22–32)
CREATININE: 0.86 mg/dL (ref 0.44–1.00)
Calcium: 9.4 mg/dL (ref 8.9–10.3)
GFR calc Af Amer: >60 mL/min (ref >60)
GFR calc non Af Amer: >60 mL/min (ref >60)
Glucose, Bld: 88 mg/dL (ref 65–99)
POTASSIUM: 4.1 mmol/L (ref 3.5–5.1)
SODIUM: 140 mmol/L (ref 135–145)

## 2015-05-05 LAB — CBC WITH DIFFERENTIAL/PLATELET
Basophils Absolute: 0 10*3/uL (ref 0.0–0.1)
Basophils Relative: 0 %
EOS ABS: 0.2 10*3/uL (ref 0.0–0.7)
EOS PCT: 3 %
HCT: 42.9 % (ref 36.0–46.0)
Hemoglobin: 15 g/dL (ref 12.0–15.0)
LYMPHS PCT: 26 %
Lymphs Abs: 2.1 10*3/uL (ref 0.7–4.0)
MCH: 31.8 pg (ref 26.0–34.0)
MCHC: 35 g/dL (ref 30.0–36.0)
MCV: 90.9 fL (ref 78.0–100.0)
MONOS PCT: 10 %
Monocytes Absolute: 0.8 10*3/uL (ref 0.1–1.0)
Neutro Abs: 4.9 10*3/uL (ref 1.7–7.7)
Neutrophils Relative %: 61 %
PLATELETS: 239 10*3/uL (ref 150–400)
RBC: 4.72 MIL/uL (ref 3.87–5.11)
RDW: 12 % (ref 11.5–15.5)
WBC: 8 10*3/uL (ref 4.0–10.5)

## 2015-05-05 LAB — URINALYSIS, ROUTINE W REFLEX MICROSCOPIC
Bilirubin Urine: NEGATIVE
GLUCOSE, UA: NEGATIVE mg/dL
Ketones, ur: NEGATIVE mg/dL
Nitrite: NEGATIVE
Protein, ur: NEGATIVE mg/dL
SPECIFIC GRAVITY, URINE: 1.034 — AB (ref 1.005–1.030)
pH: 6 (ref 5.0–8.0)

## 2015-05-05 LAB — WET PREP, GENITAL
CLUE CELLS WET PREP: NONE SEEN
Sperm: NONE SEEN
Trich, Wet Prep: NONE SEEN
Yeast Wet Prep HPF POC: NONE SEEN

## 2015-05-05 LAB — URINE MICROSCOPIC-ADD ON

## 2015-05-05 LAB — POC URINE PREG, ED: Preg Test, Ur: NEGATIVE

## 2015-05-05 NOTE — ED Provider Notes (Signed)
CSN: [id number]     Arrival date & time 05/05/15  2207 History   First MD Initiated Contact with Patient 05/05/15 2236     Chief Complaint  Patient presents with  . Vaginal Bleeding    HPI   Diane Stokes is a 25 y.o. female with a PMH of HPV who presents to the ED with vaginal bleeding. She states she had a colposcopy on January 3, and had some spotting after the procedure, which eventually stopped. She reports today she noticed she spotted dark brown blood and had some small blood clots. She denies exacerbating or alleviating factors. She also reports lower abdominal cramping. She states she uses depo shots and has not had a menstrual cycle in the last 8 years. She denies fever, chills, nausea, vomiting, diarrhea, constipation, dysuria, urgency, frequency, vaginal discharge.   Past Medical History  Diagnosis Date  . Seasonal allergies   . HPV (human papilloma virus) infection    No past surgical history on file. Family History  Problem Relation Age of Onset  . Diabetes Maternal Grandfather   . Hypertension Maternal Grandfather   . Hyperlipidemia Mother   . Hypertension Mother    Social History  Substance Use Topics  . Smoking status: Never Smoker   . Smokeless tobacco: Never Used  . Alcohol Use: 0.6 oz/week    1 Cans of beer per week     Comment: occasional   OB History    Gravida Para Term Preterm AB TAB SAB Ectopic Multiple Living   0               Review of Systems  Constitutional: Negative for fever and chills.  Gastrointestinal: Positive for abdominal pain. Negative for nausea, vomiting, diarrhea and constipation.  Genitourinary: Positive for vaginal bleeding. Negative for dysuria, urgency, frequency and vaginal discharge.  All other systems reviewed and are negative.     Allergies  Minocycline and Diflucan  Home Medications   Prior to Admission medications   Medication Sig Start Date End Date Taking? Authorizing Provider  fluticasone (FLONASE) 50 MCG/ACT  nasal spray Place 2 sprays into both nostrils at bedtime. 11/27/14  Yes Ozella Rocks, MD  ibuprofen (ADVIL,MOTRIN) 800 MG tablet Take 1 tablet (800 mg total) by mouth every 8 (eight) hours as needed. 03/21/15  Yes Lori A Clemmons, CNM  medroxyPROGESTERone (DEPO-PROVERA) 150 MG/ML injection Inject 1 mL (150 mg total) into the muscle every 3 (three) months. 09/15/14  Yes Allie Bossier, MD  Multiple Vitamin (MULTIVITAMIN) tablet Take 1 tablet by mouth every morning.    Yes Historical Provider, MD  ipratropium (ATROVENT) 0.06 % nasal spray Place 2 sprays into both nostrils 4 (four) times daily. Patient not taking: Reported on 05/06/2015 11/27/14   Ozella Rocks, MD    BP 129/85 mmHg  Pulse 97  Temp(Src) 97.8 F (36.6 C) (Oral)  Resp 18  Ht 5\' 2"  (1.575 m)  Wt 109.317 kg  BMI 44.07 kg/m2  SpO2 98% Physical Exam  Constitutional: She is oriented to person, place, and time. She appears well-developed and well-nourished. No distress.  HENT:  Head: Normocephalic and atraumatic.  Right Ear: External ear normal.  Left Ear: External ear normal.  Nose: Nose normal.  Mouth/Throat: Uvula is midline, oropharynx is clear and moist and mucous membranes are normal.  Eyes: Conjunctivae, EOM and lids are normal. Pupils are equal, round, and reactive to light. Right eye exhibits no discharge. Left eye exhibits no discharge. No scleral icterus.  Neck: Normal range of motion. Neck supple.  Cardiovascular: Normal rate, regular rhythm, normal heart sounds, intact distal pulses and normal pulses.   Pulmonary/Chest: Effort normal and breath sounds normal. No respiratory distress. She has no wheezes. She has no rales.  Abdominal: Soft. Normal appearance and bowel sounds are normal. She exhibits no distension and no mass. There is tenderness. There is no rigidity, no rebound and no guarding.  Minimal TTP in suprapubic region.  Genitourinary: Cervix exhibits no motion tenderness, no discharge and no friability. Right  adnexum displays no mass, no tenderness and no fullness. Left adnexum displays no mass, no tenderness and no fullness. There is bleeding in the vagina. No tenderness in the vagina. No foreign body around the vagina. No vaginal discharge found.  Small amount of brown blood present in vaginal vault. No active bleeding. No cervical motion tenderness or cervical friability. No tenderness to palpation on bimanual exam.  Musculoskeletal: Normal range of motion. She exhibits no edema or tenderness.  Neurological: She is alert and oriented to person, place, and time. She has normal strength. No sensory deficit.  Skin: Skin is warm, dry and intact. No rash noted. She is not diaphoretic. No erythema. No pallor.  Psychiatric: She has a normal mood and affect. Her speech is normal and behavior is normal.  Nursing note and vitals reviewed.   ED Course  Procedures (including critical care time)  Labs Review Labs Reviewed  WET PREP, GENITAL - Abnormal; Notable for the following:    WBC, Wet Prep HPF POC RARE (*)    All other components within normal limits  URINALYSIS, ROUTINE W REFLEX MICROSCOPIC (NOT AT [facility]) - Abnormal; Notable for the following:    APPearance CLOUDY (*)    Specific Gravity, Urine 1.034 (*)    Hgb urine dipstick LARGE (*)    Leukocytes, UA TRACE (*)    All other components within normal limits  URINE MICROSCOPIC-ADD ON - Abnormal; Notable for the following:    Squamous Epithelial / LPF 0-5 (*)    Bacteria, UA FEW (*)    Crystals CA OXALATE CRYSTALS (*)    All other components within normal limits  CBC WITH DIFFERENTIAL/PLATELET  BASIC METABOLIC PANEL  POC URINE PREG, ED  GC/CHLAMYDIA PROBE AMP () NOT AT [facility]    Imaging Review No results found.   I have personally reviewed and evaluated these lab results as part of my medical decision-making.   EKG Interpretation None      MDM   Final diagnoses:  Vaginal bleeding    25 year old female presents with  vaginal bleeding. States she had a colposcopy 1/3, had some mild spotting afterward, which initially resolved, however states she had spots of dark brown blood with blood clots today. Also reports lower abdominal cramping.  Patient is afebrile. Vital signs stable. Minimal tenderness to palpation in suprapubic region. Small amount of brown blood present in vaginal vault. No active bleeding. No cervical motion tenderness or cervical friability. No tenderness on bimanual exam.  CBC negative for leukocytosis or anemia. BMP unremarkable. Urine pregnancy negative. Urinalysis negative for infection. Wet prep with rare WBC.   Patient is nontoxic and well-appearing, feel she is stable for discharge at this time. Symptoms likely related to colposcopy. Patient to follow-up with gynecologist for further evaluation and management. Return precautions discussed. Patient verbalizes her understanding and is in agreement with plan.  BP 129/85 mmHg  Pulse 97  Temp(Src) 97.8 F (36.6 C) (Oral)  Resp 18  Ht  5\' 2"  (1.575 m)  Wt 109.317 kg  BMI 44.07 kg/m2  SpO2 98%   Mady Gemma, PA-C 05/06/15 4098  Rolan Bucco, MD 05/07/15 1511

## 2015-05-05 NOTE — ED Notes (Signed)
Pt had a culposcopy on 1/3. States she had a little bleeding after the procedure, but then it stopped. States today she noticed a small amount of dark brown blood and some small blood clots. She also began to have some cramping in her L groin which also started today. Rates cramping as constant 5/10. Pt is on DepoProvera shot and hasn't had a normal period in about 8 yrs. Denies fevers, chills.

## 2015-05-06 NOTE — Discharge Instructions (Signed)
1. Medications: usual home medications 2. Treatment: rest, drink plenty of fluids 3. Follow Up: please followup with your gynecologist for discussion of your diagnoses and further evaluation after today's visit; if you do not have a primary care doctor use the resource guide provided to find one; please return to the ER for increased pain, persistent bleeding, new or worsening symptoms   Dysfunctional Uterine Bleeding Dysfunctional uterine bleeding is abnormal bleeding from the uterus. Dysfunctional uterine bleeding includes:  A period that comes earlier or later than usual.  A period that is lighter, heavier, or has blood clots.  Bleeding between periods.  Skipping one or more periods.  Bleeding after sexual intercourse.  Bleeding after menopause. HOME CARE INSTRUCTIONS  Pay attention to any changes in your symptoms. Follow these instructions to help with your condition: Eating  Eat well-balanced meals. Include foods that are high in iron, such as liver, meat, shellfish, green leafy vegetables, and eggs.  If you become constipated:  Drink plenty of water.  Eat fruits and vegetables that are high in water and fiber, such as spinach, carrots, raspberries, apples, and mango. Medicines  Take over-the-counter and prescription medicines only as told by your health care provider.  Do not change medicines without talking with your health care provider.  Aspirin or medicines that contain aspirin may make the bleeding worse. Do not take those medicines:  During the week before your period.  During your period.  If you were prescribed iron pills, take them as told by your health care provider. Iron pills help to replace iron that your body loses because of this condition. Activity  If you need to change your sanitary pad or tampon more than one time every 2 hours:  Lie in bed with your feet raised (elevated).  Place a cold pack on your lower abdomen.  Rest as much as possible  until the bleeding stops or slows down.  Do not try to lose weight until the bleeding has stopped and your blood iron level is back to normal. Other Instructions  For two months, write down:  When your period starts.  When your period ends.  When any abnormal bleeding occurs.  What problems you notice.  Keep all follow up visits as told by your health care provider. This is important. SEEK MEDICAL CARE IF:  You get light-headed or weak.  You have nausea and vomiting.  You cannot eat or drink without vomiting.  You feel dizzy or have diarrhea while you are taking medicines.  You are taking birth control pills or hormones, and you want to change them or stop taking them. SEEK IMMEDIATE MEDICAL CARE IF:  You develop a fever or chills.  You need to change your sanitary pad or tampon more than one time per hour.  Your bleeding becomes heavier, or your flow contains clots more often.  You develop pain in your abdomen.  You lose consciousness.  You develop a rash.   This information is not intended to replace advice given to you by your health care provider. Make sure you discuss any questions you have with your health care provider.   Document Released: 04/04/2000 Document Revised: 12/27/2014 Document Reviewed: 07/03/2014 Elsevier Interactive Patient Education 2016 [employer]   Emergency Department Resource Guide 1) Find a Doctor and Pay Out of Pocket Although you won't have to find out who is covered by your insurance plan, it is a good idea to ask around and get recommendations. You will then need to call  the office and see if the doctor you have chosen will accept you as a new patient and what types of options they offer for patients who are self-pay. Some doctors offer discounts or will set up payment plans for their patients who do not have insurance, but you will need to ask so you aren't surprised when you get to your appointment.  2) Contact Your Local Health  Department Not all health departments have doctors that can see patients for sick visits, but many do, so it is worth a call to see if yours does. If you don't know where your local health department is, you can check in your phone book. The CDC also has a tool to help you locate your state's health department, and many state websites also have listings of all of their local health departments.  3) Find a [facility] If your illness is not likely to be very severe or complicated, you may want to try a walk in clinic. These are popping up all over the country in pharmacies, drugstores, and shopping centers. They're usually staffed by nurse practitioners or physician assistants that have been trained to treat common illnesses and complaints. They're usually fairly quick and inexpensive. However, if you have serious medical issues or chronic medical problems, these are probably not your best option.  No Primary Care Doctor: - Call Health Connect at  [phone] - they can help you locate a primary care doctor that  accepts your insurance, provides certain services, etc. - Physician Referral Service- [phone]  Chronic Pain Problems: Organization         Address  Phone   Notes  [facility]  [phone] Patients need to be referred by their primary care doctor.   Medication Assistance: Organization         Address  Phone   Notes  [facility] Medication [facility] [address] [place], [place] [address] [phone] --Must be a resident of [facility] -- Must have NO insurance coverage whatsoever (no Medicaid/ Medicare, etc.) -- The pt. MUST have a primary care doctor that directs their care regularly and follows them in the community   Med[facility]ist  [phone]   [employer]  [phone]    Agencies that provide inexpensive medical care: Organization         Address  Phone   Notes  [place]  [phone]   [facility]    [phone]   [facility] [place], [place] [address] [phone]   [place] [address], [place] [phone]   Planned Parenthood    [phone]   [facility]    [phone]   [place] and [place] [address], [place] Phone:  [phone], Fax:  [phone] Hours of Operation:  9 am - 6 pm, M-F.  Also accepts Medicaid/Medicare and self-pay.  [facility] for [place] [address], [address], [place] Phone: [phone], Fax: [phone]. Hours of Operation:  8:30 am - 5:30 pm, M-F.  Also accepts Medicaid and self-pay.  [facility] [place] [address], [place] Phone: [phone]   [place], [place], [place] [phone], Ext. [phone] Mondays & Thursdays: 7-9 AM.  First 15 patients are seen on a first come, first serve basis.  [place] Providers:  Organization         Address  Phone   Notes  [facility] [address], [place] [phone] Also accepts self-pay patients.  [facility] [id number] [place], [place]  [phone]   [place], [address], [place] [phone]   [facility] [address] [place] [phone]   Lucianne Lei [address], [place]   [phone] Only accepts [place] Access [place] patients after they have their name applied to their card.   Self-Pay (no insurance) in [facility]:  Organization         Address  Phone   Notes  Sickle Cell Patients, [facility] Internal Medicine [place] [phone]   [facility] [place] [phone]   [facility] [place]  [place], [place],  [place] [phone]   [facility]/Dr. Osei-Bonsu  [address] [place] or [address], [place] [phone] Phone number for both [place] and [place] locations is the same.  Urgent Medical and [facility] [address] [place] [phone]   [facility] [address] [place] or [address] [phone]   [facility] [address] [place] [phone], phone; [phone], fax Sees patients 1st and 3rd Saturday of every month.  Must not qualify for public or private insurance (i.e. Medicaid, Medicare, [place] Health Choice, Veterans' Benefits)  Household income should be no more than 200% of the poverty level The clinic cannot treat you if you are pregnant or think you are pregnant  Sexually transmitted diseases are not treated at the clinic.    Dental Care: Organization         Address  Phone  Notes  [facility] of [facility] [place] [phone] Accepts children up to age 29 who are enrolled in [place] or [place]; pregnant women with a Medicaid card; and children who have applied for Medicaid or [place] Health Choice, but were declined, whose parents can pay a reduced fee at time of service.  [facility] Department of [facility]  [address], [place] [phone] Accepts children up to age 61 who are enrolled in [place] or [place]; pregnant women with a Medicaid card; and children who have applied for Medicaid or [place] Health Choice, but were declined, whose parents can pay a reduced fee at time of service.  [place] Adult Dental Access PROGRAM  [place] [phone] Patients are seen by appointment only. Walk-ins are not accepted. [place] will see patients 85 years of age and older. Monday - Tuesday (8am-5pm) Most Wednesdays  (8:30-5pm) $30 per visit, cash only  [facility] Adult Dental Access PROGRAM  [address], [facility] [phone] Patients are seen by appointment only. Walk-ins are not accepted. [place] will see patients 52 years of age and older. One Wednesday Evening (Monthly: Volunteer Based).  $30 per visit, cash only  [place]  [phone] for adults; Children under age 67, call Graduate Pediatric Dentistry at [phone]. Children aged 65-14, please call [phone] to request a  pediatric application.  Dental services are provided in all areas of dental care including fillings, crowns and bridges, complete and partial dentures, implants, gum treatment, root canals, and extractions. Preventive care is also provided. Treatment is provided to both adults and children. Patients are selected via a lottery and there is often a waiting list.   [facility] [address] [place]  [phone] [email or web address]   [facility] [address] [place], [place] [phone], Ext. [phone] Second and Fourth Thursday of each month, opens at 6:30 AM; Clinic ends at 9 AM.  Patients are seen on a first-come first-served basis, and a limited number are seen during each clinic.   [facility]  [address] Hillard Danker [place], [place] [phone]   Eligibility Requirements You must have lived in [place], [place], or [place] counties for at least the last three months.   You cannot be eligible for state or federal sponsored [employer], including [employer], [place], or [employer].   You generally cannot be eligible for healthcare insurance through your employer.    How to apply: Eligibility screenings are held every Tuesday and Wednesday afternoon from 1:00 pm until 4:00 pm. You do not need an appointment for the interview!  [facility] [address], [place], [place]   [place]  [place] Department  [place]  [phone]    Behavioral Health Resources in the Community: Intensive Outpatient Programs Organization         Address  Phone  Notes  [place]. [address], [place], [place] [phone]   [facility] Outpatient [address] [place], [place]   ADS: Alcohol & Drug Svcs [address] [place], [place]   [place] [address]  [place], [place] or [phone]   Substance Abuse Resources Organization         Address  Phone  Notes  Alcohol and Drug Services  [phone]   [place]  [phone]   [facility] [place]   Chinita Pester  [phone]   Residential & Outpatient Substance Abuse Program  [phone]   Psychological Services Organization         Address  Phone  Notes  [facility]  [place]  [phone]   [place] [address], [place] or [phone]    Mobile Crisis Teams Organization         Address  Phone  Notes  [facility], Mobile Crisis Care Unit  [phone]   [facility]  [address] [place], [place]   [place] Levels [address], [place] [phone]    Self-Help/Support Groups Organization         Address  Phone             Notes  [place]. of [place] - variety of support groups  [place] Call for more information  Narcotics Anonymous (NA), [facility] [address], [employer] [place]  2 meetings at this location   Special educational needs teacher         Address  Phone  Notes  [facility]idential Treatment [place],    [place]  [place]  [place], [place],  [place], [place]   [place], [place] [phone] Admissions: 8am-3pm M-F  Incentives Substance [place] [address],    [place], [place] [phone]   [facility] [address] [place], [place], [place]   [facility] [address]  [place], [place]   [facility]ight Programs - Intensive Outpatient [address], [place], [place]   [facility] [place]  [place], [place] [phone] or [phone]   [facility]idential Treatment Services (RTS) [address], [place], [place] Accepts Medicaid  [facility] [place] [address],  [place] [phone] Substance Abuse/Addiction Treatment   [facility] Organization         Address  Phone  Notes  [facility]  [phone]   Domenic Schwab, PhD [address] Arlis Porta [place], [place]   [phone] or [phone]   Gandy Rotonda [place], [place] [phone]   [facility] [address] [place], [place] [phone] Insurance/Medicaid/sponsorship through [facility] and Families [address], [address] [place]                                    [place], [place] [phone] [place] [address], [place] [phone]    Dr. Adele Schilder  [phone]   [facility] Dept. 1) [address] [place] [address] [place] 2) [place] 3)  [place] [address], [place] [phone]  [phone]   [place] [phone] or [phone] (After Hours)

## 2015-05-07 LAB — GC/CHLAMYDIA PROBE AMP (~~LOC~~) NOT AT ARMC
Chlamydia: POSITIVE — AB
Neisseria Gonorrhea: NEGATIVE

## 2015-05-08 ENCOUNTER — Telehealth (HOSPITAL_BASED_OUTPATIENT_CLINIC_OR_DEPARTMENT_OTHER): Payer: Self-pay | Admitting: Emergency Medicine

## 2015-05-08 NOTE — Telephone Encounter (Signed)
Chart handoff to EDP for treatment plan for + chlamydia 

## 2015-05-24 ENCOUNTER — Ambulatory Visit: Payer: BLUE CROSS/BLUE SHIELD

## 2015-05-30 ENCOUNTER — Ambulatory Visit (INDEPENDENT_AMBULATORY_CARE_PROVIDER_SITE_OTHER): Payer: BLUE CROSS/BLUE SHIELD | Admitting: *Deleted

## 2015-05-30 DIAGNOSIS — Z3042 Encounter for surveillance of injectable contraceptive: Secondary | ICD-10-CM

## 2015-05-30 NOTE — Progress Notes (Signed)
Pt here today for Depo Provera .

## 2015-06-29 ENCOUNTER — Ambulatory Visit: Payer: BLUE CROSS/BLUE SHIELD | Admitting: Obstetrics & Gynecology

## 2015-06-29 DIAGNOSIS — Z01419 Encounter for gynecological examination (general) (routine) without abnormal findings: Secondary | ICD-10-CM

## 2015-07-04 ENCOUNTER — Emergency Department (HOSPITAL_COMMUNITY)
Admission: EM | Admit: 2015-07-04 | Discharge: 2015-07-04 | Disposition: A | Discharge status: Home / Self Care | Payer: BLUE CROSS/BLUE SHIELD | Attending: Emergency Medicine | Admitting: Emergency Medicine

## 2015-07-04 DIAGNOSIS — Y998 Other external cause status: Secondary | ICD-10-CM | POA: Diagnosis not present

## 2015-07-04 DIAGNOSIS — Z8619 Personal history of other infectious and parasitic diseases: Secondary | ICD-10-CM | POA: Diagnosis not present

## 2015-07-04 DIAGNOSIS — N644 Mastodynia: Secondary | ICD-10-CM | POA: Diagnosis present

## 2015-07-04 DIAGNOSIS — Y9289 Other specified places as the place of occurrence of the external cause: Secondary | ICD-10-CM | POA: Diagnosis not present

## 2015-07-04 DIAGNOSIS — Y9389 Activity, other specified: Secondary | ICD-10-CM | POA: Insufficient documentation

## 2015-07-04 DIAGNOSIS — T148 Other injury of unspecified body region: Secondary | ICD-10-CM | POA: Diagnosis not present

## 2015-07-04 DIAGNOSIS — M79622 Pain in left upper arm: Secondary | ICD-10-CM | POA: Insufficient documentation

## 2015-07-04 DIAGNOSIS — R0781 Pleurodynia: Secondary | ICD-10-CM | POA: Insufficient documentation

## 2015-07-04 DIAGNOSIS — R0789 Other chest pain: Secondary | ICD-10-CM | POA: Insufficient documentation

## 2015-07-04 DIAGNOSIS — Z7951 Long term (current) use of inhaled steroids: Secondary | ICD-10-CM | POA: Insufficient documentation

## 2015-07-04 DIAGNOSIS — X58XXXA Exposure to other specified factors, initial encounter: Secondary | ICD-10-CM | POA: Insufficient documentation

## 2015-07-04 DIAGNOSIS — Z79899 Other long term (current) drug therapy: Secondary | ICD-10-CM | POA: Insufficient documentation

## 2015-07-04 DIAGNOSIS — T148XXA Other injury of unspecified body region, initial encounter: Secondary | ICD-10-CM

## 2015-07-04 MED ORDER — CYCLOBENZAPRINE HCL 10 MG PO TABS: 10.0000 mg | ORAL_TABLET | Freq: Two times a day (BID) | ORAL | Status: DC | PRN

## 2015-07-04 MED ORDER — NAPROXEN 500 MG PO TABS: 500.0000 mg | ORAL_TABLET | Freq: Two times a day (BID) | ORAL | Status: DC

## 2015-07-04 NOTE — ED Notes (Signed)
Saturday Pt began to have pain in the lateral portion of left breast that has continued to increase.  No injury reported.

## 2015-07-04 NOTE — Discharge Instructions (Signed)
Take naprosyn as prescribed for pain and inflammation. Take flexeril for muscle spasms. No heavy lifting for at least a week with left arm. Follow up as needed. Return if develop fever, cough, shortness of breath, rash or redness to the area.     Chest Wall Pain Chest wall pain is pain in or around the bones and muscles of your chest. Sometimes, an injury causes this pain. Sometimes, the cause may not be known. This pain may take several weeks or longer to get better. HOME CARE INSTRUCTIONS  Pay attention to any changes in your symptoms. Take these actions to help with your pain:   Rest as told by your health care provider.   Avoid activities that cause pain. These include any activities that use your chest muscles or your abdominal and side muscles to lift heavy items.   If directed, apply ice to the painful area:  Put ice in a plastic bag.  Place a towel between your skin and the bag.  Leave the ice on for 20 minutes, 2-3 times per day.  Take over-the-counter and prescription medicines only as told by your health care provider.  Do not use tobacco products, including cigarettes, chewing tobacco, and e-cigarettes. If you need help quitting, ask your health care provider.  Keep all follow-up visits as told by your health care provider. This is important. SEEK MEDICAL CARE IF:  You have a fever.  Your chest pain becomes worse.  You have new symptoms. SEEK IMMEDIATE MEDICAL CARE IF:  You have nausea or vomiting.  You feel sweaty or light-headed.  You have a cough with phlegm (sputum) or you cough up blood.  You develop shortness of breath.   This information is not intended to replace advice given to you by your health care provider. Make sure you discuss any questions you have with your health care provider.   Document Released: 04/07/2005 Document Revised: 12/27/2014 Document Reviewed: 07/03/2014 Elsevier Interactive Patient Education 2016 Elsevier Inc. Muscle  Strain A muscle strain is an injury that occurs when a muscle is stretched beyond its normal length. Usually a small number of muscle fibers are torn when this happens. Muscle strain is rated in degrees. First-degree strains have the least amount of muscle fiber tearing and pain. Second-degree and third-degree strains have increasingly more tearing and pain.  Usually, recovery from muscle strain takes 1-2 weeks. Complete healing takes 5-6 weeks.  CAUSES  Muscle strain happens when a sudden, violent force placed on a muscle stretches it too far. This may occur with lifting, sports, or a fall.  RISK FACTORS Muscle strain is especially common in athletes.  SIGNS AND SYMPTOMS At the site of the muscle strain, there may be:  Pain.  Bruising.  Swelling.  Difficulty using the muscle due to pain or lack of normal function. DIAGNOSIS  Your health care provider will perform a physical exam and ask about your medical history. TREATMENT  Often, the best treatment for a muscle strain is resting, icing, and applying cold compresses to the injured area.  HOME CARE INSTRUCTIONS   Use the PRICE method of treatment to promote muscle healing during the first 2-3 days after your injury. The PRICE method involves:  Protecting the muscle from being injured again.  Restricting your activity and resting the injured body part.  Icing your injury. To do this, put ice in a plastic bag. Place a towel between your skin and the bag. Then, apply the ice and leave it on from 15-20  minutes each hour. After the third day, switch to moist heat packs.  Apply compression to the injured area with a splint or elastic bandage. Be careful not to wrap it too tightly. This may interfere with blood circulation or increase swelling.  Elevate the injured body part above the level of your heart as often as you can.  Only take over-the-counter or prescription medicines for pain, discomfort, or fever as directed by your health  care provider.  Warming up prior to exercise helps to prevent future muscle strains. SEEK MEDICAL CARE IF:   You have increasing pain or swelling in the injured area.  You have numbness, tingling, or a significant loss of strength in the injured area. MAKE SURE YOU:   Understand these instructions.  Will watch your condition.  Will get help right away if you are not doing well or get worse.   This information is not intended to replace advice given to you by your health care provider. Make sure you discuss any questions you have with your health care provider.   Document Released: 04/07/2005 Document Revised: 01/26/2013 Document Reviewed: 11/04/2012 Elsevier Interactive Patient Education [employer]2016 Elsevier Inc.

## 2015-07-04 NOTE — ED Provider Notes (Signed)
CSN: [id number]     Arrival date & time 07/04/15  2043 History  By signing my name below, I, Diane Stokes, attest that this documentation has been prepared under the direction and in the presence of [employer], PA-C. Electronically Signed: Budd Stokes, ED Scribe. 07/04/2015. 9:36 PM.    Chief Complaint  Patient presents with  . Breast Pain   The history is provided by the patient. No language interpreter was used.   HPI Comments: Diane Stokes is a 25 y.o. female former smoker (quit 1 week ago) who presents to the Emergency Department complaining of constant, worsening pain in the lateral portion of the left breast onset 4 days ago. She reports associated tenderness to palpation and mild welling compared to the other breast. She reports exacerbation of the pain with sitting and movement. She notes she does heavy lifting on a daily basis, but denies any recent injury. She also reports a recent exposure to meningitis. She also denies using any new bras, as well as the possibility of pregnancy. Pt denies rash to the area, SOB, cough, and drainage from the nipple.   Past Medical History  Diagnosis Date  . Seasonal allergies   . HPV (human papilloma virus) infection    No past surgical history on file. Family History  Problem Relation Age of Onset  . Diabetes Maternal Grandfather   . Hypertension Maternal Grandfather   . Hyperlipidemia Mother   . Hypertension Mother    Social History  Substance Use Topics  . Smoking status: Never Smoker   . Smokeless tobacco: Never Used  . Alcohol Use: 0.6 oz/week    1 Cans of beer per week     Comment: occasional   OB History    Gravida Para Term Preterm AB TAB SAB Ectopic Multiple Living   0              Review of Systems  Respiratory: Negative for cough and shortness of breath.   Musculoskeletal: Positive for myalgias (Left Breast).  Skin: Negative for rash.    Allergies  Minocycline and Diflucan  Home Medications   Prior  to Admission medications   Medication Sig Start Date End Date Taking? Authorizing Provider  cyclobenzaprine (FLEXERIL) 10 MG tablet Take 1 tablet (10 mg total) by mouth 2 (two) times daily as needed for muscle spasms. 07/04/15   Angenette Daily, PA-C  fluticasone (FLONASE) 50 MCG/ACT nasal spray Place 2 sprays into both nostrils at bedtime. 11/27/14   Ozella Rocks, MD  ibuprofen (ADVIL,MOTRIN) 800 MG tablet Take 1 tablet (800 mg total) by mouth every 8 (eight) hours as needed. 03/21/15   Lori A Clemmons, CNM  ipratropium (ATROVENT) 0.06 % nasal spray Place 2 sprays into both nostrils 4 (four) times daily. Patient not taking: Reported on 05/06/2015 11/27/14   Ozella Rocks, MD  medroxyPROGESTERone (DEPO-PROVERA) 150 MG/ML injection Inject 1 mL (150 mg total) into the muscle every 3 (three) months. 09/15/14   Allie Bossier, MD  Multiple Vitamin (MULTIVITAMIN) tablet Take 1 tablet by mouth every morning.     Historical Provider, MD  naproxen (NAPROSYN) 500 MG tablet Take 1 tablet (500 mg total) by mouth 2 (two) times daily. 07/04/15   Taeya Theall, PA-C   BP 126/90 mmHg  Pulse 94  Temp(Src) 98 F (36.7 C) (Oral)  Resp 16  SpO2 100% Physical Exam  Constitutional: She is oriented to person, place, and time. She appears well-developed and well-nourished.  HENT:  Head: Normocephalic  and atraumatic.  Eyes: Conjunctivae are normal. Right eye exhibits no discharge. Left eye exhibits no discharge.  Neck: Neck supple.  Cardiovascular: Normal rate, regular rhythm and normal heart sounds.   Pulmonary/Chest: Effort normal and breath sounds normal. No respiratory distress. She has no wheezes. She has no rales. She exhibits tenderness.  Tender to palpation over the left pectoralis muscle and left lateral ribs. No rashes, erythema, swelling noted. Breast tissue is normal with no swelling, redness, masses palpated.  Musculoskeletal:  Pain with movement of the left arm at the shoulder joint. Pain is in  the chest and ribs. Specifically pain with flexion and abduction of the arm and adduction against resistance.  Neurological: She is alert and oriented to person, place, and time. Coordination normal.  Skin: Skin is warm and dry. No rash noted. She is not diaphoretic. No erythema.  Psychiatric: She has a normal mood and affect.  Nursing note and vitals reviewed.   ED Course  Procedures  DIAGNOSTIC STUDIES: Oxygen Saturation is 100% on RA, normal by my interpretation.    COORDINATION OF CARE: 9:35 PM - Discussed probable muscle strain as well as plans to order anti-inflammatories and a muscle relaxer. Will write a note for work for tomorrow. Recommended pt return if she develops a rash or SOB. Pt advised of plan for treatment and pt agrees.  Labs Review Labs Reviewed - No data to display  Imaging Review No results found. I have personally reviewed and evaluated these images and lab results as part of my medical decision-making.   EKG Interpretation None      MDM   Final diagnoses:  Chest wall pain  Muscle strain    patient with left rib and chest wall pain. It is tender to palpation and pain is her producible on exam with palpation of the pectoralis muscle and lateral ribs. Pain is also reproducible with movement of the left arm. Breast tissue is normal with no masses, swelling, tenderness. There is no rashes to the area, there is no erythema. She denies any lung issues including no cough or pain with deep breathing. Her vital signs are normal. Most likely muscular strain, patient states she works where she has to lift heavy objects daily. Will discharge home with naproxen, Flexeril, no heavy lifting for a week. Follow-up as needed. Return precautions discussed.  Filed Vitals:   07/04/15 2049  BP: 126/90  Pulse: 94  Temp: 98 F (36.7 C)  TempSrc: Oral  Resp: 16  SpO2: 100%    I personally performed the services described in this documentation, which was scribed in my  presence. The recorded information has been reviewed and is accurate.   Jaynie Crumbleatyana Shan Valdes, PA-C 07/04/15 2140  Mancel BaleElliott Wentz, MD 07/06/15 [phone]

## 2015-08-01 ENCOUNTER — Encounter (HOSPITAL_COMMUNITY): Payer: Self-pay | Admitting: Emergency Medicine

## 2015-08-01 ENCOUNTER — Emergency Department (HOSPITAL_COMMUNITY)
Admission: EM | Admit: 2015-08-01 | Discharge: 2015-08-01 | Disposition: A | Discharge status: Home / Self Care | Payer: BLUE CROSS/BLUE SHIELD | Attending: Emergency Medicine | Admitting: Emergency Medicine

## 2015-08-01 DIAGNOSIS — T23232A Burn of second degree of multiple left fingers (nail), not including thumb, initial encounter: Secondary | ICD-10-CM | POA: Diagnosis not present

## 2015-08-01 DIAGNOSIS — T23222A Burn of second degree of single left finger (nail) except thumb, initial encounter: Secondary | ICD-10-CM

## 2015-08-01 DIAGNOSIS — Z79899 Other long term (current) drug therapy: Secondary | ICD-10-CM | POA: Diagnosis not present

## 2015-08-01 DIAGNOSIS — X12XXXA Contact with other hot fluids, initial encounter: Secondary | ICD-10-CM | POA: Insufficient documentation

## 2015-08-01 DIAGNOSIS — Z8619 Personal history of other infectious and parasitic diseases: Secondary | ICD-10-CM | POA: Insufficient documentation

## 2015-08-01 DIAGNOSIS — Y990 Civilian activity done for income or pay: Secondary | ICD-10-CM | POA: Insufficient documentation

## 2015-08-01 DIAGNOSIS — Y9389 Activity, other specified: Secondary | ICD-10-CM | POA: Diagnosis not present

## 2015-08-01 DIAGNOSIS — T23122A Burn of first degree of single left finger (nail) except thumb, initial encounter: Secondary | ICD-10-CM

## 2015-08-01 DIAGNOSIS — Y92000 Kitchen of unspecified non-institutional (private) residence as  the place of occurrence of the external cause: Secondary | ICD-10-CM | POA: Insufficient documentation

## 2015-08-01 DIAGNOSIS — T23032A Burn of unspecified degree of multiple left fingers (nail), not including thumb, initial encounter: Secondary | ICD-10-CM | POA: Diagnosis present

## 2015-08-01 DIAGNOSIS — Z7952 Long term (current) use of systemic steroids: Secondary | ICD-10-CM | POA: Diagnosis not present

## 2015-08-01 DIAGNOSIS — Z791 Long term (current) use of non-steroidal anti-inflammatories (NSAID): Secondary | ICD-10-CM | POA: Insufficient documentation

## 2015-08-01 MED ORDER — TETANUS-DIPHTH-ACELL PERTUSSIS 5-2.5-18.5 LF-MCG/0.5 IM SUSP
0.5000 mL | Freq: Once | INTRAMUSCULAR | Status: AC
  Administered 2015-08-01: 0.5 mL via INTRAMUSCULAR
  Filled 2015-08-01: qty 0.5

## 2015-08-01 MED ORDER — SILVER SULFADIAZINE 1 % EX CREA
TOPICAL_CREAM | Freq: Once | CUTANEOUS | Status: AC
  Administered 2015-08-01: 1 via TOPICAL
  Filled 2015-08-01: qty 50
  Filled 2015-08-01: qty 85
  Filled 2015-08-01: qty 50

## 2015-08-01 NOTE — Discharge Instructions (Signed)
Continue using the silvadene cream at home and changing dressing. Try to keep clean and dry as much as possible. Monitor for redness, drainage, swelling, high fever, etc. That would indicate infection. Return to the ED for new or worsening symptoms.  Burn Care Your skin is a natural barrier to infection. It is the largest organ of your body. Burns damage this natural protection. To help prevent infection, it is very important to follow your caregiver's instructions in the care of your burn. Burns are classified as:  First degree. There is only redness of the skin (erythema). No scarring is expected.  Second degree. There is blistering of the skin. Scarring may occur with deeper burns.  Third degree. All layers of the skin are injured, and scarring is expected. HOME CARE INSTRUCTIONS   Wash your hands well before changing your bandage.  Change your bandage as often as directed by your caregiver.  Remove the old bandage. If the bandage sticks, you may soak it off with cool, clean water.  Cleanse the burn thoroughly but gently with mild soap and water.  Pat the area dry with a clean, dry cloth.  Apply a thin layer of antibacterial cream to the burn.  Apply a clean bandage as instructed by your caregiver.  Keep the bandage as clean and dry as possible.  Elevate the affected area for the first 24 hours, then as instructed by your caregiver.  Only take over-the-counter or prescription medicines for pain, discomfort, or fever as directed by your caregiver. SEEK IMMEDIATE MEDICAL CARE IF:   You develop excessive pain.  You develop redness, tenderness, swelling, or red streaks near the burn.  The burned area develops yellowish-white fluid (pus) or a bad smell.  You have a fever. MAKE SURE YOU:   Understand these instructions.  Will watch your condition.  Will get help right away if you are not doing well or get worse.   This information is not intended to replace advice  given to you by your health care provider. Make sure you discuss any questions you have with your health care provider.   Document Released: 04/07/2005 Document Revised: 06/30/2011 Document Reviewed: 08/28/2010 Elsevier Interactive Patient Education [employer]2016 Elsevier Inc.

## 2015-08-01 NOTE — ED Notes (Signed)
Tried to pull a hot pan out of the oven at work, hot water splashed on right hand. Blisters to left hand

## 2015-08-01 NOTE — ED Provider Notes (Signed)
CSN: [id number]     Arrival date & time 08/01/15  1810 History  By signing my name below, I, Ronney LionSuzanne Le, attest that this documentation has been prepared under the direction and in the presence of Sharilyn SitesLisa Eloni Darius, PA-C. Electronically Signed: Ronney LionSuzanne Le, ED Scribe. 08/01/2015. 6:42 PM.    Chief Complaint  Patient presents with  . Burn   The history is provided by the patient. No language interpreter was used.    HPI Comments: Diane Stokes is a 25 y.o. female with a history of seasonal allergies, who presents to the Emergency Department complaining of a sudden-onset, constant, blistering burn to the fingers of her left hand, particularly to her first and second fingers, that onset PTA while at work. Patient states she was moving a steaming appliance in the kitchen at work and was not wearing oven mitts when hot water from the steamer splashed onto her left hand. No treatments or modifying factors were noted. She denies a history of DM. She states she cannot remember the date of her last tetanus vaccination.    Past Medical History  Diagnosis Date  . Seasonal allergies   . HPV (human papilloma virus) infection    History reviewed. No pertinent past surgical history. Family History  Problem Relation Age of Onset  . Diabetes Maternal Grandfather   . Hypertension Maternal Grandfather   . Hyperlipidemia Mother   . Hypertension Mother    Social History  Substance Use Topics  . Smoking status: Never Smoker   . Smokeless tobacco: Never Used  . Alcohol Use: 0.6 oz/week    1 Cans of beer per week     Comment: occasional   OB History    Gravida Para Term Preterm AB TAB SAB Ectopic Multiple Living   0              Review of Systems  Musculoskeletal: Positive for myalgias (left hand pain).  Skin: Positive for color change (redness to left hand).  All other systems reviewed and are negative.  Allergies  Minocycline and Diflucan  Home Medications   Prior to Admission medications    Medication Sig Start Date End Date Taking? Authorizing Provider  cyclobenzaprine (FLEXERIL) 10 MG tablet Take 1 tablet (10 mg total) by mouth 2 (two) times daily as needed for muscle spasms. 07/04/15   Tatyana Kirichenko, PA-C  fluticasone (FLONASE) 50 MCG/ACT nasal spray Place 2 sprays into both nostrils at bedtime. 11/27/14   Ozella Rocksavid J Merrell, MD  ibuprofen (ADVIL,MOTRIN) 800 MG tablet Take 1 tablet (800 mg total) by mouth every 8 (eight) hours as needed. 03/21/15   Lori A Clemmons, CNM  ipratropium (ATROVENT) 0.06 % nasal spray Place 2 sprays into both nostrils 4 (four) times daily. Patient not taking: Reported on 05/06/2015 11/27/14   Ozella Rocksavid J Merrell, MD  medroxyPROGESTERone (DEPO-PROVERA) 150 MG/ML injection Inject 1 mL (150 mg total) into the muscle every 3 (three) months. 09/15/14   Allie BossierMyra C Dove, MD  Multiple Vitamin (MULTIVITAMIN) tablet Take 1 tablet by mouth every morning.     Historical Provider, MD  naproxen (NAPROSYN) 500 MG tablet Take 1 tablet (500 mg total) by mouth 2 (two) times daily. 07/04/15   Tatyana Kirichenko, PA-C   BP 131/89 mmHg  Pulse 89  Temp(Src) 98 F (36.7 C) (Oral)  Resp 19  SpO2 100%   Physical Exam  Constitutional: She is oriented to person, place, and time. She appears well-developed and well-nourished.  HENT:  Head: Normocephalic and atraumatic.  Mouth/Throat: Oropharynx is clear and moist.  Eyes: Conjunctivae and EOM are normal. Pupils are equal, round, and reactive to light.  Neck: Normal range of motion.  Cardiovascular: Normal rate, regular rhythm and normal heart sounds.   Pulmonary/Chest: Effort normal and breath sounds normal.  Abdominal: Soft. Bowel sounds are normal.  Musculoskeletal: Normal range of motion.  Mixed first and second degree burns of left index and middle fingers; blisters intact; burns are not circumferential or in a glove pattern; no signs of superimposed infection; full ROM of fingers maintained; hand is warm, well perfused;  compartments soft  Neurological: She is alert and oriented to person, place, and time.  Skin: Skin is warm and dry.  Psychiatric: She has a normal mood and affect.  Nursing note and vitals reviewed.          ED Course  Procedures (including critical care time)  DIAGNOSTIC STUDIES: Oxygen Saturation is 100% on RA, normal by my interpretation.    COORDINATION OF CARE: 6:37 PM - Discussed treatment plan with pt at bedside which includes Rx symptomatic ointment. Pt verbalized understanding and agreed to plan.   MDM   Final diagnoses:  Burn of finger, left, first degree, initial encounter  Burn of finger, left, second degree, initial encounter   24 rolled female here with burn to left index and middle finger from a steamer pain. She reports she is not wearing the proper oven mits and the hot water splashed onto her fingers will try to take a pan out of the steamer. She has mild mixed first and second-degree burns of her left index and middle fingers. These burns are not circumferential or in a glove pattern. There are no signs of superimposed infection. Her hand is neurovascularly intact. Total body surface area affected is < 1%.  Tetanus will be updated. Patient was given Silvadene and instructed on home wound care with dressing changes.  Discussed plan with patient, he/she acknowledged understanding and agreed with plan of care.  Return precautions given for new or worsening symptoms.  I personally performed the services described in this documentation, which was scribed in my presence. The recorded information has been reviewed and is accurate.  Garlon Hatchet, PA-C 08/01/15 1926  Arby Barrette, MD 08/02/15 0010

## 2015-08-16 ENCOUNTER — Ambulatory Visit (INDEPENDENT_AMBULATORY_CARE_PROVIDER_SITE_OTHER): Payer: BLUE CROSS/BLUE SHIELD | Admitting: *Deleted

## 2015-08-16 DIAGNOSIS — Z3042 Encounter for surveillance of injectable contraceptive: Secondary | ICD-10-CM | POA: Diagnosis not present

## 2015-08-16 MED ORDER — MEDROXYPROGESTERONE ACETATE 150 MG/ML IM SUSP: 150.0000 mg | INTRAMUSCULAR | Status: DC

## 2015-08-16 NOTE — Progress Notes (Signed)
Patient ID: Diane Stokes, female   DOB: 03/26/1991, [age] y.o.   MRN: [id number] Pt here for routine Depo-Provera injection. Pt rcvd injection from stock supply but requests refills be sent to pharmacy for her to pick up the next time she has an injection due. Sent Rx to pharmacy per pt request.

## 2015-08-28 ENCOUNTER — Encounter: Payer: Self-pay | Admitting: Family Medicine

## 2015-08-28 ENCOUNTER — Ambulatory Visit (INDEPENDENT_AMBULATORY_CARE_PROVIDER_SITE_OTHER): Payer: BLUE CROSS/BLUE SHIELD | Admitting: Family Medicine

## 2015-08-28 VITALS — BP 139/86 | HR 108 | Resp 18 | Ht 62.0 in | Wt 243.0 lb

## 2015-08-28 DIAGNOSIS — B354 Tinea corporis: Secondary | ICD-10-CM | POA: Diagnosis not present

## 2015-08-28 DIAGNOSIS — Z113 Encounter for screening for infections with a predominantly sexual mode of transmission: Secondary | ICD-10-CM

## 2015-08-28 DIAGNOSIS — N898 Other specified noninflammatory disorders of vagina: Secondary | ICD-10-CM | POA: Diagnosis not present

## 2015-08-28 MED ORDER — CLOTRIMAZOLE-BETAMETHASONE 1-0.05 % EX CREA: 1.0000 "application " | TOPICAL_CREAM | Freq: Two times a day (BID) | CUTANEOUS | Status: DC

## 2015-08-28 NOTE — Patient Instructions (Signed)

## 2015-08-28 NOTE — Progress Notes (Signed)
Pt c/o vaginal discharge with irritation and odor, was treated in 04/2015 for chlamydia.  Pt used OTC Monistat with little relief.

## 2015-08-28 NOTE — Progress Notes (Signed)
    Subjective:    Patient ID: Diane Stokes is a 25 y.o. female presenting with Vaginal Discharge  on 08/28/2015  HPI: 1 month h/o vaginal itching, yellow discharge. Took Monistat and it got some better, reflared in 2 wks. Repeated Monistat and found it helped some but did not fix issue completely. Has itching, discharge and abdominal pain. Denies fever/chills/Nausea or vomiting. H/o of positive chlamydia in 1/17. Same partner x 3 months.  Review of Systems  Constitutional: Negative for fever and chills.  Respiratory: Negative for shortness of breath.   Cardiovascular: Negative for chest pain.  Gastrointestinal: Negative for nausea, vomiting and abdominal pain.  Genitourinary: Negative for dysuria.  Skin: Negative for rash.      Objective:    BP 139/86 mmHg  Pulse 108  Resp 18  Ht 5\' 2"  (1.575 m)  Wt 243 lb (110.224 kg)  BMI 44.43 kg/m2 Physical Exam  Constitutional: She is oriented to person, place, and time. She appears well-developed and well-nourished. No distress.  HENT:  Head: Normocephalic and atraumatic.  Eyes: No scleral icterus.  Neck: Neck supple.  Cardiovascular: Normal rate.   Pulmonary/Chest: Effort normal.  Abdominal: Soft.  Genitourinary:  Vulva has erythematous plaque in intertrigional region,BUS normal, vagina is pink and rugated, cervix is nulliparous without lesion, it is red, no CMT, uterus is small and anteverted, no adnexal mass or tenderness.   Neurological: She is alert and oriented to person, place, and time.  Skin: Skin is warm and dry.  Psychiatric: She has a normal mood and affect.        Assessment & Plan:   Problem List Items Addressed This Visit    None    Visit Diagnoses    Vaginal discharge    -  Primary    check BD Affirm, GC/Chlam and treat accordingly    Relevant Orders    WET PREP BY MOLECULAR PROBE    GC/Chlamydia probe amp ([place])not at [facility]    Tinea corporis        Relevant Medications    clotrimazole-betamethasone (LOTRISONE) cream        Return if symptoms worsen or fail to improve.  Renaldo Gornick S 08/28/2015 10:04 AM

## 2015-08-29 ENCOUNTER — Telehealth: Payer: Self-pay | Admitting: *Deleted

## 2015-08-29 DIAGNOSIS — A5901 Trichomonal vulvovaginitis: Secondary | ICD-10-CM

## 2015-08-29 LAB — GC/CHLAMYDIA PROBE AMP (~~LOC~~) NOT AT ARMC
Chlamydia: NEGATIVE
NEISSERIA GONORRHEA: NEGATIVE

## 2015-08-29 LAB — WET PREP BY MOLECULAR PROBE
Candida species: NEGATIVE
Gardnerella vaginalis: NEGATIVE
Trichomonas vaginosis: POSITIVE — AB

## 2015-08-29 MED ORDER — METRONIDAZOLE 500 MG PO TABS: ORAL_TABLET | ORAL | Status: DC

## 2015-08-29 NOTE — Telephone Encounter (Signed)
-----   Message from Reva Boresanya S Pratt, MD sent at 08/29/2015  3:33 PM EDT ----- Her trich is positive--flagyl 2 gm po x 1--treat partner

## 2015-08-29 NOTE — Telephone Encounter (Signed)
Informed pt of + trich result on wet prep, instructed pt on medication use and sent rx to pharmacy. Also informed pt to have partner treated and refrain sexual intercourse until both have been treated.

## 2015-10-09 ENCOUNTER — Telehealth: Payer: Self-pay | Admitting: *Deleted

## 2015-10-09 DIAGNOSIS — R109 Unspecified abdominal pain: Secondary | ICD-10-CM

## 2015-10-09 MED ORDER — IBUPROFEN 800 MG PO TABS: 800.0000 mg | ORAL_TABLET | Freq: Three times a day (TID) | ORAL | Status: DC | PRN

## 2015-10-09 NOTE — Telephone Encounter (Signed)
Refilled Ibuprofen after verbal authorization from Dr Jolayne Pantheronstant.

## 2015-10-09 NOTE — Telephone Encounter (Signed)
-----   Message from Olevia BowensJacinda S Battle sent at 10/09/2015  8:47 AM EDT ----- Regarding: Refill Request Contact: [phone] Wants a refill on 800 mg ibuprofen  Uses [facility] on [address]  Explained to pt refill is highly unlikey, provider who wrote for it is no longer with us.

## 2015-11-01 ENCOUNTER — Ambulatory Visit (INDEPENDENT_AMBULATORY_CARE_PROVIDER_SITE_OTHER): Payer: BLUE CROSS/BLUE SHIELD | Admitting: *Deleted

## 2015-11-01 DIAGNOSIS — Z3042 Encounter for surveillance of injectable contraceptive: Secondary | ICD-10-CM

## 2015-11-01 MED ORDER — MEDROXYPROGESTERONE ACETATE 150 MG/ML IM SUSP
150.0000 mg | INTRAMUSCULAR | Status: DC
  Administered 2015-11-01: 150 mg via INTRAMUSCULAR

## 2015-11-01 NOTE — Progress Notes (Signed)
Pt here for Depo Provera 150mg  injection, provided medication from pharmacy.  Last injection given 08/16/2015.

## 2015-12-23 ENCOUNTER — Emergency Department (HOSPITAL_COMMUNITY)
Admission: EM | Admit: 2015-12-23 | Discharge: 2015-12-23 | Disposition: A | Discharge status: Home / Self Care | Payer: BLUE CROSS/BLUE SHIELD | Attending: Emergency Medicine | Admitting: Emergency Medicine

## 2015-12-23 DIAGNOSIS — H01006 Unspecified blepharitis left eye, unspecified eyelid: Secondary | ICD-10-CM

## 2015-12-23 DIAGNOSIS — H5712 Ocular pain, left eye: Secondary | ICD-10-CM | POA: Diagnosis present

## 2015-12-23 DIAGNOSIS — Z79899 Other long term (current) drug therapy: Secondary | ICD-10-CM | POA: Insufficient documentation

## 2015-12-23 DIAGNOSIS — H01005 Unspecified blepharitis left lower eyelid: Secondary | ICD-10-CM | POA: Insufficient documentation

## 2015-12-23 DIAGNOSIS — Z7951 Long term (current) use of inhaled steroids: Secondary | ICD-10-CM | POA: Insufficient documentation

## 2015-12-23 DIAGNOSIS — F17200 Nicotine dependence, unspecified, uncomplicated: Secondary | ICD-10-CM | POA: Insufficient documentation

## 2015-12-23 MED ORDER — ERYTHROMYCIN 5 MG/GM OP OINT: TOPICAL_OINTMENT | OPHTHALMIC | 0 refills | Status: DC

## 2015-12-23 NOTE — Discharge Instructions (Signed)
Apply warm compresses to eye 2-4 time per day. Your antibiotic ointment for times a day for 7-10 days. If symptom fail to improve.

## 2015-12-23 NOTE — ED Triage Notes (Signed)
Pt states that she has had swelling, itching, drainage and pain on her L eyelid since Friday. Vision intact. Alert and oriented.

## 2015-12-23 NOTE — ED Notes (Signed)
Pt stated "I've had a sty before so I've been putting warm, moist compresses on it."  Pt also requesting a note for today.

## 2015-12-23 NOTE — ED Provider Notes (Signed)
[facility]-EMERGENCY DEPT Provider Note   CSN: [id number] Arrival date & time: 12/23/15  1947     History   Chief Complaint Chief Complaint  Patient presents with  . Eye Problem    HPI Diane Stokes is a 25 y.o. female.  25 year old Caucasian female no past medical history presents this evening with left eyelid pain that started approximately 3 days ago.. She endorses swelling, erythema, pruritus and drainage of her left eyelid. Denies any vision changes. Patient states she has a history of styes in the past. She has been putting warm compresses on it with little relief. Minimal pain. Has been taking ibuprofen helped the swelling. Denies any fever, chills, allergic symptoms, vision changes.       Past Medical History:  Diagnosis Date  . HPV (human papilloma virus) infection   . Seasonal allergies     Patient Active Problem List   Diagnosis Date Noted  . Severe obesity (BMI >= 40) (HCC) 12/27/2013  . Morbid obesity (HCC) 05/19/2013  . Abnormal Pap smear of cervix 05/19/2013    No past surgical history on file.  OB History    Gravida Para Term Preterm AB Living   0             SAB TAB Ectopic Multiple Live Births                   Home Medications    Prior to Admission medications   Medication Sig Start Date End Date Taking? Authorizing Provider  clotrimazole-betamethasone (LOTRISONE) cream Apply 1 application topically 2 (two) times daily. 08/28/15   Reva Bores, MD  erythromycin ophthalmic ointment Place a 1/2 inch ribbon of ointment into the lower eyelid four times a day for 7-10 days. 12/23/15   Rise Mu, PA-C  fluticasone (FLONASE) 50 MCG/ACT nasal spray Place 2 sprays into both nostrils at bedtime. 11/27/14   Ozella Rocks, MD  ibuprofen (ADVIL,MOTRIN) 800 MG tablet Take 1 tablet (800 mg total) by mouth every 8 (eight) hours as needed. 10/09/15   Peggy Constant, MD  ipratropium (ATROVENT) 0.06 % nasal spray Place 2 sprays into both nostrils 4 (four) times  daily. 11/27/14   Ozella Rocks, MD  medroxyPROGESTERone (DEPO-PROVERA) 150 MG/ML injection Inject 1 mL (150 mg total) into the muscle every 3 (three) months. 08/16/15   Reva Bores, MD  metroNIDAZOLE (FLAGYL) 500 MG tablet Take 4 tablets (2g) by mouth at once. 08/29/15   Reva Bores, MD  Multiple Vitamin (MULTIVITAMIN) tablet Take 1 tablet by mouth every morning.     Historical Provider, MD    Family History Family History  Problem Relation Age of Onset  . Diabetes Maternal Grandfather   . Hypertension Maternal Grandfather   . Hyperlipidemia Mother   . Hypertension Mother     Social History Social History  Substance Use Topics  . Smoking status: Current Every Day Smoker    Packs/day: 0.25  . Smokeless tobacco: Never Used  . Alcohol use 0.6 oz/week    1 Cans of beer per week     Comment: occasional     Allergies   Minocycline and Diflucan [fluconazole]   Review of Systems Review of Systems  Constitutional: Negative for chills and fever.  HENT: Negative for congestion, ear pain, rhinorrhea, sinus pressure, sneezing and sore throat.   Eyes: Positive for discharge and itching. Negative for pain, redness and visual disturbance.  Respiratory: Negative for cough and shortness of breath.  Cardiovascular: Negative for chest pain and palpitations.  Gastrointestinal: Negative for abdominal pain and vomiting.  Genitourinary: Negative for dysuria and hematuria.  Musculoskeletal: Negative for arthralgias and back pain.  Skin: Negative for color change and rash.  Neurological: Negative for headaches.  All other systems reviewed and are negative.    Physical Exam Updated Vital Signs BP 131/73 (BP Location: Left Arm)   Pulse 97   Temp 98.4 F (36.9 C) (Oral)   Resp 16   Ht 5' 1.5" (1.562 m)   Wt 104.8 kg   SpO2 100%   BMI 42.94 kg/m   Physical Exam  Constitutional: She appears well-developed and well-nourished. No distress.  Eyes: Conjunctivae and EOM are normal.  Pupils are equal, round, and reactive to light. Right eye exhibits no discharge. Left eye exhibits no discharge. Right conjunctiva is not injected. Left conjunctiva is not injected. No scleral icterus.  Left eye lid with erythema, and mild edema, and crusting noted with fluid like cyst, no area of fluctuance appreciated.  Pulmonary/Chest: No respiratory distress.  Musculoskeletal: Normal range of motion.  Neurological: She is alert.  Skin: No pallor.  Nursing note and vitals reviewed.      ED Treatments / Results  Labs (all labs ordered are listed, but only abnormal results are displayed) Labs Reviewed - No data to display  EKG  EKG Interpretation None       Radiology No results found.  Procedures Procedures (including critical care time)  Medications Ordered in ED Medications - No data to display   Initial Impression / Assessment and Plan / ED Course  I have reviewed the triage vital signs and the nursing notes.  Pertinent labs & imaging results that were available during my care of the patient were reviewed by me and considered in my medical decision making (see chart for details).  Clinical Course  Patient presents with left eye lid edema and erythema. No vision changes. Likely blepharitis. Patient has been trying warm compresses for 3 days with little relief. Patient given prescription for erythromycin opth ointment. Encouraged patient to continue to use warm compresses. If symptoms don't improve follow up with optho.  Final Clinical Impressions(s) / ED Diagnoses   Final diagnoses:  Blepharitis, left    New Prescriptions Discharge Medication List as of 12/23/2015 10:10 PM    START taking these medications   Details  erythromycin ophthalmic ointment Place a 1/2 inch ribbon of ointment into the lower eyelid four times a day for 7-10 days., Print         Rise MuKenneth T Ailyne Pawley, PA-C 12/24/15 0005    Nira ConnPedro Eduardo Cardama, MD 12/24/15 [phone]

## 2016-01-07 ENCOUNTER — Telehealth: Payer: Self-pay | Admitting: *Deleted

## 2016-01-07 ENCOUNTER — Encounter: Payer: Self-pay | Admitting: *Deleted

## 2016-01-07 DIAGNOSIS — Z3042 Encounter for surveillance of injectable contraceptive: Secondary | ICD-10-CM

## 2016-01-07 MED ORDER — MEDROXYPROGESTERONE ACETATE 150 MG/ML IM SUSP: 150.0000 mg | INTRAMUSCULAR | 2 refills | Status: DC

## 2016-01-07 NOTE — Telephone Encounter (Signed)
Depo Provera 150mg  sent to pharmacy for pt to pick up to bring to office for injection.

## 2016-01-07 NOTE — Telephone Encounter (Signed)
-----   Message from Olevia BowensJacinda S Battle sent at 01/07/2016  4:14 PM EDT ----- Regarding: Rx Request Contact: [phone] Wants to know if you can call in her Depo to [facility] on [address]

## 2016-01-17 ENCOUNTER — Ambulatory Visit: Payer: BLUE CROSS/BLUE SHIELD

## 2016-01-23 ENCOUNTER — Ambulatory Visit: Payer: BLUE CROSS/BLUE SHIELD

## 2016-01-24 ENCOUNTER — Ambulatory Visit: Payer: BLUE CROSS/BLUE SHIELD

## 2016-04-22 DIAGNOSIS — R002 Palpitations: Secondary | ICD-10-CM | POA: Insufficient documentation

## 2016-04-22 DIAGNOSIS — F17200 Nicotine dependence, unspecified, uncomplicated: Secondary | ICD-10-CM | POA: Insufficient documentation

## 2016-04-22 DIAGNOSIS — R0602 Shortness of breath: Secondary | ICD-10-CM | POA: Insufficient documentation

## 2016-06-18 ENCOUNTER — Encounter (HOSPITAL_BASED_OUTPATIENT_CLINIC_OR_DEPARTMENT_OTHER): Payer: Self-pay | Admitting: Emergency Medicine

## 2016-06-18 ENCOUNTER — Emergency Department (HOSPITAL_BASED_OUTPATIENT_CLINIC_OR_DEPARTMENT_OTHER)
Admission: EM | Admit: 2016-06-18 | Discharge: 2016-06-18 | Disposition: A | Discharge status: Home / Self Care | Payer: BLUE CROSS/BLUE SHIELD | Attending: Emergency Medicine | Admitting: Emergency Medicine

## 2016-06-18 DIAGNOSIS — F17200 Nicotine dependence, unspecified, uncomplicated: Secondary | ICD-10-CM | POA: Insufficient documentation

## 2016-06-18 DIAGNOSIS — J069 Acute upper respiratory infection, unspecified: Secondary | ICD-10-CM | POA: Insufficient documentation

## 2016-06-18 MED ORDER — AMOXICILLIN 500 MG PO CAPS: 500.0000 mg | ORAL_CAPSULE | Freq: Three times a day (TID) | ORAL | 0 refills | Status: DC

## 2016-06-18 NOTE — ED Triage Notes (Signed)
Sinus pressure since Sunday. Not taking OTC meds. Denies other symptoms.

## 2016-06-18 NOTE — ED Provider Notes (Signed)
[facility]   CSN: [id number] Arrival date & time: 06/18/16  1021     History   Chief Complaint Chief Complaint  Patient presents with  . sinus pressure    HPI Diane Stokes is a 26 y.o. female.  Patient is a 26 year old female with history of seasonal allergies. She presents with complaints of URI symptoms for the past week. She reports facial pressure consistent with her prior sinus infections. She denies any fevers or chills. She denies any chest pain, difficulty breathing, or productive cough   The history is provided by the patient.  URI   This is a new problem. Episode onset: 5 days ago. The problem has been gradually worsening. There has been no fever. Associated symptoms include sinus pain. Pertinent negatives include no headaches.    Past Medical History:  Diagnosis Date  . HPV (human papilloma virus) infection   . Seasonal allergies     Patient Active Problem List   Diagnosis Date Noted  . Severe obesity (BMI >= 40) (HCC) 12/27/2013  . Morbid obesity (HCC) 05/19/2013  . Abnormal Pap smear of cervix 05/19/2013    History reviewed. No pertinent surgical history.  OB History    Gravida Para Term Preterm AB Living   0             SAB TAB Ectopic Multiple Live Births                   Home Medications    Prior to Admission medications   Medication Sig Start Date End Date Taking? Authorizing Provider  clotrimazole-betamethasone (LOTRISONE) cream Apply 1 application topically 2 (two) times daily. 08/28/15   Reva Bores, MD  erythromycin ophthalmic ointment Place a 1/2 inch ribbon of ointment into the lower eyelid four times a day for 7-10 days. 12/23/15   Rise Mu, PA-C  fluticasone (FLONASE) 50 MCG/ACT nasal spray Place 2 sprays into both nostrils at bedtime. 11/27/14   Ozella Rocks, MD  ibuprofen (ADVIL,MOTRIN) 800 MG tablet Take 1 tablet (800 mg total) by mouth every 8 (eight) hours as needed. 10/09/15   Peggy Constant,  MD  ipratropium (ATROVENT) 0.06 % nasal spray Place 2 sprays into both nostrils 4 (four) times daily. 11/27/14   Ozella Rocks, MD  medroxyPROGESTERone (DEPO-PROVERA) 150 MG/ML injection Inject 1 mL (150 mg total) into the muscle every 3 (three) months. 01/07/16   Reva Bores, MD  metroNIDAZOLE (FLAGYL) 500 MG tablet Take 4 tablets (2g) by mouth at once. 08/29/15   Reva Bores, MD  Multiple Vitamin (MULTIVITAMIN) tablet Take 1 tablet by mouth every morning.     Historical Provider, MD    Family History Family History  Problem Relation Age of Onset  . Hyperlipidemia Mother   . Hypertension Mother   . Diabetes Maternal Grandfather   . Hypertension Maternal Grandfather     Social History Social History  Substance Use Topics  . Smoking status: Current Every Day Smoker    Packs/day: 0.25  . Smokeless tobacco: Never Used  . Alcohol use 0.6 oz/week    1 Cans of beer per week     Comment: occasional     Allergies   Minocycline and Diflucan [fluconazole]   Review of Systems Review of Systems  HENT: Positive for sinus pain.   Neurological: Negative for headaches.  All other systems reviewed and are negative.    Physical Exam Updated Vital Signs BP 135/90 (BP Location:  Left Arm)   Pulse 89   Temp 98.3 F (36.8 C) (Oral)   Resp 18   Ht 5\' 2"  (1.575 m)   Wt 243 lb (110.2 kg)   LMP 06/04/2016   SpO2 100%   BMI 44.45 kg/m   Physical Exam  Constitutional: She is oriented to person, place, and time. She appears well-developed and well-nourished. No distress.  HENT:  Head: Normocephalic and atraumatic.  Mouth/Throat: Oropharynx is clear and moist.  TMs are clear bilaterally.  Neck: Normal range of motion. Neck supple.  Cardiovascular: Normal rate and regular rhythm.  Exam reveals no gallop and no friction rub.   No murmur heard. Pulmonary/Chest: Effort normal and breath sounds normal. No respiratory distress. She has no wheezes.  Abdominal: Soft. Bowel sounds are  normal. She exhibits no distension. There is no tenderness.  Musculoskeletal: Normal range of motion.  Neurological: She is alert and oriented to person, place, and time.  Skin: Skin is warm and dry. She is not diaphoretic.  Nursing note and vitals reviewed.    ED Treatments / Results  Labs (all labs ordered are listed, but only abnormal results are displayed) Labs Reviewed - No data to display  EKG  EKG Interpretation None       Radiology No results found.  Procedures Procedures (including critical care time)  Medications Ordered in ED Medications - No data to display   Initial Impression / Assessment and Plan / ED Course  I have reviewed the triage vital signs and the nursing notes.  Pertinent labs & imaging results that were available during my care of the patient were reviewed by me and considered in my medical decision making (see chart for details).  Patient presents with URI symptoms and sinus congestion for one week. Symptoms most likely viral in nature, however patient reports a history of sinus infections and believe she is developing another. I will recommend over-the-counter medications, but will prescribe amoxicillin which she can fill if she is not improving in the next 2-3 days.  Final Clinical Impressions(s) / ED Diagnoses   Final diagnoses:  None    New Prescriptions New Prescriptions   No medications on file     Geoffery Lyonsouglas Sahej Schrieber, MD 06/18/16 1110

## 2016-06-18 NOTE — Discharge Instructions (Signed)
Continue over-the-counter medications as needed for symptom relief.  If your symptoms are not improving in the next 3 days, prescription for amoxicillin you have been given today.  Return to the Emergency Department if your symptoms significantly worsen or change.

## 2016-08-25 ENCOUNTER — Encounter (HOSPITAL_BASED_OUTPATIENT_CLINIC_OR_DEPARTMENT_OTHER): Payer: Self-pay | Admitting: Emergency Medicine

## 2016-08-25 ENCOUNTER — Emergency Department (HOSPITAL_BASED_OUTPATIENT_CLINIC_OR_DEPARTMENT_OTHER)
Admission: EM | Admit: 2016-08-25 | Discharge: 2016-08-25 | Disposition: A | Discharge status: Home / Self Care | Payer: BLUE CROSS/BLUE SHIELD | Attending: Emergency Medicine | Admitting: Emergency Medicine

## 2016-08-25 DIAGNOSIS — F17200 Nicotine dependence, unspecified, uncomplicated: Secondary | ICD-10-CM | POA: Insufficient documentation

## 2016-08-25 DIAGNOSIS — R21 Rash and other nonspecific skin eruption: Secondary | ICD-10-CM

## 2016-08-25 MED ORDER — PREDNISONE 20 MG PO TABS
ORAL_TABLET | ORAL | 0 refills | Status: DC
Start: 2016-08-25 — End: 2018-11-11

## 2016-08-25 NOTE — ED Triage Notes (Signed)
Rash since yesterday.

## 2016-08-25 NOTE — ED Provider Notes (Signed)
[facility]   CSN: [id number] Arrival date & time: 08/25/16  [id number]     History   Chief Complaint Chief Complaint  Patient presents with  . Rash    HPI Diane Stokes is a 26 y.o. female.  HPI complains of pruritic rash on trunk which started yesterday. She treated herself with Benadryl, without relief. No fever. She does not appear ill. No shortness of breath no difficulty swallowing no other associated symptoms  Past Medical History:  Diagnosis Date  . HPV (human papilloma virus) infection   . Seasonal allergies     Patient Active Problem List   Diagnosis Date Noted  . Severe obesity (BMI >= 40) (HCC) 12/27/2013  . Morbid obesity (HCC) 05/19/2013  . Abnormal Pap smear of cervix 05/19/2013    History reviewed. No pertinent surgical history.  OB History    Gravida Para Term Preterm AB Living   0             SAB TAB Ectopic Multiple Live Births                   Home Medications    Prior to Admission medications   Medication Sig Start Date End Date Taking? Authorizing Provider  amoxicillin (AMOXIL) 500 MG capsule Take 1 capsule (500 mg total) by mouth 3 (three) times daily. 06/18/16   Geoffery Lyonselo, Douglas, MD  clotrimazole-betamethasone (LOTRISONE) cream Apply 1 application topically 2 (two) times daily. 08/28/15   Reva BoresPratt, Tanya S, MD  erythromycin ophthalmic ointment Place a 1/2 inch ribbon of ointment into the lower eyelid four times a day for 7-10 days. 12/23/15   Rise MuLeaphart, Kenneth T, PA-C  fluticasone (FLONASE) 50 MCG/ACT nasal spray Place 2 sprays into both nostrils at bedtime. 11/27/14   Ozella RocksMerrell, David J, MD  ibuprofen (ADVIL,MOTRIN) 800 MG tablet Take 1 tablet (800 mg total) by mouth every 8 (eight) hours as needed. 10/09/15   Constant, Peggy, MD  ipratropium (ATROVENT) 0.06 % nasal spray Place 2 sprays into both nostrils 4 (four) times daily. 11/27/14   Ozella RocksMerrell, David J, MD  medroxyPROGESTERone (DEPO-PROVERA) 150 MG/ML injection Inject 1 mL (150 mg  total) into the muscle every 3 (three) months. 01/07/16   Reva BoresPratt, Tanya S, MD  metroNIDAZOLE (FLAGYL) 500 MG tablet Take 4 tablets (2g) by mouth at once. 08/29/15   Reva BoresPratt, Tanya S, MD  Multiple Vitamin (MULTIVITAMIN) tablet Take 1 tablet by mouth every morning.     [provider]  predniSONE (DELTASONE) 20 MG tablet 2 tabs po daily x 4 days 08/25/16   Doug SouJacubowitz, Tjay Velazquez, MD    Family History Family History  Problem Relation Age of Onset  . Hyperlipidemia Mother   . Hypertension Mother   . Diabetes Maternal Grandfather   . Hypertension Maternal Grandfather     Social History Social History  Substance Use Topics  . Smoking status: Current Every Day Smoker    Packs/day: 0.25  . Smokeless tobacco: Never Used  . Alcohol use 0.6 oz/week    1 Cans of beer per week     Comment: occasional   Denies illicit drug  Allergies   Minocycline and Diflucan [fluconazole]   Review of Systems Review of Systems  Constitutional: Negative.   HENT: Negative.   Respiratory: Negative.   Cardiovascular: Negative.   Gastrointestinal: Negative.   Musculoskeletal: Negative.   Skin: Positive for rash.  Neurological: Negative.   Psychiatric/Behavioral: Negative.   All other systems reviewed and are negative.  Physical Exam Updated Vital Signs BP 120/80   Pulse (!) 109   Temp 98.8 F (37.1 C) (Oral)   Resp 18   Ht 5' 1.5" (1.562 m)   Wt 236 lb (107 kg)   LMP 08/25/2016   SpO2 100%   BMI 43.87 kg/m   Physical Exam  Constitutional: She appears well-developed and well-nourished.  HENT:  Head: Normocephalic and atraumatic.  No mucosal week  Eyes: Conjunctivae are normal. Pupils are equal, round, and reactive to light.  Neck: Neck supple. No tracheal deviation present. No thyromegaly present.  Cardiovascular: Regular rhythm.   No murmur heard. Mildly tachycardic. Pulse, 100 bpm by me  Pulmonary/Chest: Effort normal and breath sounds normal.  Abdominal: Soft. Bowel sounds are  normal. She exhibits no distension. There is no tenderness.  Obese  Musculoskeletal: Normal range of motion. She exhibits no edema or tenderness.  Neurological: She is alert. Coordination normal.  Skin: Skin is warm and dry. No rash noted.  Hive-like lesions on abdomen and chest. She also has first-degree sunburn on shoulders and legs. No lesions on the palms or soles  Psychiatric: She has a normal mood and affect.  Nursing note and vitals reviewed.    ED Treatments / Results  Labs (all labs ordered are listed, but only abnormal results are displayed) Labs Reviewed - No data to display  EKG  EKG Interpretation None       Radiology No results found.  Procedures Procedures (including critical care time)  Medications Ordered in ED Medications - No data to display   Initial Impression / Assessment and Plan / ED Course  I have reviewed the triage vital signs and the nursing notes.  Pertinent labs & imaging results that were available during my care of the patient were reviewed by me and considered in my medical decision making (see chart for details).     I suspect patient has allergic etiology of rash. Plan prescription prednisone. Continue Benadryl. Referral Dr. Terri Piedra as needed  Final Clinical Impressions(s) / ED Diagnoses  Diagnosis rash Final diagnoses:  Rash    New Prescriptions New Prescriptions   PREDNISONE (DELTASONE) 20 MG TABLET    2 tabs po daily x 4 days     Doug Sou, MD 08/25/16 [phone]

## 2016-08-25 NOTE — Discharge Instructions (Signed)
Continue to take Benadryl 50 mg 4 times daily as needed for itch. Benadryl can cause drowsiness. Don't drive or operate machinery while taking Benadryl. Take the medication prescribed starting today. Call Dr.Lupton to schedule an office visit if not improving in 2 days. Return if you develop difficulty breathing, difficulty swallowing or if concern for any reason

## 2017-01-21 ENCOUNTER — Emergency Department (HOSPITAL_COMMUNITY)
Admission: EM | Admit: 2017-01-21 | Discharge: 2017-01-21 | Disposition: A | Discharge status: Home / Self Care | Payer: Self-pay | Attending: Emergency Medicine | Admitting: Emergency Medicine

## 2017-01-21 ENCOUNTER — Encounter (HOSPITAL_COMMUNITY): Payer: Self-pay | Admitting: Emergency Medicine

## 2017-01-21 ENCOUNTER — Emergency Department (HOSPITAL_COMMUNITY): Payer: Self-pay

## 2017-01-21 DIAGNOSIS — Z79899 Other long term (current) drug therapy: Secondary | ICD-10-CM | POA: Insufficient documentation

## 2017-01-21 DIAGNOSIS — R109 Unspecified abdominal pain: Secondary | ICD-10-CM

## 2017-01-21 DIAGNOSIS — F17200 Nicotine dependence, unspecified, uncomplicated: Secondary | ICD-10-CM | POA: Insufficient documentation

## 2017-01-21 DIAGNOSIS — M25562 Pain in left knee: Secondary | ICD-10-CM | POA: Insufficient documentation

## 2017-01-21 MED ORDER — IBUPROFEN 800 MG PO TABS: 800.0000 mg | ORAL_TABLET | Freq: Three times a day (TID) | ORAL | 0 refills | Status: DC | PRN

## 2017-01-21 MED ORDER — CYCLOBENZAPRINE HCL 10 MG PO TABS: 10.0000 mg | ORAL_TABLET | Freq: Two times a day (BID) | ORAL | 0 refills | Status: DC | PRN

## 2017-01-21 NOTE — ED Provider Notes (Signed)
[facility] Provider Note   CSN: [id number] Arrival date & time: 01/21/17  1227     History   Chief Complaint Chief Complaint  Patient presents with  . Knee Pain    HPI Diane Stokes is a 26 y.o. female.  HPI   26 year old female with history of obesity presenting complaining of left knee pain.patient report for the past month and a half she has had intermittent pain to the back of her left knee radiates to her calf. Her pain is described as a sharp and throbbing sensation with associated swelling.Pain has increased in severity not improved with ice and rest. Pain worse with prolonged standing. She mentioned that her knee gave out on her several days prior causing her to fall. She denies any associated back hip or ankle pain. Denies any right knee pain. No prior injury to the same knee. No prior history of PE or DVT, no chest pain trouble breathing hemoptysis or taking oral hormone.she did report resuming her job and having to stand 8 hours a day and think that it may contribute to her knee pain.  Past Medical History:  Diagnosis Date  . HPV (human papilloma virus) infection   . Seasonal allergies     Patient Active Problem List   Diagnosis Date Noted  . Severe obesity (BMI >= 40) (HCC) 12/27/2013  . Morbid obesity (HCC) 05/19/2013  . Abnormal Pap smear of cervix 05/19/2013    Past Surgical History:  Procedure Laterality Date  . FOOT SURGERY      OB History    Gravida Para Term Preterm AB Living   0         0   SAB TAB Ectopic Multiple Live Births                   Home Medications    Prior to Admission medications   Medication Sig Start Date End Date Taking? Authorizing Provider  amoxicillin (AMOXIL) 500 MG capsule Take 1 capsule (500 mg total) by mouth 3 (three) times daily. 06/18/16   Geoffery Lyons, MD  clotrimazole-betamethasone (LOTRISONE) cream Apply 1 application topically 2 (two) times daily. 08/28/15   Reva Bores, MD  erythromycin ophthalmic  ointment Place a 1/2 inch ribbon of ointment into the lower eyelid four times a day for 7-10 days. 12/23/15   Rise Mu, PA-C  fluticasone (FLONASE) 50 MCG/ACT nasal spray Place 2 sprays into both nostrils at bedtime. 11/27/14   Ozella Rocks, MD  ibuprofen (ADVIL,MOTRIN) 800 MG tablet Take 1 tablet (800 mg total) by mouth every 8 (eight) hours as needed. 10/09/15   Constant, Peggy, MD  ipratropium (ATROVENT) 0.06 % nasal spray Place 2 sprays into both nostrils 4 (four) times daily. 11/27/14   Ozella Rocks, MD  medroxyPROGESTERone (DEPO-PROVERA) 150 MG/ML injection Inject 1 mL (150 mg total) into the muscle every 3 (three) months. 01/07/16   Reva Bores, MD  metroNIDAZOLE (FLAGYL) 500 MG tablet Take 4 tablets (2g) by mouth at once. 08/29/15   Reva Bores, MD  Multiple Vitamin (MULTIVITAMIN) tablet Take 1 tablet by mouth every morning.     [provider]  predniSONE (DELTASONE) 20 MG tablet 2 tabs po daily x 4 days 08/25/16   Doug Sou, MD    Family History Family History  Problem Relation Age of Onset  . Hyperlipidemia Mother   . Hypertension Mother   . Diabetes Maternal Grandfather   . Hypertension Maternal Grandfather  Social History Social History  Substance Use Topics  . Smoking status: Current Every Day Smoker    Packs/day: 0.25  . Smokeless tobacco: Never Used  . Alcohol use 0.6 oz/week    1 Cans of beer per week     Comment: occasional     Allergies   Minocycline and Diflucan [fluconazole]   Review of Systems Review of Systems  All other systems reviewed and are negative.    Physical Exam Updated Vital Signs BP (!) 143/95 (BP Location: Right Arm)   Pulse (!) 102   Temp 98.3 F (36.8 C) (Oral)   Resp 18   Ht 5' 1.5" (1.562 m)   Wt 111.7 kg (246 lb 5 oz)   LMP 12/27/2016   SpO2 100%   BMI 45.79 kg/m   Physical Exam  Constitutional: She appears well-developed and well-nourished. No distress.  obese female resting comfortably  in no acute discomfort.  HENT:  Head: Atraumatic.  Eyes: Conjunctivae are normal.  Neck: Neck supple.  Cardiovascular: Normal rate and regular rhythm.   Pulmonary/Chest: Effort normal and breath sounds normal.  Musculoskeletal: She exhibits tenderness (left lower extremity: Tenderness to posterior knee on palpation without noted. Mild tenderness to posterior calf. No edema appreciated pedal pulse palpable. Left knee with normal flexion and extension no gross deformity.).  Neurological: She is alert.  Skin: No rash noted.  Psychiatric: She has a normal mood and affect.  Nursing note and vitals reviewed.    ED Treatments / Results  Labs (all labs ordered are listed, but only abnormal results are displayed) Labs Reviewed - No data to display  EKG  EKG Interpretation None       Radiology US Venous Img Lower  Left (dvt Study)  Result Date: 01/21/2017 CLINICAL DATA:  Left lower extremity pain and swelling over the last month. EXAM: Left LOWER EXTREMITY VENOUS DOPPLER ULTRASOUND TECHNIQUE: Gray-scale sonography with graded compression, as well as color Doppler and duplex ultrasound were performed to evaluate the lower extremity deep venous systems from the level of the common femoral vein and including the common femoral, femoral, profunda femoral, popliteal and calf veins including the posterior tibial, peroneal and gastrocnemius veins when visible. The superficial great saphenous vein was also interrogated. Spectral Doppler was utilized to evaluate flow at rest and with distal augmentation maneuvers in the common femoral, femoral and popliteal veins. COMPARISON:  None. FINDINGS: Contralateral Common Femoral Vein: Respiratory phasicity is normal and symmetric with the symptomatic side. No evidence of thrombus. Normal compressibility. Common Femoral Vein: No evidence of thrombus. Normal compressibility, respiratory phasicity and response to augmentation. Saphenofemoral Junction: No evidence of  thrombus. Normal compressibility and flow on color Doppler imaging. Profunda Femoral Vein: No evidence of thrombus. Normal compressibility and flow on color Doppler imaging. Femoral Vein: No evidence of thrombus. Normal compressibility, respiratory phasicity and response to augmentation. Popliteal Vein: No evidence of thrombus. Normal compressibility, respiratory phasicity and response to augmentation. Calf Veins: No evidence of thrombus. Normal compressibility and flow on color Doppler imaging. Superficial Great Saphenous Vein: No evidence of thrombus. Normal compressibility and flow on color Doppler imaging. Venous Reflux:  None. Other Findings:  None. IMPRESSION: No evidence of DVT within the left lower extremity. Electronically Signed   By: Paulina Fusi M.D.   On: 01/21/2017 15:43   Dg Knee Complete 4 Views Left  Result Date: 01/21/2017 CLINICAL DATA:  Left knee pain and swelling. EXAM: LEFT KNEE - COMPLETE 4+ VIEW COMPARISON:  None. FINDINGS: No evidence of  fracture, dislocation, or joint effusion. No evidence of arthropathy or other focal bone abnormality. Soft tissues are unremarkable. IMPRESSION: Negative. Electronically Signed   By: Obie Dredge M.D.   On: 01/21/2017 14:46    Procedures Procedures (including critical care time)  Medications Ordered in ED Medications - No data to display   Initial Impression / Assessment and Plan / ED Course  I have reviewed the triage vital signs and the nursing notes.  Pertinent labs & imaging results that were available during my care of the patient were reviewed by me and considered in my medical decision making (see chart for details).     BP (!) 143/95 (BP Location: Right Arm)   Pulse (!) 102   Temp 98.3 F (36.8 C) (Oral)   Resp 18   Ht 5' 1.5" (1.562 m)   Wt 111.7 kg (246 lb 5 oz)   LMP 12/27/2016   SpO2 100%   BMI 45.79 kg/m    Final Clinical Impressions(s) / ED Diagnoses   Final diagnoses:  Acute pain of left knee    New  Prescriptions New Prescriptions   CYCLOBENZAPRINE (FLEXERIL) 10 MG TABLET    Take 1 tablet (10 mg total) by mouth 2 (two) times daily as needed for muscle spasms.   3:09 PM Patient presents with atraumatic right knee and right calf pain for more than a month.Initial x-ray of her knee shows no acute abnormalities. She is morbidly obese for her pain may be coughed nausea now plan to obtain a venous Doppler study to rule out DVT causing her pain.  3:58 PM DVT study negative.  Pt reassured.  Ace wrap applied for support.  RICE therapy discussed. Orthopedic referral given.  Return precaution discussed.   Fayrene Helper, PA-C 01/21/17 1558    Donnetta Hutching, MD 01/24/17 1146

## 2017-01-21 NOTE — ED Notes (Signed)
Pt transported to US

## 2017-01-21 NOTE — Discharge Instructions (Signed)
You have been evaluated for your knee pain.  Your xray and your ultrasound did not show any concerning finding.  Please follow up with orthopedist for further evaluation of your pain.

## 2017-01-21 NOTE — ED Triage Notes (Signed)
PT c/o left knee pain and swelling for over the past month with no new knee injury. PT ambulatory in triage.

## 2017-01-21 NOTE — ED Notes (Signed)
Pt transported to XRAY °

## 2017-02-04 ENCOUNTER — Ambulatory Visit: Payer: Self-pay | Admitting: Orthopedic Surgery

## 2017-02-16 ENCOUNTER — Ambulatory Visit: Payer: Self-pay | Admitting: Orthopedic Surgery

## 2017-02-16 ENCOUNTER — Encounter: Payer: Self-pay | Admitting: Orthopedic Surgery

## 2017-06-10 ENCOUNTER — Emergency Department: Payer: Self-pay

## 2017-06-10 ENCOUNTER — Other Ambulatory Visit: Payer: Self-pay

## 2017-06-10 ENCOUNTER — Emergency Department
Admission: EM | Admit: 2017-06-10 | Discharge: 2017-06-10 | Disposition: A | Discharge status: Home / Self Care | Payer: Self-pay | Attending: Emergency Medicine | Admitting: Emergency Medicine

## 2017-06-10 ENCOUNTER — Encounter: Payer: Self-pay | Admitting: *Deleted

## 2017-06-10 DIAGNOSIS — M545 Low back pain, unspecified: Secondary | ICD-10-CM

## 2017-06-10 DIAGNOSIS — Z79899 Other long term (current) drug therapy: Secondary | ICD-10-CM | POA: Insufficient documentation

## 2017-06-10 DIAGNOSIS — F17210 Nicotine dependence, cigarettes, uncomplicated: Secondary | ICD-10-CM | POA: Insufficient documentation

## 2017-06-10 LAB — URINALYSIS, COMPLETE (UACMP) WITH MICROSCOPIC
Bilirubin Urine: NEGATIVE
Glucose, UA: NEGATIVE mg/dL
Hgb urine dipstick: NEGATIVE
Ketones, ur: NEGATIVE mg/dL
Nitrite: NEGATIVE
Protein, ur: 30 mg/dL — AB
Specific Gravity, Urine: 1.031 — ABNORMAL HIGH (ref 1.005–1.030)
pH: 5 (ref 5.0–8.0)

## 2017-06-10 LAB — POC URINE PREG, ED: Preg Test, Ur: NEGATIVE

## 2017-06-10 MED ORDER — MELOXICAM 15 MG PO TABS: 15.0000 mg | ORAL_TABLET | Freq: Every day | ORAL | 1 refills | Status: AC

## 2017-06-10 MED ORDER — CYCLOBENZAPRINE HCL 5 MG PO TABS: 5.0000 mg | ORAL_TABLET | Freq: Three times a day (TID) | ORAL | 0 refills | Status: AC | PRN

## 2017-06-10 NOTE — ED Triage Notes (Signed)
Pt reports slipping in the shower yesterday and today has worsening pain in lower back. No head trauma reported and no LOC.

## 2017-06-10 NOTE — ED Provider Notes (Signed)
[facility] Emergency Department Provider Note  ____________________________________________  Time seen: Approximately 6:45 PM  I have reviewed the triage vital signs and the nursing notes.   HISTORY  Chief Complaint Back Pain    HPI Diane Stokes is a 27 y.o. female presents to the emergency department with 10 out of 10 low back pain after patient fell while getting out of the shower 3 days ago.  Patient denies radiculopathy, weakness or changes in sensation in the upper or lower extremities.  No neck pain.  Patient denies hitting her head.  She has been able to ambulate without difficulty.  She has been taking ibuprofen.   Past Medical History:  Diagnosis Date  . HPV (human papilloma virus) infection   . Seasonal allergies     Patient Active Problem List   Diagnosis Date Noted  . Severe obesity (BMI >= 40) (HCC) 12/27/2013  . Morbid obesity (HCC) 05/19/2013  . Abnormal Pap smear of cervix 05/19/2013    Past Surgical History:  Procedure Laterality Date  . FOOT SURGERY      Prior to Admission medications   Medication Sig Start Date End Date Taking? Authorizing Provider  amoxicillin (AMOXIL) 500 MG capsule Take 1 capsule (500 mg total) by mouth 3 (three) times daily. 06/18/16   Geoffery Lyons, MD  clotrimazole-betamethasone (LOTRISONE) cream Apply 1 application topically 2 (two) times daily. 08/28/15   Reva Bores, MD  cyclobenzaprine (FLEXERIL) 5 MG tablet Take 1 tablet (5 mg total) by mouth 3 (three) times daily as needed for up to 5 days for muscle spasms. 06/10/17 06/15/17  Orvil Feil, PA-C  erythromycin ophthalmic ointment Place a 1/2 inch ribbon of ointment into the lower eyelid four times a day for 7-10 days. 12/23/15   Rise Mu, PA-C  fluticasone (FLONASE) 50 MCG/ACT nasal spray Place 2 sprays into both nostrils at bedtime. 11/27/14   Ozella Rocks, MD  ibuprofen (ADVIL,MOTRIN) 800 MG tablet Take 1 tablet (800 mg total) by mouth  every 8 (eight) hours as needed for mild pain or moderate pain. 01/21/17   Fayrene Helper, PA-C  ipratropium (ATROVENT) 0.06 % nasal spray Place 2 sprays into both nostrils 4 (four) times daily. 11/27/14   Ozella Rocks, MD  medroxyPROGESTERone (DEPO-PROVERA) 150 MG/ML injection Inject 1 mL (150 mg total) into the muscle every 3 (three) months. 01/07/16   Reva Bores, MD  meloxicam (MOBIC) 15 MG tablet Take 1 tablet (15 mg total) by mouth daily for 7 days. 06/10/17 06/17/17  Orvil Feil, PA-C  metroNIDAZOLE (FLAGYL) 500 MG tablet Take 4 tablets (2g) by mouth at once. 08/29/15   Reva Bores, MD  Multiple Vitamin (MULTIVITAMIN) tablet Take 1 tablet by mouth every morning.     [provider]  predniSONE (DELTASONE) 20 MG tablet 2 tabs po daily x 4 days 08/25/16   Doug Sou, MD    Allergies Minocycline and Diflucan [fluconazole]  Family History  Problem Relation Age of Onset  . Hyperlipidemia Mother   . Hypertension Mother   . Diabetes Maternal Grandfather   . Hypertension Maternal Grandfather     Social History Social History   Tobacco Use  . Smoking status: Current Every Day Smoker    Packs/day: 0.25  . Smokeless tobacco: Never Used  Substance Use Topics  . Alcohol use: Yes    Alcohol/week: 0.6 oz    Types: 1 Cans of beer per week    Comment: occasional  .  Drug use: No     Review of Systems  Constitutional: No fever/chills Eyes: No visual changes. No discharge ENT: No upper respiratory complaints. Cardiovascular: no chest pain. Respiratory: no cough. No SOB. Gastrointestinal: No abdominal pain.  No nausea, no vomiting.  No diarrhea.  No constipation. Musculoskeletal: Patient has low back pain.  Skin: Negative for rash, abrasions, lacerations, ecchymosis. Neurological: Negative for headaches, focal weakness or numbness.   ____________________________________________   PHYSICAL EXAM:  VITAL SIGNS: ED Triage Vitals  Enc Vitals Group     BP 06/10/17  1449 131/82     Pulse Rate 06/10/17 1449 97     Resp 06/10/17 1449 18     Temp 06/10/17 1449 98.5 F (36.9 C)     Temp Source 06/10/17 1449 Oral     SpO2 06/10/17 1449 100 %     Weight 06/10/17 1450 239 lb (108.4 kg)     Height 06/10/17 1450 5\' 2"  (1.575 m)     Head Circumference --      Peak Flow --      Pain Score 06/10/17 1450 8     Pain Loc --      Pain Edu? --      Excl. in GC? --     Constitutional: Alert and oriented. Well appearing and in no acute distress. Eyes: Conjunctivae are normal. PERRL. EOMI. Head: Atraumatic. Cardiovascular: Normal rate, regular rhythm. Normal S1 and S2.  Good peripheral circulation. Respiratory: Normal respiratory effort without tachypnea or retractions. Lungs CTAB. Good air entry to the bases with no decreased or absent breath sounds. Musculoskeletal: Patient has tenderness elicited with paraspinal muscles along the lumbar spine.  Negative straight leg raise bilaterally. Neurologic:  Normal speech and language. No gross focal neurologic deficits are appreciated.  Skin:  Skin is warm, dry and intact. No rash noted.  ____________________________________________   LABS (all labs ordered are listed, but only abnormal results are displayed)  Labs Reviewed  URINALYSIS, COMPLETE (UACMP) WITH MICROSCOPIC - Abnormal; Notable for the following components:      Result Value   Color, Urine YELLOW (*)    APPearance TURBID (*)    Specific Gravity, Urine 1.031 (*)    Protein, ur 30 (*)    Leukocytes, UA TRACE (*)    Bacteria, UA FEW (*)    Squamous Epithelial / LPF 6-30 (*)    All other components within normal limits  POC URINE PREG, ED   ____________________________________________  EKG   ____________________________________________  RADIOLOGY Geraldo PitterI, Ashley Montminy M Anicka Stuckert, personally viewed and evaluated these images (plain radiographs) as part of my medical decision making, as well as reviewing the written report by the radiologist.  Dg Lumbar Spine  2-3 Views  Result Date: 06/10/2017 CLINICAL DATA:  Low back pain after fall yesterday in the shower. EXAM: LUMBAR SPINE - 2-3 VIEW COMPARISON:  None. FINDINGS: There is no evidence of lumbar spine fracture. Alignment is normal. Intervertebral disc spaces are maintained. IMPRESSION: Negative. Electronically Signed   By: Marnee SpringJonathon  Watts M.D.   On: 06/10/2017 15:56    ____________________________________________    PROCEDURES  Procedure(s) performed:    Procedures    Medications - No data to display   ____________________________________________   INITIAL IMPRESSION / ASSESSMENT AND PLAN / ED COURSE  Pertinent labs & imaging results that were available during my care of the patient were reviewed by me and considered in my medical decision making (see chart for details).  Review of the [place] CSRS was performed in  accordance of the [facility] prior to dispensing any controlled drugs.    Assessment and Plan:  Low Back Pain:  Patient presents to the emergency department with low back pain after a fall. Differential diagnosis includes fracture, lumbar strain and contusion.  History and physical exam findings are most consistent with contusion at this time.  Patient was discharged with meloxicam and Flexeril.  Vital signs are reassuring prior to discharge.  All patient questions were answered.    ____________________________________________  FINAL CLINICAL IMPRESSION(S) / ED DIAGNOSES  Final diagnoses:  Acute left-sided low back pain without sciatica      NEW MEDICATIONS STARTED DURING THIS VISIT:  ED Discharge Orders        Ordered    meloxicam (MOBIC) 15 MG tablet  Daily     06/10/17 1636    cyclobenzaprine (FLEXERIL) 5 MG tablet  3 times daily PRN     06/10/17 1636          This chart was dictated using voice recognition software/Dragon. Despite best efforts to proofread, errors can occur which can change the meaning. Any change was purely unintentional.    Orvil Feil, PA-C 06/10/17 1851    Myrna Blazer, MD 06/10/17 2200

## 2017-06-10 NOTE — ED Notes (Signed)
States she fell in shower on Sunday  Having lower back pain ambulates well  But states she has tried OTC meds and lidocaine cream

## 2018-03-29 ENCOUNTER — Other Ambulatory Visit: Payer: Self-pay

## 2018-03-29 ENCOUNTER — Encounter (HOSPITAL_COMMUNITY): Payer: Self-pay | Admitting: Emergency Medicine

## 2018-03-29 ENCOUNTER — Emergency Department (HOSPITAL_COMMUNITY)
Admission: EM | Admit: 2018-03-29 | Discharge: 2018-03-29 | Disposition: A | Discharge status: Home / Self Care | Payer: Non-veteran care | Attending: Emergency Medicine | Admitting: Emergency Medicine

## 2018-03-29 DIAGNOSIS — Y998 Other external cause status: Secondary | ICD-10-CM | POA: Insufficient documentation

## 2018-03-29 DIAGNOSIS — S39012A Strain of muscle, fascia and tendon of lower back, initial encounter: Secondary | ICD-10-CM

## 2018-03-29 DIAGNOSIS — Y9389 Activity, other specified: Secondary | ICD-10-CM | POA: Insufficient documentation

## 2018-03-29 DIAGNOSIS — Y929 Unspecified place or not applicable: Secondary | ICD-10-CM | POA: Diagnosis not present

## 2018-03-29 DIAGNOSIS — S3992XA Unspecified injury of lower back, initial encounter: Secondary | ICD-10-CM | POA: Diagnosis present

## 2018-03-29 DIAGNOSIS — X500XXA Overexertion from strenuous movement or load, initial encounter: Secondary | ICD-10-CM | POA: Insufficient documentation

## 2018-03-29 DIAGNOSIS — F17200 Nicotine dependence, unspecified, uncomplicated: Secondary | ICD-10-CM | POA: Diagnosis not present

## 2018-03-29 LAB — URINALYSIS, ROUTINE W REFLEX MICROSCOPIC
Bilirubin Urine: NEGATIVE
Glucose, UA: NEGATIVE mg/dL
HGB URINE DIPSTICK: NEGATIVE
Ketones, ur: NEGATIVE mg/dL
LEUKOCYTES UA: NEGATIVE
Nitrite: NEGATIVE
Protein, ur: NEGATIVE mg/dL
SPECIFIC GRAVITY, URINE: 1.002 — AB (ref 1.005–1.030)
pH: 8 (ref 5.0–8.0)

## 2018-03-29 LAB — PREGNANCY, URINE: PREG TEST UR: NEGATIVE

## 2018-03-29 MED ORDER — IBUPROFEN 800 MG PO TABS: 800.0000 mg | ORAL_TABLET | Freq: Three times a day (TID) | ORAL | 0 refills | Status: DC | PRN

## 2018-03-29 MED ORDER — DEXAMETHASONE 10 MG/ML FOR PEDIATRIC ORAL USE
10.0000 mg | Freq: Once | INTRAMUSCULAR | Status: AC
  Administered 2018-03-29: 10 mg via ORAL
  Filled 2018-03-29: qty 1

## 2018-03-29 MED ORDER — METHOCARBAMOL 500 MG PO TABS
500.0000 mg | ORAL_TABLET | Freq: Once | ORAL | Status: AC
  Administered 2018-03-29: 500 mg via ORAL
  Filled 2018-03-29: qty 1

## 2018-03-29 MED ORDER — IBUPROFEN 800 MG PO TABS
800.0000 mg | ORAL_TABLET | Freq: Once | ORAL | Status: AC
  Administered 2018-03-29: 800 mg via ORAL
  Filled 2018-03-29: qty 1

## 2018-03-29 MED ORDER — METHOCARBAMOL 500 MG PO TABS: 500.0000 mg | ORAL_TABLET | Freq: Three times a day (TID) | ORAL | 0 refills | Status: DC | PRN

## 2018-03-29 NOTE — ED Triage Notes (Signed)
Pt c/o right lower back pain that started one week ago. Pt denies any hematuria or dysuria. Pt states it hurts to sit at times.

## 2018-03-29 NOTE — Discharge Instructions (Signed)
You have been seen in the Emergency Department (ED)  today for back pain.  Your workup and exam have not shown any acute abnormalities and you are likely suffering from muscle strain or possible problems with your discs, but there is no treatment that will fix your symptoms at this time.  Please take Motrin (ibuprofen) as needed for your pain according to the instructions written on the box.  Alternatively, for the next five days you can take 600mg  three times daily with meals (it may upset your stomach).  Robaxin can make you sleepy. Do not drive or work while taking this medication.   Please follow up with your doctor as soon as possible regarding today's ED visit and your back pain.  Return to the ED for worsening back pain, fever, weakness or numbness of either leg, or if you develop either (1) an inability to urinate or have bowel movements, or (2) loss of your ability to control your bathroom functions (if you start having "accidents"), or if you develop other new symptoms that concern you.

## 2018-03-29 NOTE — ED Provider Notes (Signed)
Emergency Department Provider Note   I have reviewed the triage vital signs and the nursing notes.   HISTORY  Chief Complaint Back Pain   HPI Diane Stokes is a 27 y.o. female with PMH of seasonal allergies and elevated BMI presents to the emergency department for evaluation of lower back pain which is slightly worse on the right.  No radiation of pain down the right leg.  Patient denies any numbness, weakness symptoms.  No vaginal bleeding or discharge.  No dysuria but has had some urine frequency.  No sensation of urinary retention.  Symptoms have been gradually worsening over the past week.  She was helping her boyfriend move some heavy crates which seem to make pain worse yesterday and has continued today.  No prior history of kidney stone.  No fevers or chills but states that she had some flushing yesterday and that her lower back feels warm to touch.  Patient's symptoms are worse with movement or walking. Moderate severity. No other modifying factors.    Past Medical History:  Diagnosis Date  . HPV (human papilloma virus) infection   . Seasonal allergies     Patient Active Problem List   Diagnosis Date Noted  . Severe obesity (BMI >= 40) (HCC) 12/27/2013  . Morbid obesity (HCC) 05/19/2013  . Abnormal Pap smear of cervix 05/19/2013    Past Surgical History:  Procedure Laterality Date  . FOOT SURGERY     Allergies Minocycline and Diflucan [fluconazole]  Family History  Problem Relation Age of Onset  . Hyperlipidemia Mother   . Hypertension Mother   . Diabetes Maternal Grandfather   . Hypertension Maternal Grandfather     Social History Social History   Tobacco Use  . Smoking status: Current Every Day Smoker    Packs/day: 0.25  . Smokeless tobacco: Never Used  Substance Use Topics  . Alcohol use: Yes    Alcohol/week: 1.0 standard drinks    Types: 1 Cans of beer per week    Comment: occasional  . Drug use: No    Review of Systems  Constitutional: No  fever/chills Eyes: No visual changes. ENT: No sore throat. Cardiovascular: Denies chest pain. Respiratory: Denies shortness of breath. Gastrointestinal: No abdominal pain.  No nausea, no vomiting.  No diarrhea.  No constipation. Genitourinary: Negative for dysuria. Positive urine frequency.  Musculoskeletal: Positive for back pain. Skin: Negative for rash. Neurological: Negative for headaches, focal weakness or numbness.  10-point ROS otherwise negative.  ____________________________________________   PHYSICAL EXAM:  VITAL SIGNS: ED Triage Vitals  Enc Vitals Group     BP 03/29/18 2003 (!) 152/92     Pulse Rate 03/29/18 2003 (!) 113     Resp 03/29/18 2003 19     Temp 03/29/18 2003 98.2 F (36.8 C)     Temp Source 03/29/18 2003 Oral     SpO2 03/29/18 2003 98 %     Weight 03/29/18 2003 246 lb (111.6 kg)     Height 03/29/18 2003 5\' 6"  (1.676 m)     Pain Score 03/29/18 2004 8   Constitutional: Alert and oriented. Well appearing and in no acute distress. Eyes: Conjunctivae are normal. Head: Atraumatic. Nose: No congestion/rhinnorhea. Mouth/Throat: Mucous membranes are moist. Neck: No stridor.  Cardiovascular: Tachycardia. Good peripheral circulation. Grossly normal heart sounds.   Respiratory: Normal respiratory effort.  No retractions. Lungs CTAB. Gastrointestinal: Soft and nontender. No distention.  Musculoskeletal: No lower extremity tenderness nor edema. No gross deformities of extremities. Mild paraspinal  tenderness to palpation of the lumbar spine worse on the right. Normal ROM of the right hip.  Neurologic:  Normal speech and language. No gross focal neurologic deficits are appreciated. Normal strength and sensation in the bilateral LEs. Normal reflexes.  Skin:  Skin is warm, dry and intact. No rash noted.  ____________________________________________   LABS (all labs ordered are listed, but only abnormal results are displayed)  Labs Reviewed  URINALYSIS, ROUTINE W  REFLEX MICROSCOPIC - Abnormal; Notable for the following components:      Result Value   Color, Urine COLORLESS (*)    Specific Gravity, Urine 1.002 (*)    All other components within normal limits  PREGNANCY, URINE   ____________________________________________   PROCEDURES  Procedure(s) performed:   Procedures  None ____________________________________________   INITIAL IMPRESSION / ASSESSMENT AND PLAN / ED COURSE  Pertinent labs & imaging results that were available during my care of the patient were reviewed by me and considered in my medical decision making (see chart for details).  Patient presents to the emergency department for evaluation of lower back pain without radiation into the lower extremity.  My suspicion for cauda equina or acute cord compression is extremely low with normal neurological exam.  No midline spine tenderness.  Patient is low risk for discitis/epidural abscess.  No fevers at home but does note some flushing yesterday.  No UTI symptoms such as dysuria but has had some frequency.  Plan for follow UA along with urine pregnancy.  Triage vitals noted to have mild tachycardia with normal blood pressure monitor suspect is related to acute pain plan for MSK relaxer and NSAIDs pending pregnancy testing.   UA and pregnancy test reviewed. Plan for symptom mgmt at home. Advised not to drive while taking Robaxin. Discussed staying active and provided work note. Discussed ED return precautions and provided in writing at discharge as well.  ____________________________________________  FINAL CLINICAL IMPRESSION(S) / ED DIAGNOSES  Final diagnoses:  Strain of lumbar region, initial encounter     MEDICATIONS GIVEN DURING THIS VISIT:  Medications  ibuprofen (ADVIL,MOTRIN) tablet 800 mg (800 mg Oral Given 03/29/18 2140)  methocarbamol (ROBAXIN) tablet 500 mg (500 mg Oral Given 03/29/18 2140)  dexamethasone (DECADRON) 10 MG/ML injection for Pediatric ORAL use 10 mg  (10 mg Oral Given 03/29/18 2141)     NEW OUTPATIENT MEDICATIONS STARTED DURING THIS VISIT:  Discharge Medication List as of 03/29/2018  9:36 PM    START taking these medications   Details  methocarbamol (ROBAXIN) 500 MG tablet Take 1 tablet (500 mg total) by mouth every 8 (eight) hours as needed for muscle spasms., Starting Mon 03/29/2018, Print        Note:  This document was prepared using Dragon voice recognition software and may include unintentional dictation errors.  Alona Bene, MD Emergency Medicine    , Arlyss Repress, MD 03/30/18 [phone]

## 2018-11-09 ENCOUNTER — Encounter: Payer: Non-veteran care | Admitting: Obstetrics and Gynecology

## 2018-11-11 ENCOUNTER — Other Ambulatory Visit: Payer: Self-pay

## 2018-11-11 ENCOUNTER — Encounter: Payer: Self-pay | Admitting: Obstetrics & Gynecology

## 2018-11-11 ENCOUNTER — Ambulatory Visit (INDEPENDENT_AMBULATORY_CARE_PROVIDER_SITE_OTHER): Admitting: Obstetrics & Gynecology

## 2018-11-11 VITALS — BP 129/76 | HR 72 | Wt 253.0 lb

## 2018-11-11 DIAGNOSIS — Z30017 Encounter for initial prescription of implantable subdermal contraceptive: Secondary | ICD-10-CM

## 2018-11-11 DIAGNOSIS — Z3202 Encounter for pregnancy test, result negative: Secondary | ICD-10-CM

## 2018-11-11 LAB — POCT URINE PREGNANCY: Preg Test, Ur: NEGATIVE

## 2018-11-11 MED ORDER — ETONOGESTREL 68 MG ~~LOC~~ IMPL
68.0000 mg | DRUG_IMPLANT | Freq: Once | SUBCUTANEOUS | Status: AC
  Administered 2018-11-11: 68 mg via SUBCUTANEOUS

## 2018-11-11 NOTE — Progress Notes (Signed)
Last depo injection around April or May Last pap 04/24/2017- negative

## 2018-11-11 NOTE — Patient Instructions (Signed)
Nexplanon Instructions After Insertion  Keep bandage clean and dry for 24 hours  May use ice/Tylenol/Ibuprofen for soreness or pain  If you develop fever, drainage or increased warmth from incision site-contact office immediately   

## 2018-11-11 NOTE — Progress Notes (Signed)
     GYNECOLOGY OFFICE PROCEDURE NOTE  CALIROSE MCCANCE is a 28 y.o. G0P0 here for Nexplanon insertion.  Last pap smear was on 04/24/2017 and was normal.  No other gynecologic concerns.  Nexplanon Insertion Procedure Patient identified, informed consent performed, consent signed.   Patient does understand that irregular bleeding is a very common side effect of this medication. Other side effects discussed in detail.  Also discussed possible decreased efficacy given her morbid obesity, current body mass index is 40.84 kg/m. She was advised to have backup contraception for one week after placement; condoms also encouraged 100% of the time for STI prevention. Pregnancy test in clinic today was negative.  Appropriate time out taken.  Patient's left arm was prepped and draped in the usual sterile fashion. The ruler used to measure and mark insertion area.  Patient was prepped with alcohol swab and then injected with 3 ml of 1% lidocaine.  She was prepped with betadine, Nexplanon removed from packaging,  Device confirmed in needle, then inserted full length of needle and withdrawn per handbook instructions. Nexplanon was able to palpated in the patient's arm; patient palpated the insert herself. There was minimal blood loss.  Patient insertion site covered with guaze and a pressure bandage to reduce any bruising.  The patient tolerated the procedure well and was given post procedure instructions.    Verita Schneiders, MD, [place] for [employer], [place]

## 2018-12-26 ENCOUNTER — Other Ambulatory Visit: Payer: Self-pay

## 2018-12-26 ENCOUNTER — Other Ambulatory Visit (HOSPITAL_COMMUNITY): Payer: Self-pay | Admitting: Emergency Medicine

## 2018-12-26 ENCOUNTER — Ambulatory Visit (HOSPITAL_COMMUNITY)
Admission: EM | Admit: 2018-12-26 | Discharge: 2018-12-26 | Disposition: A | Discharge status: Home / Self Care | Payer: HRSA Program | Attending: Family Medicine | Admitting: Family Medicine

## 2018-12-26 ENCOUNTER — Encounter (HOSPITAL_COMMUNITY): Payer: Self-pay

## 2018-12-26 DIAGNOSIS — B9789 Other viral agents as the cause of diseases classified elsewhere: Secondary | ICD-10-CM | POA: Insufficient documentation

## 2018-12-26 DIAGNOSIS — J209 Acute bronchitis, unspecified: Secondary | ICD-10-CM

## 2018-12-26 DIAGNOSIS — Z20828 Contact with and (suspected) exposure to other viral communicable diseases: Secondary | ICD-10-CM

## 2018-12-26 DIAGNOSIS — R05 Cough: Secondary | ICD-10-CM | POA: Diagnosis not present

## 2018-12-26 DIAGNOSIS — J069 Acute upper respiratory infection, unspecified: Secondary | ICD-10-CM

## 2018-12-26 DIAGNOSIS — R062 Wheezing: Secondary | ICD-10-CM

## 2018-12-26 MED ORDER — FLUTICASONE PROPIONATE 50 MCG/ACT NA SUSP: 1.0000 | Freq: Every day | NASAL | 0 refills | Status: DC

## 2018-12-26 MED ORDER — PREDNISONE 50 MG PO TABS: 50.0000 mg | ORAL_TABLET | Freq: Every day | ORAL | 0 refills | Status: DC

## 2018-12-26 MED ORDER — CETIRIZINE HCL 10 MG PO CAPS: 10.0000 mg | ORAL_CAPSULE | Freq: Every day | ORAL | 0 refills | Status: DC

## 2018-12-26 NOTE — ED Triage Notes (Signed)
Patient presents to Urgent Care with complaints of runny nose and low grade fever since yesterday. Patient reports she needs a note for work and they are wanting her to be COVID tested.

## 2018-12-26 NOTE — Discharge Instructions (Signed)
Begin daily cetirizine to help with congestion, drainage, sneezing Please also use Flonase nasal spray to further help with sinus pressure, congestion Prednisone daily with food for 5 days to help with inflammation in chest and sinuses Continue to use albuterol inhaler as needed for shortness of breath, wheezing, chest tightness  Rest and drink plenty of fluids  We will call if COVID test positive, otherwise monitor my chart  Follow-up if symptoms persisting into next weekend, worsening, developing fevers, shortness of breath, chest discomfort

## 2018-12-26 NOTE — ED Provider Notes (Signed)
[facility]    CSN: [id number] Arrival date & time: 12/26/18  1332      History   Chief Complaint Chief Complaint  Patient presents with  . Allergies  . COVID Test    HPI Diane Stokes is a 28 y.o. female history of tobacco use, presenting today for evaluation of rhinorrhea, congestion.  Patient states that over the past 3 to 4 days she has had congestion and rhinorrhea.  She has also had some mild sinus pressure and notes that her mucus has been multiple colors been thick.  She has noted fevers up to 102, has not taken any Tylenol or ibuprofen prior to arrival today.  Her work is requiring COVID testing before returning.  She has had an occasional cough.  Denies chest pain or shortness of breath.  Does feel at nighttime when she lies flat is more difficult to breathe.  Denies any known exposure to COVID.  Denies sore throat.  HPI  Past Medical History:  Diagnosis Date  . HPV (human papilloma virus) infection   . Seasonal allergies     Patient Active Problem List   Diagnosis Date Noted  . Palpitations 04/22/2016  . Smoker 04/22/2016  . SOB (shortness of breath) on exertion 04/22/2016  . Class 3 obesity due to excess calories with body mass index (BMI) of 40.0 to 44.9 in adult 12/27/2013  . Morbid obesity (HCC) 05/19/2013  . Abnormal Pap smear of cervix 05/19/2013    Past Surgical History:  Procedure Laterality Date  . FOOT SURGERY      OB History    Gravida  0   Para      Term      Preterm      AB      Living  0     SAB      TAB      Ectopic      Multiple      Live Births               Home Medications    Prior to Admission medications   Medication Sig Start Date End Date Taking? Authorizing Provider  buPROPion (WELLBUTRIN SR) 100 MG 12 hr tablet Take 100 mg by mouth every morning. 08/14/18   [provider]  Cetirizine HCl 10 MG CAPS Take 1 capsule (10 mg total) by mouth daily. 12/26/18   Marq Rebello C, PA-C   fluticasone (FLONASE) 50 MCG/ACT nasal spray Place 1-2 sprays into both nostrils daily for 7 days. 12/26/18 01/02/19  Ayahna Solazzo C, PA-C  hydrOXYzine (VISTARIL) 25 MG capsule TAKE 1 CAPSULE BY MOUTH EVERY 6 TO 8 HOURS AS NEEDED FOR ANXIETY AND FOR ITCHING 08/14/18   [provider]  Multiple Vitamin (MULTIVITAMIN) tablet Take 1 tablet by mouth every morning.     [provider]  predniSONE (DELTASONE) 50 MG tablet Take 1 tablet (50 mg total) by mouth daily for 5 days. 12/26/18 12/31/18  Omarii Scalzo, Junius Creamer, PA-C    Family History Family History  Problem Relation Age of Onset  . Hyperlipidemia Mother   . Hypertension Mother   . Diabetes Maternal Grandfather   . Hypertension Maternal Grandfather     Social History Social History   Tobacco Use  . Smoking status: Current Every Day Smoker    Packs/day: 0.25  . Smokeless tobacco: Never Used  Substance Use Topics  . Alcohol use: Yes    Alcohol/week: 1.0 standard drinks    Types: 1  Cans of beer per week    Comment: occasional  . Drug use: No     Allergies   Doxycycline, Klonopin [clonazepam], Minocycline, and Diflucan [fluconazole]   Review of Systems Review of Systems  Constitutional: Positive for fever. Negative for activity change, appetite change, chills and fatigue.  HENT: Positive for congestion, rhinorrhea, sinus pressure and sneezing. Negative for ear pain, sore throat and trouble swallowing.   Eyes: Negative for discharge and redness.  Respiratory: Positive for cough. Negative for chest tightness and shortness of breath.   Cardiovascular: Negative for chest pain.  Gastrointestinal: Negative for abdominal pain, diarrhea, nausea and vomiting.  Musculoskeletal: Negative for myalgias.  Skin: Negative for rash.  Neurological: Negative for dizziness, light-headedness and headaches.     Physical Exam Triage Vital Signs ED Triage Vitals  Enc Vitals Group     BP 12/26/18 1436 135/78     Pulse Rate 12/26/18  1436 93     Resp 12/26/18 1436 15     Temp 12/26/18 1436 98.6 F (37 C)     Temp Source 12/26/18 1436 Oral     SpO2 12/26/18 1436 98 %     Weight --      Height --      Head Circumference --      Peak Flow --      Pain Score 12/26/18 1432 0     Pain Loc --      Pain Edu? --      Excl. in GC? --    No data found.  Updated Vital Signs BP 135/78 (BP Location: Left Arm)   Pulse 93   Temp 98.6 F (37 C) (Oral)   Resp 15   SpO2 98%   Visual Acuity Right Eye Distance:   Left Eye Distance:   Bilateral Distance:    Right Eye Near:   Left Eye Near:    Bilateral Near:     Physical Exam Vitals signs and nursing note reviewed.  Constitutional:      General: She is not in acute distress.    Appearance: She is well-developed.  HENT:     Head: Normocephalic and atraumatic.     Ears:     Comments: Bilateral ears without tenderness to palpation of external auricle, tragus and mastoid, EAC's without erythema or swelling, TM's with good bony landmarks and cone of light. Non erythematous.     Mouth/Throat:     Comments: Oral mucosa pink and moist, no tonsillar enlargement or exudate. Posterior pharynx patent and nonerythematous, no uvula deviation or swelling. Normal phonation. Eyes:     Conjunctiva/sclera: Conjunctivae normal.  Neck:     Musculoskeletal: Neck supple.  Cardiovascular:     Rate and Rhythm: Normal rate and regular rhythm.     Heart sounds: No murmur.  Pulmonary:     Effort: Pulmonary effort is normal. No respiratory distress.     Breath sounds: Wheezing present.     Comments: Expiratory wheezing throughout bilateral lung fields, no coughing during exam, breathing comfortably at rest Abdominal:     Palpations: Abdomen is soft.     Tenderness: There is no abdominal tenderness.  Skin:    General: Skin is warm and dry.  Neurological:     Mental Status: She is alert.      UC Treatments / Results  Labs (all labs ordered are listed, but only abnormal results  are displayed) Labs Reviewed  NOVEL CORONAVIRUS, NAA (HOSP ORDER, SEND-OUT TO REF LAB; TAT 18-24 HRS)  EKG   Radiology No results found.  Procedures Procedures (including critical care time)  Medications Ordered in UC Medications - No data to display  Initial Impression / Assessment and Plan / UC Course  I have reviewed the triage vital signs and the nursing notes.  Pertinent labs & imaging results that were available during my care of the patient were reviewed by me and considered in my medical decision making (see chart for details).     COVID test pending.  Symptoms most likely viral, likely early bronchitis as well given wheezing.  Recommending symptomatic and supportive care, Zyrtec and Flonase for congestion and sinuses, continue albuterol inhaler which she has already prescribed to her.  Initiating 5-day course of prednisone to help with inflammation in chest as well as sinuses.  Vital signs stable, continue to monitor,Discussed strict return precautions. Patient verbalized understanding and is agreeable with plan.  Final Clinical Impressions(s) / UC Diagnoses   Final diagnoses:  Viral URI with cough  Acute bronchitis, unspecified organism     Discharge Instructions     Begin daily cetirizine to help with congestion, drainage, sneezing Please also use Flonase nasal spray to further help with sinus pressure, congestion Prednisone daily with food for 5 days to help with inflammation in chest and sinuses Continue to use albuterol inhaler as needed for shortness of breath, wheezing, chest tightness  Rest and drink plenty of fluids  We will call if COVID test positive, otherwise monitor my chart  Follow-up if symptoms persisting into next weekend, worsening, developing fevers, shortness of breath, chest discomfort   ED Prescriptions    Medication Sig Dispense Auth. Provider   predniSONE (DELTASONE) 50 MG tablet Take 1 tablet (50 mg total) by mouth daily for 5  days. 5 tablet Rafal Archuleta C, PA-C   fluticasone (FLONASE) 50 MCG/ACT nasal spray Place 1-2 sprays into both nostrils daily for 7 days. 1 g Lyris Hitchman C, PA-C   Cetirizine HCl 10 MG CAPS Take 1 capsule (10 mg total) by mouth daily. 15 capsule Anahis Furgeson C, PA-C     Controlled Substance Prescriptions [place] Controlled Substance Registry consulted? Not Applicable   Janith Lima, [place] 12/26/18 1457

## 2018-12-27 LAB — NOVEL CORONAVIRUS, NAA (HOSP ORDER, SEND-OUT TO REF LAB; TAT 18-24 HRS): SARS-CoV-2, NAA: NOT DETECTED

## 2018-12-29 ENCOUNTER — Encounter (HOSPITAL_COMMUNITY): Payer: Self-pay

## 2018-12-30 ENCOUNTER — Telehealth: Admitting: Physician Assistant

## 2018-12-30 DIAGNOSIS — J0110 Acute frontal sinusitis, unspecified: Secondary | ICD-10-CM | POA: Diagnosis not present

## 2018-12-30 MED ORDER — PREDNISONE 10 MG PO TABS: 40.0000 mg | ORAL_TABLET | Freq: Every day | ORAL | 0 refills | Status: AC

## 2018-12-30 NOTE — Progress Notes (Signed)
We are sorry that you are not feeling well.  Here is how we plan to help!  Based on what you have shared with me it looks like you have sinusitis.  Sinusitis is inflammation and infection in the sinus cavities of the head.  Based on your presentation I believe you most likely have Acute Viral Sinusitis.This is an infection most likely caused by a virus. There is not specific treatment for viral sinusitis other than to help you with the symptoms until the infection runs its course.  You may use an oral decongestant such as Mucinex D or if you have glaucoma or high blood pressure use plain Mucinex. Saline nasal spray help and can safely be used as often as needed for congestion, I have prescribed: Prednisone  Some authorities believe that zinc sprays or the use of Echinacea may shorten the course of your symptoms.  Sinus infections are not as easily transmitted as other respiratory infection, however we still recommend that you avoid close contact with loved ones, especially the very young and elderly.  Remember to wash your hands thoroughly throughout the day as this is the number one way to prevent the spread of infection!  Home Care:  Only take medications as instructed by your medical team.  Do not take these medications with alcohol.  A steam or ultrasonic humidifier can help congestion.  You can place a towel over your head and breathe in the steam from hot water coming from a faucet.  Avoid close contacts especially the very young and the elderly.  Cover your mouth when you cough or sneeze.  Always remember to wash your hands.  Get Help Right Away If:  You develop worsening fever or sinus pain.  You develop a severe head ache or visual changes.  Your symptoms persist after you have completed your treatment plan.  Make sure you  Understand these instructions.  Will watch your condition.  Will get help right away if you are not doing well or get worse.  Your e-visit answers  were reviewed by a board certified advanced clinical practitioner to complete your personal care plan.  Depending on the condition, your plan could have included both over the counter or prescription medications.  If there is a problem please reply  once you have received a response from your provider.  Your safety is important to [place].  If you have drug allergies check your prescription carefully.    You can use MyChart to ask questions about today's visit, request a non-urgent call back, or ask for a work or school excuse for 24 hours related to this e-Visit. If it has been greater than 24 hours you will need to follow up with your provider, or enter a new e-Visit to address those concerns.  You will get an e-mail in the next two days asking about your experience.  I hope that your e-visit has been valuable and will speed your recovery. Thank you for using e-visits.   I spent about 5 minutes on this chart

## 2019-02-28 ENCOUNTER — Ambulatory Visit: Admission: EM | Admit: 2019-02-28 | Discharge: 2019-02-28 | Disposition: A | Discharge status: Home / Self Care

## 2019-02-28 ENCOUNTER — Ambulatory Visit (INDEPENDENT_AMBULATORY_CARE_PROVIDER_SITE_OTHER)
Admission: RE | Admit: 2019-02-28 | Discharge: 2019-02-28 | Disposition: A | Discharge status: Home / Self Care | Source: Ambulatory Visit

## 2019-02-28 ENCOUNTER — Other Ambulatory Visit: Payer: Self-pay

## 2019-02-28 DIAGNOSIS — M542 Cervicalgia: Secondary | ICD-10-CM | POA: Diagnosis not present

## 2019-02-28 DIAGNOSIS — R590 Localized enlarged lymph nodes: Secondary | ICD-10-CM

## 2019-02-28 DIAGNOSIS — H6691 Otitis media, unspecified, right ear: Secondary | ICD-10-CM

## 2019-02-28 DIAGNOSIS — R221 Localized swelling, mass and lump, neck: Secondary | ICD-10-CM

## 2019-02-28 MED ORDER — IBUPROFEN 800 MG PO TABS: 800.0000 mg | ORAL_TABLET | Freq: Three times a day (TID) | ORAL | 0 refills | Status: DC | PRN

## 2019-02-28 MED ORDER — AMOXICILLIN 500 MG PO TABS: 500.0000 mg | ORAL_TABLET | Freq: Three times a day (TID) | ORAL | 0 refills | Status: DC

## 2019-02-28 NOTE — ED Provider Notes (Signed)
Virtual Visit via Video Note:  Diane Stokes  initiated request for Telemedicine visit with [facility] team. I connected with Diane Stokes  on 02/28/2019 at 12:48 PM  for a synchronized telemedicine visit using a video enabled HIPPA compliant telemedicine application. I verified that I am speaking with Diane Stokes  using two identifiers. Sharion Balloon, NP  was physically located in a [facility] site and DEVINE DANT was located at a different location.   The limitations of evaluation and management by telemedicine as well as the availability of in-person appointments were discussed. Patient was informed that she  may incur a bill ( including co-pay) for this virtual visit encounter. Geoffrey E Altemose  expressed understanding and gave verbal consent to proceed with virtual visit.     History of Present Illness:Diane Stokes  is a 28 y.o. female presents for evaluation of 2 day history of "knot" in the right side of her neck.  She states she had a tooth abscess last week but it was on the left side and "ruptured" on its own.  She denies recent use of antibiotics.  She denies fever, chills, earache, sore throat, lesions, rash, cough, or other symptoms.  No injury.  No neck pain or stiffness.   Allergies  Allergen Reactions  . Doxycycline   . Klonopin [Clonazepam]   . Minocycline Hives  . Diflucan [Fluconazole] Rash     Past Medical History:  Diagnosis Date  . HPV (human papilloma virus) infection   . Seasonal allergies      Social History   Tobacco Use  . Smoking status: Current Every Day Smoker    Packs/day: 0.25  . Smokeless tobacco: Never Used  Substance Use Topics  . Alcohol use: Yes    Alcohol/week: 1.0 standard drinks    Types: 1 Cans of beer per week    Comment: occasional  . Drug use: No        Observations/Objective: Physical Exam  VITALS: Patient denies fever. GENERAL: Alert, appears well and in no acute distress. HEENT: Atraumatic.  Patient indicates "knot" in the right posterior but unable to view on video. NECK: Normal movements of the head and neck. CARDIOPULMONARY: No increased WOB. Speaking in clear sentences. I:E ratio WNL.  MS: Moves all visible extremities without noticeable abnormality. PSYCH: Pleasant and cooperative, well-groomed. Speech normal rate and rhythm. Affect is appropriate. Insight and judgement are appropriate. Attention is focused, linear, and appropriate.  NEURO: CN grossly intact. Oriented as arrived to appointment on time with no prompting. Moves both UE equally.     Assessment and Plan:    ICD-10-CM   1. Mass of neck  R22.1        Follow Up Instructions: Advised patient to be seen in person either here or by her primary care provider.  Patient agrees to plan of care.    I discussed the assessment and treatment plan with the patient. The patient was provided an opportunity to ask questions and all were answered. The patient agreed with the plan and demonstrated an understanding of the instructions.   The patient was advised to call back or seek an in-person evaluation if the symptoms worsen or if the condition fails to improve as anticipated.      Sharion Balloon, NP  02/28/2019 12:48 PM         Sharion Balloon, NP 02/28/19 1248

## 2019-02-28 NOTE — Discharge Instructions (Signed)
Take the antibiotic amoxicillin as directed.  Take the ibuprofen as needed for discomfort.    Follow-up with your primary care provider or return here if your symptoms are not improving or get worse.    Go to the emergency department if you have difficulty swallowing or breathing.

## 2019-02-28 NOTE — ED Provider Notes (Signed)
[facility]    CSN: [id number] Arrival date & time: 02/28/19  1601      History   Chief Complaint Chief Complaint  Patient presents with  . Cyst    HPI Diane Stokes is a 28 y.o. female.   Patient presents with a painful "lump" on the right side of her neck x2 days.  She denies fever, chills, earache, sore throat, difficulty swallowing, cough, shortness of breath, vomiting, diarrhea, rash, or other symptoms.  Patient states she has taken Tylenol at home without relief.  The history is provided by the patient.    Past Medical History:  Diagnosis Date  . HPV (human papilloma virus) infection   . Seasonal allergies     Patient Active Problem List   Diagnosis Date Noted  . Palpitations 04/22/2016  . Smoker 04/22/2016  . SOB (shortness of breath) on exertion 04/22/2016  . Class 3 obesity due to excess calories with body mass index (BMI) of 40.0 to 44.9 in adult 12/27/2013  . Morbid obesity (HCC) 05/19/2013  . Abnormal Pap smear of cervix 05/19/2013    Past Surgical History:  Procedure Laterality Date  . FOOT SURGERY      OB History    Gravida  0   Para      Term      Preterm      AB      Living  0     SAB      TAB      Ectopic      Multiple      Live Births               Home Medications    Prior to Admission medications   Medication Sig Start Date End Date Taking? Authorizing Provider  metoprolol tartrate (LOPRESSOR) 25 MG tablet Take by mouth. 04/30/16  Yes [provider]  amoxicillin (AMOXIL) 500 MG tablet Take 1 tablet (500 mg total) by mouth 3 (three) times daily. 02/28/19   Mickie Bail, NP  buPROPion (WELLBUTRIN SR) 100 MG 12 hr tablet Take 100 mg by mouth every morning. 08/14/18   [provider]  Cetirizine HCl 10 MG CAPS Take 1 capsule (10 mg total) by mouth daily. 12/26/18   Wieters, Hallie C, PA-C  fluticasone (FLONASE) 50 MCG/ACT nasal spray Place 1-2 sprays into both nostrils daily for 7 days. 12/26/18  01/02/19  Wieters, Hallie C, PA-C  hydrOXYzine (VISTARIL) 25 MG capsule TAKE 1 CAPSULE BY MOUTH EVERY 6 TO 8 HOURS AS NEEDED FOR ANXIETY AND FOR ITCHING 08/14/18   [provider]  ibuprofen (ADVIL) 800 MG tablet Take 1 tablet (800 mg total) by mouth every 8 (eight) hours as needed. 02/28/19   Mickie Bail, NP  Multiple Vitamin (MULTIVITAMIN) tablet Take 1 tablet by mouth every morning.     [provider]    Family History Family History  Problem Relation Age of Onset  . Hyperlipidemia Mother   . Hypertension Mother   . Diabetes Maternal Grandfather   . Hypertension Maternal Grandfather     Social History Social History   Tobacco Use  . Smoking status: Current Every Day Smoker    Packs/day: 0.25  . Smokeless tobacco: Never Used  Substance Use Topics  . Alcohol use: Yes    Alcohol/week: 1.0 standard drinks    Types: 1 Cans of beer per week    Comment: occasional  . Drug use: No     Allergies  Doxycycline, Klonopin [clonazepam], Minocycline, and Diflucan [fluconazole]   Review of Systems Review of Systems  Constitutional: Negative for chills and fever.  HENT: Negative for congestion, ear pain, rhinorrhea, sore throat and trouble swallowing.   Eyes: Negative for pain and visual disturbance.  Respiratory: Negative for cough and shortness of breath.   Cardiovascular: Negative for chest pain and palpitations.  Gastrointestinal: Negative for abdominal pain, diarrhea and vomiting.  Genitourinary: Negative for dysuria and hematuria.  Musculoskeletal: Negative for arthralgias and back pain.  Skin: Negative for color change and rash.  Neurological: Negative for seizures and syncope.  All other systems reviewed and are negative.    Physical Exam Triage Vital Signs ED Triage Vitals  Enc Vitals Group     BP 02/28/19 1608 111/75     Pulse Rate 02/28/19 1608 (!) 113     Resp 02/28/19 1608 16     Temp 02/28/19 1608 98.3 F (36.8 C)     Temp Source 02/28/19  1608 Oral     SpO2 02/28/19 1608 95 %     Weight --      Height --      Head Circumference --      Peak Flow --      Pain Score 02/28/19 1605 9     Pain Loc --      Pain Edu? --      Excl. in GC? --    No data found.  Updated Vital Signs BP 111/75 (BP Location: Right Arm)   Pulse (!) 113   Temp 98.3 F (36.8 C) (Oral)   Resp 16   SpO2 95%   Visual Acuity Right Eye Distance:   Left Eye Distance:   Bilateral Distance:    Right Eye Near:   Left Eye Near:    Bilateral Near:     Physical Exam Vitals signs and nursing note reviewed.  Constitutional:      General: She is not in acute distress.    Appearance: She is well-developed. She is not ill-appearing.  HENT:     Head: Normocephalic and atraumatic.     Right Ear: Ear canal normal. Tympanic membrane is erythematous.     Left Ear: Tympanic membrane and ear canal normal. Tympanic membrane is not erythematous.     Nose: Nose normal.     Mouth/Throat:     Mouth: Mucous membranes are moist.     Pharynx: Oropharynx is clear.  Eyes:     Conjunctiva/sclera: Conjunctivae normal.  Neck:     Musculoskeletal: Neck supple.  Cardiovascular:     Rate and Rhythm: Normal rate and regular rhythm.     Heart sounds: No murmur.  Pulmonary:     Effort: Pulmonary effort is normal. No respiratory distress.     Breath sounds: Normal breath sounds.  Abdominal:     General: Bowel sounds are normal.     Palpations: Abdomen is soft.     Tenderness: There is no abdominal tenderness. There is no guarding or rebound.  Lymphadenopathy:     Cervical: Cervical adenopathy present.  Skin:    General: Skin is warm and dry.     Findings: No rash.  Neurological:     General: No focal deficit present.     Mental Status: She is alert and oriented to person, place, and time.      UC Treatments / Results  Labs (all labs ordered are listed, but only abnormal results are displayed) Labs Reviewed - No data to display  EKG   Radiology No  results found.  Procedures Procedures (including critical care time)  Medications Ordered in UC Medications - No data to display  Initial Impression / Assessment and Plan / UC Course  I have reviewed the triage vital signs and the nursing notes.  Pertinent labs & imaging results that were available during my care of the patient were reviewed by me and considered in my medical decision making (see chart for details).   Right otitis media, lymphadenopathy.  Treating with amoxicillin and ibuprofen.  Instructed patient to return here or follow-up with her PCP if her symptoms are not improving or get worse.  Instructed her to go to the emergency department if she has difficulty swallowing or breathing.  Patient agrees to plan of care.     Final Clinical Impressions(s) / UC Diagnoses   Final diagnoses:  Right otitis media, unspecified otitis media type  Lymphadenopathy of right cervical region     Discharge Instructions     Take the antibiotic amoxicillin as directed.  Take the ibuprofen as needed for discomfort.    Follow-up with your primary care provider or return here if your symptoms are not improving or get worse.    Go to the emergency department if you have difficulty swallowing or breathing.        ED Prescriptions    Medication Sig Dispense Auth. Provider   amoxicillin (AMOXIL) 500 MG tablet Take 1 tablet (500 mg total) by mouth 3 (three) times daily. 21 tablet Barkley Boards H, NP   ibuprofen (ADVIL) 800 MG tablet Take 1 tablet (800 mg total) by mouth every 8 (eight) hours as needed. 21 tablet Sharion Balloon, NP     PDMP not reviewed this encounter.   Sharion Balloon, NP 02/28/19 [phone]

## 2019-02-28 NOTE — Discharge Instructions (Addendum)
Please come to be seen in person at one of the Urgent Care sites or follow-up with your primary care provider to have your symptom evaluated.

## 2019-02-28 NOTE — ED Triage Notes (Signed)
Pt states having a lump in the right side of her neck x 2 days. Pt states the lump in her neck is painful all the time.

## 2019-03-07 ENCOUNTER — Telehealth: Payer: Self-pay | Admitting: Emergency Medicine

## 2019-03-07 MED ORDER — AMOXICILLIN 500 MG PO TABS: 500.0000 mg | ORAL_TABLET | Freq: Three times a day (TID) | ORAL | 0 refills | Status: DC

## 2019-03-07 NOTE — Telephone Encounter (Signed)
Patient called this urgent care to request advice, following up a in person visit done on 02/28/2019.  Patient states her symptoms are improving but she is on the last day of her antibiotic.  She states she would like an extension of her antibiotic for a few days so that her symptoms can continue to improve.  She also request a stronger pain medication.  Discussed with patient that I will extend her course of amoxicillin but will not give her a stronger pain medication.  Instructed her to continue to take ibuprofen.  Instructed patient to follow-up with her PCP or return here if her symptoms have not resolved at the end of her antibiotic course.  Patient agrees to plan of care.

## 2019-04-18 ENCOUNTER — Ambulatory Visit
Admission: EM | Admit: 2019-04-18 | Discharge: 2019-04-18 | Disposition: A | Discharge status: Home / Self Care | Attending: Emergency Medicine | Admitting: Emergency Medicine

## 2019-04-18 ENCOUNTER — Other Ambulatory Visit: Payer: Self-pay

## 2019-04-18 ENCOUNTER — Encounter: Payer: Self-pay | Admitting: Emergency Medicine

## 2019-04-18 DIAGNOSIS — M5442 Lumbago with sciatica, left side: Secondary | ICD-10-CM

## 2019-04-18 MED ORDER — CYCLOBENZAPRINE HCL 10 MG PO TABS: 10.0000 mg | ORAL_TABLET | Freq: Two times a day (BID) | ORAL | 0 refills | Status: DC | PRN

## 2019-04-18 MED ORDER — IBUPROFEN 800 MG PO TABS: 800.0000 mg | ORAL_TABLET | Freq: Three times a day (TID) | ORAL | 0 refills | Status: DC | PRN

## 2019-04-18 NOTE — ED Triage Notes (Signed)
Patient in office today c/o lower back pain x 2wks . Denies hurting her self pulling or lifiting states that she woke up that way  OTC:[facility]

## 2019-04-18 NOTE — Discharge Instructions (Addendum)
Take the prescribed ibuprofen as needed for your pain.  Take the muscle relaxer Flexeril as needed for muscle spasm; do not drive, operate machinery, or drink alcohol with this medication as it may make you drowsy.    Follow up with the orthopedist if your pain is not improving.  Go to the emergency department if you have worsening pain or develop new symptoms such as difficulty with urination, weakness, numbness, loss of control of your bladder or bowels, fever, or chills.  Your blood pressure is elevated today at 142/98.  Please have this rechecked by your primary care provider in 2 weeks.

## 2019-04-18 NOTE — ED Provider Notes (Signed)
[facility]    CSN: [id number] Arrival date & time: 04/18/19  1254      History   Chief Complaint Chief Complaint  Patient presents with  . Back Pain    HPI Diane Stokes is a 28 y.o. female.   Patient presents with left lower back pain x2 weeks.  She denies falls or injury.  She states the pain radiates down to her left posterior thigh, rates 10/10, worse with movement and walking, improves with rest.  She denies numbness or weakness in her LE.  She denies bowel/bladder incontinence.  She has attempted treatment at home with OTC ibuprofen.  The history is provided by the patient.    Past Medical History:  Diagnosis Date  . HPV (human papilloma virus) infection   . Seasonal allergies     Patient Active Problem List   Diagnosis Date Noted  . Palpitations 04/22/2016  . Smoker 04/22/2016  . SOB (shortness of breath) on exertion 04/22/2016  . Class 3 obesity due to excess calories with body mass index (BMI) of 40.0 to 44.9 in adult 12/27/2013  . Morbid obesity (HCC) 05/19/2013  . Abnormal Pap smear of cervix 05/19/2013    Past Surgical History:  Procedure Laterality Date  . FOOT SURGERY      OB History    Gravida  0   Para      Term      Preterm      AB      Living  0     SAB      TAB      Ectopic      Multiple      Live Births               Home Medications    Prior to Admission medications   Medication Sig Start Date End Date Taking? Authorizing Provider  amoxicillin (AMOXIL) 500 MG tablet Take 1 tablet (500 mg total) by mouth 3 (three) times daily. 02/28/19   Mickie Bailate, Liev Brockbank H, NP  amoxicillin (AMOXIL) 500 MG tablet Take 1 tablet (500 mg total) by mouth 3 (three) times daily. 03/07/19   Mickie Bailate, Shields Pautz H, NP  buPROPion [facility](WELLBUTRIN SR) 100 MG 12 hr tablet Take 100 mg by mouth every morning. 08/14/18   [provider]  Cetirizine HCl 10 MG CAPS Take 1 capsule (10 mg total) by mouth daily. 12/26/18   Wieters, Hallie C, PA-C    cyclobenzaprine (FLEXERIL) 10 MG tablet Take 1 tablet (10 mg total) by mouth 2 (two) times daily as needed for muscle spasms. 04/18/19   Mickie Bailate, Lei Dower H, NP  fluticasone (FLONASE) 50 MCG/ACT nasal spray Place 1-2 sprays into both nostrils daily for 7 days. 12/26/18 01/02/19  Wieters, Hallie C, PA-C  hydrOXYzine (VISTARIL) 25 MG capsule TAKE 1 CAPSULE BY MOUTH EVERY 6 TO 8 HOURS AS NEEDED FOR ANXIETY AND FOR ITCHING 08/14/18   [provider]  ibuprofen (ADVIL) 800 MG tablet Take 1 tablet (800 mg total) by mouth every 8 (eight) hours as needed. 04/18/19   Mickie Bailate, Giovan Pinsky H, NP  metoprolol tartrate (LOPRESSOR) 25 MG tablet Take by mouth. 04/30/16   [provider]  Multiple Vitamin (MULTIVITAMIN) tablet Take 1 tablet by mouth every morning.     [provider]    Family History Family History  Problem Relation Age of Onset  . Hyperlipidemia Mother   . Hypertension Mother   . Diabetes Maternal Grandfather   . Hypertension Maternal Grandfather  Social History Social History   Tobacco Use  . Smoking status: Current Every Day Smoker    Packs/day: 0.25  . Smokeless tobacco: Never Used  Substance Use Topics  . Alcohol use: Yes    Alcohol/week: 1.0 standard drinks    Types: 1 Cans of beer per week    Comment: occasional  . Drug use: No     Allergies   Doxycycline, Klonopin [clonazepam], Minocycline, and Diflucan [fluconazole]   Review of Systems Review of Systems  Constitutional: Negative for chills and fever.  HENT: Negative for ear pain and sore throat.   Eyes: Negative for pain and visual disturbance.  Respiratory: Negative for cough and shortness of breath.   Cardiovascular: Negative for chest pain and palpitations.  Gastrointestinal: Negative for abdominal pain, diarrhea, nausea and vomiting.  Genitourinary: Negative for dysuria and hematuria.  Musculoskeletal: Positive for back pain. Negative for arthralgias.  Skin: Negative for color change and rash.   Neurological: Negative for seizures and syncope.  All other systems reviewed and are negative.    Physical Exam Triage Vital Signs ED Triage Vitals  Enc Vitals Group     BP 04/18/19 1255 (!) 142/98     Pulse Rate 04/18/19 1255 (!) 102     Resp 04/18/19 1255 18     Temp 04/18/19 1255 98.1 F (36.7 C)     Temp Source 04/18/19 1255 Oral     SpO2 04/18/19 1255 98 %     Weight --      Height --      Head Circumference --      Peak Flow --      Pain Score 04/18/19 1308 10     Pain Loc --      Pain Edu? --      Excl. in GC? --    No data found.  Updated Vital Signs BP (!) 142/98 (BP Location: Left Arm)   Pulse (!) 102   Temp 98.1 F (36.7 C) (Oral)   Resp 18   Wt 233 lb (105.7 kg)   SpO2 98%   BMI 37.61 kg/m   Visual Acuity Right Eye Distance:   Left Eye Distance:   Bilateral Distance:    Right Eye Near:   Left Eye Near:    Bilateral Near:     Physical Exam Vitals and nursing note reviewed.  Constitutional:      General: She is not in acute distress.    Appearance: She is well-developed. She is not ill-appearing.  HENT:     Head: Normocephalic and atraumatic.     Mouth/Throat:     Mouth: Mucous membranes are moist.     Pharynx: Oropharynx is clear.  Eyes:     Conjunctiva/sclera: Conjunctivae normal.  Cardiovascular:     Rate and Rhythm: Normal rate and regular rhythm.     Heart sounds: No murmur.  Pulmonary:     Effort: Pulmonary effort is normal. No respiratory distress.     Breath sounds: Normal breath sounds.  Abdominal:     General: Bowel sounds are normal.     Palpations: Abdomen is soft.     Tenderness: There is no abdominal tenderness. There is no right CVA tenderness, left CVA tenderness, guarding or rebound.  Musculoskeletal:        General: No swelling, tenderness, deformity or signs of injury. Normal range of motion.     Cervical back: Neck supple.  Skin:    General: Skin is warm and dry.  Capillary Refill: Capillary refill takes less  than 2 seconds.     Findings: No bruising, erythema, lesion or rash.  Neurological:     General: No focal deficit present.     Mental Status: She is alert and oriented to person, place, and time.     Sensory: No sensory deficit.     Motor: No weakness.     Gait: Gait normal.  Psychiatric:        Mood and Affect: Mood normal.        Behavior: Behavior normal.      UC Treatments / Results  Labs (all labs ordered are listed, but only abnormal results are displayed) Labs Reviewed - No data to display  EKG   Radiology No results found.  Procedures Procedures (including critical care time)  Medications Ordered in UC Medications - No data to display  Initial Impression / Assessment and Plan / UC Course  I have reviewed the triage vital signs and the nursing notes.  Pertinent labs & imaging results that were available during my care of the patient were reviewed by me and considered in my medical decision making (see chart for details).    Acute left lower back pain with left-sided sciatica.  Treating with ibuprofen and Flexeril.  Precautions drowsiness with Flexeril discussed with patient.  Instructed patient to follow-up with orthopedics if her pain is not improving.  Discussed that she should go to the emergency department if she has increasing pain or new symptoms such as dysuria, weakness, numbness, bowel/bladder incontinence, fever, chills, or other concerns.  Discussed with patient that her blood pressure is elevated today needs to be rechecked by her PCP in 2 to 4 weeks.  Patient agrees to this plan of care.     Final Clinical Impressions(s) / UC Diagnoses   Final diagnoses:  Acute left-sided low back pain with left-sided sciatica     Discharge Instructions     Take the prescribed ibuprofen as needed for your pain.  Take the muscle relaxer Flexeril as needed for muscle spasm; do not drive, operate machinery, or drink alcohol with this medication as it may make you  drowsy.    Follow up with the orthopedist if your pain is not improving.  Go to the emergency department if you have worsening pain or develop new symptoms such as difficulty with urination, weakness, numbness, loss of control of your bladder or bowels, fever, or chills.  Your blood pressure is elevated today at 142/98.  Please have this rechecked by your primary care provider in 2 weeks.        ED Prescriptions    Medication Sig Dispense Auth. Provider   ibuprofen (ADVIL) 800 MG tablet Take 1 tablet (800 mg total) by mouth every 8 (eight) hours as needed. 21 tablet Sharion Balloon, NP   cyclobenzaprine (FLEXERIL) 10 MG tablet Take 1 tablet (10 mg total) by mouth 2 (two) times daily as needed for muscle spasms. 20 tablet Sharion Balloon, NP     I have reviewed the PDMP during this encounter.   Sharion Balloon, NP 04/18/19 1330

## 2019-05-16 ENCOUNTER — Ambulatory Visit (INDEPENDENT_AMBULATORY_CARE_PROVIDER_SITE_OTHER)
Admission: RE | Admit: 2019-05-16 | Discharge: 2019-05-16 | Disposition: A | Discharge status: Home / Self Care | Payer: Self-pay | Source: Ambulatory Visit

## 2019-05-16 DIAGNOSIS — J069 Acute upper respiratory infection, unspecified: Secondary | ICD-10-CM

## 2019-05-16 NOTE — ED Provider Notes (Signed)
Virtual Visit via Video Note:  Diane Stokes  initiated request for Telemedicine visit with [facility] team. I connected with Diane Stokes  on 05/16/2019 at 3:59 PM  for a synchronized telemedicine visit using a video enabled HIPPA compliant telemedicine application. I verified that I am speaking with Diane Stokes  using two identifiers. Mickie Bail, NP  was physically located in a [facility] Urgent care site and AVELINA MCCLURKIN was located at a different location.   The limitations of evaluation and management by telemedicine as well as the availability of in-person appointments were discussed. Patient was informed that she  may incur a bill ( including co-pay) for this virtual visit encounter. Diane Stokes  expressed understanding and gave verbal consent to proceed with virtual visit.     History of Present Illness:Diane Stokes  is a 29 y.o. female presents for evaluation of sinus congestion, post nasal drip, green mucous since this morning.  She denies fever, chills, sore throat, cough, shortness of breath, vomiting, diarrhea, rash, or other symptoms.  Treating symptoms at home with Mucinex and Sudafed.       Allergies  Allergen Reactions  . Doxycycline   . Klonopin [Clonazepam]   . Minocycline Hives  . Diflucan [Fluconazole] Rash     Past Medical History:  Diagnosis Date  . HPV (human papilloma virus) infection   . Seasonal allergies      Social History   Tobacco Use  . Smoking status: Current Every Day Smoker    Packs/day: 0.25  . Smokeless tobacco: Never Used  Substance Use Topics  . Alcohol use: Yes    Alcohol/week: 1.0 standard drinks    Types: 1 Cans of beer per week    Comment: occasional  . Drug use: No        Observations/Objective: Physical Exam  VITALS: Patient denies fever. GENERAL: Alert, appears well and in no acute distress. HEENT: Atraumatic. NECK: Normal movements of the head and neck. CARDIOPULMONARY: No increased WOB. Speaking in  clear sentences. I:E ratio WNL.  MS: Moves all visible extremities without noticeable abnormality. PSYCH: Pleasant and cooperative, well-groomed. Speech normal rate and rhythm. Affect is appropriate. Insight and judgement are appropriate. Attention is focused, linear, and appropriate.  NEURO: CN grossly intact. Oriented as arrived to appointment on time with no prompting. Moves both UE equally.  SKIN: No obvious lesions, wounds, erythema, or cyanosis noted on face or hands.   Assessment and Plan:    ICD-10-CM   1. Upper respiratory tract infection, unspecified type  J06.9        Follow Up Instructions: Instructed patient to continue symptomatic treatment with Tylenol, Mucinex, Flonase.  Instructed her to follow-up with her PCP or come here to be seen in person if her symptoms persist.  Patient agrees to plan of care.    I discussed the assessment and treatment plan with the patient. The patient was provided an opportunity to ask questions and all were answered. The patient agreed with the plan and demonstrated an understanding of the instructions.   The patient was advised to call back or seek an in-person evaluation if the symptoms worsen or if the condition fails to improve as anticipated.      Mickie Bail, NP  05/16/2019 3:59 PM         Mickie Bail, NP 05/16/19 1600

## 2019-05-16 NOTE — Discharge Instructions (Addendum)
Continue symptomatic treatment with Tylenol, Mucinex, and Flonase nasal spray.    Follow-up with your primary care provider or come here to be seen in person if your symptoms persist.

## 2019-05-23 ENCOUNTER — Ambulatory Visit (INDEPENDENT_AMBULATORY_CARE_PROVIDER_SITE_OTHER)
Admission: RE | Admit: 2019-05-23 | Discharge: 2019-05-23 | Disposition: A | Discharge status: Home / Self Care | Payer: Self-pay | Source: Ambulatory Visit

## 2019-05-23 DIAGNOSIS — J0100 Acute maxillary sinusitis, unspecified: Secondary | ICD-10-CM

## 2019-05-23 MED ORDER — AMOXICILLIN-POT CLAVULANATE 875-125 MG PO TABS: 1.0000 | ORAL_TABLET | Freq: Two times a day (BID) | ORAL | 0 refills | Status: AC

## 2019-05-23 NOTE — ED Provider Notes (Signed)
Virtual Visit via Video Note:  Diane Stokes  initiated request for Telemedicine visit with [facility] team. I connected with Diane Stokes  on 05/23/2019 at 1:33 PM  for a synchronized telemedicine visit using a video enabled HIPPA compliant telemedicine application. I verified that I am speaking with Diane Stokes  using two identifiers. Mickie Bail, NP  was physically located in a [facility] Urgent care site and Diane Stokes was located at a different location.   The limitations of evaluation and management by telemedicine as well as the availability of in-person appointments were discussed. Patient was informed that she  may incur a bill ( including co-pay) for this virtual visit encounter. Diane Stokes  expressed understanding and gave verbal consent to proceed with virtual visit.     History of Present Illness:Diane Stokes  is a 29 y.o. female presents for evaluation of "sinus infection" x 7+ days.  She reports headache, congestion, post nasal drip, fever (Tmax 101), cough, and sore throat.  She denies difficulty swallowing, rash, shortness of breath, vomiting, diarrhea, or other symptoms.  Treating symptoms at home with Mucinex, Flonase, and Sudafed.  LMP: implant. Denies pregnancy or breastfeeding.     Allergies  Allergen Reactions  . Doxycycline   . Klonopin [Clonazepam]   . Minocycline Hives  . Diflucan [Fluconazole] Rash     Past Medical History:  Diagnosis Date  . HPV (human papilloma virus) infection   . Seasonal allergies      Social History   Tobacco Use  . Smoking status: Current Every Day Smoker    Packs/day: 0.25  . Smokeless tobacco: Never Used  Substance Use Topics  . Alcohol use: Yes    Alcohol/week: 1.0 standard drinks    Types: 1 Cans of beer per week    Comment: occasional  . Drug use: No        Observations/Objective: Physical Exam  VITALS: Patient denies fever. GENERAL: Alert, appears well and in no acute distress. HEENT:  Atraumatic. NECK: Normal movements of the head and neck. CARDIOPULMONARY: No increased WOB. Speaking in clear sentences. I:E ratio WNL.  MS: Moves all visible extremities without noticeable abnormality. PSYCH: Pleasant and cooperative, well-groomed. Speech normal rate and rhythm. Affect is appropriate. Insight and judgement are appropriate. Attention is focused, linear, and appropriate.  NEURO: CN grossly intact. Oriented as arrived to appointment on time with no prompting. Moves both UE equally.  SKIN: No obvious lesions, wounds, erythema, or cyanosis noted on face or hands.   Assessment and Plan:    ICD-10-CM   1. Acute non-recurrent maxillary sinusitis  J01.00        Follow Up Instructions: Treating with Augmentin.  Instructed patient to continue taking Mucinex as needed.  Instructed her to follow-up with her PCP or come here to be seen in person if her symptoms are not improving.  Patient agrees to plan of care.     I discussed the assessment and treatment plan with the patient. The patient was provided an opportunity to ask questions and all were answered. The patient agreed with the plan and demonstrated an understanding of the instructions.   The patient was advised to call back or seek an in-person evaluation if the symptoms worsen or if the condition fails to improve as anticipated.      Mickie Bail, NP  05/23/2019 1:33 PM         Mickie Bail, NP 05/23/19 [phone]

## 2019-05-23 NOTE — Discharge Instructions (Signed)
Take the Augmentin as directed.    Follow up with your primary care provider or come here to be seen in person if your symptoms are not improving.    

## 2020-04-28 ENCOUNTER — Other Ambulatory Visit: Payer: Self-pay

## 2020-04-28 ENCOUNTER — Ambulatory Visit
Admission: EM | Admit: 2020-04-28 | Discharge: 2020-04-28 | Disposition: A | Discharge status: Home / Self Care | Payer: Self-pay | Attending: Emergency Medicine | Admitting: Emergency Medicine

## 2020-04-28 ENCOUNTER — Encounter: Payer: Self-pay | Admitting: Emergency Medicine

## 2020-04-28 DIAGNOSIS — J019 Acute sinusitis, unspecified: Secondary | ICD-10-CM

## 2020-04-28 DIAGNOSIS — J209 Acute bronchitis, unspecified: Secondary | ICD-10-CM

## 2020-04-28 MED ORDER — AMOXICILLIN-POT CLAVULANATE 875-125 MG PO TABS: 1.0000 | ORAL_TABLET | Freq: Two times a day (BID) | ORAL | 0 refills | Status: DC

## 2020-04-28 MED ORDER — PREDNISONE 50 MG PO TABS: 50.0000 mg | ORAL_TABLET | Freq: Every day | ORAL | 0 refills | Status: AC

## 2020-04-28 MED ORDER — ALBUTEROL SULFATE HFA 108 (90 BASE) MCG/ACT IN AERS: 1.0000 | INHALATION_SPRAY | Freq: Four times a day (QID) | RESPIRATORY_TRACT | 0 refills | Status: DC | PRN

## 2020-04-28 MED ORDER — BENZONATATE 200 MG PO CAPS: 200.0000 mg | ORAL_CAPSULE | Freq: Three times a day (TID) | ORAL | 0 refills | Status: AC | PRN

## 2020-04-28 NOTE — ED Provider Notes (Signed)
[facility]    CSN: [id number] Arrival date & time: 04/28/20  1214      History   Chief Complaint Chief Complaint  Patient presents with  . Cough  . Facial Pain    HPI Diane Stokes is a 30 y.o. female presenting today for evaluation of cough and congestion.  Patient reports that she has had cough, sinus pressure and congestion for approximately 1 week.  Had Covid approximately 1 month ago. Reports a lot of headaches and pressure. Also reports increased wheezing and shortness of breath especially at nighttime. Reports tobacco use.  HPI  Past Medical History:  Diagnosis Date  . HPV (human papilloma virus) infection   . Seasonal allergies     Patient Active Problem List   Diagnosis Date Noted  . Palpitations 04/22/2016  . Smoker 04/22/2016  . SOB (shortness of breath) on exertion 04/22/2016  . Class 3 obesity due to excess calories with body mass index (BMI) of 40.0 to 44.9 in adult 12/27/2013  . Morbid obesity (HCC) 05/19/2013  . Abnormal Pap smear of cervix 05/19/2013    Past Surgical History:  Procedure Laterality Date  . FOOT SURGERY      OB History    Gravida  0   Para      Term      Preterm      AB      Living  0     SAB      IAB      Ectopic      Multiple      Live Births               Home Medications    Prior to Admission medications   Medication Sig Start Date End Date Taking? Authorizing Provider  albuterol (VENTOLIN HFA) 108 (90 Base) MCG/ACT inhaler Inhale 1-2 puffs into the lungs every 6 (six) hours as needed for wheezing or shortness of breath. 04/28/20  Yes Caryl Manas C, PA-C  amoxicillin-clavulanate (AUGMENTIN) 875-125 MG tablet Take 1 tablet by mouth every 12 (twelve) hours. 04/28/20  Yes Jakya Dovidio C, PA-C  benzonatate (TESSALON) 200 MG capsule Take 1 capsule (200 mg total) by mouth 3 (three) times daily as needed for up to 7 days for cough. 04/28/20 05/05/20 Yes Deshannon Hinchliffe C, PA-C  predniSONE  (DELTASONE) 50 MG tablet Take 1 tablet (50 mg total) by mouth daily with breakfast for 5 days. 04/28/20 05/03/20 Yes Kinzi Frediani C, PA-C  buPROPion (WELLBUTRIN SR) 100 MG 12 hr tablet Take 100 mg by mouth every morning. 08/14/18   [provider]  Cetirizine HCl 10 MG CAPS Take 1 capsule (10 mg total) by mouth daily. 12/26/18   Estera Ozier C, PA-C  cyclobenzaprine (FLEXERIL) 10 MG tablet Take 1 tablet (10 mg total) by mouth 2 (two) times daily as needed for muscle spasms. 04/18/19   Mickie Bail, NP  fluticasone (FLONASE) 50 MCG/ACT nasal spray Place 1-2 sprays into both nostrils daily for 7 days. 12/26/18 01/02/19  Makaylyn Sinyard C, PA-C  hydrOXYzine (VISTARIL) 25 MG capsule TAKE 1 CAPSULE BY MOUTH EVERY 6 TO 8 HOURS AS NEEDED FOR ANXIETY AND FOR ITCHING 08/14/18   [provider]  ibuprofen (ADVIL) 800 MG tablet Take 1 tablet (800 mg total) by mouth every 8 (eight) hours as needed. 04/18/19   Mickie Bail, NP  metoprolol tartrate (LOPRESSOR) 25 MG tablet Take by mouth. 04/30/16   [provider]  Multiple Vitamin (MULTIVITAMIN)  tablet Take 1 tablet by mouth every morning.     [provider]    Family History Family History  Problem Relation Age of Onset  . Hyperlipidemia Mother   . Hypertension Mother   . Diabetes Maternal Grandfather   . Hypertension Maternal Grandfather     Social History Social History   Tobacco Use  . Smoking status: Current Every Day Smoker    Packs/day: 0.25  . Smokeless tobacco: Never Used  Vaping Use  . Vaping Use: Never used  Substance Use Topics  . Alcohol use: Yes    Alcohol/week: 1.0 standard drink    Types: 1 Cans of beer per week    Comment: occasional  . Drug use: No     Allergies   Doxycycline, Klonopin [clonazepam], Minocycline, and Diflucan [fluconazole]   Review of Systems Review of Systems  Constitutional: Negative for activity change, appetite change, chills, fatigue and fever.  HENT: Positive  for congestion, rhinorrhea, sinus pressure and sore throat. Negative for ear pain and trouble swallowing.   Eyes: Negative for discharge and redness.  Respiratory: Positive for cough and wheezing. Negative for chest tightness and shortness of breath.   Cardiovascular: Negative for chest pain.  Gastrointestinal: Negative for abdominal pain, diarrhea, nausea and vomiting.  Musculoskeletal: Negative for myalgias.  Skin: Negative for rash.  Neurological: Negative for dizziness, light-headedness and headaches.     Physical Exam Triage Vital Signs ED Triage Vitals [04/28/20 1421]  Enc Vitals Group     BP (!) 142/96     Pulse Rate 90     Resp 18     Temp 98.7 F (37.1 C)     Temp Source Oral     SpO2 98 %     Weight      Height      Head Circumference      Peak Flow      Pain Score 6     Pain Loc      Pain Edu?      Excl. in GC?    No data found.  Updated Vital Signs BP (!) 142/96 (BP Location: Right Arm)   Pulse 90   Temp 98.7 F (37.1 C) (Oral)   Resp 18   SpO2 98%   Visual Acuity Right Eye Distance:   Left Eye Distance:   Bilateral Distance:    Right Eye Near:   Left Eye Near:    Bilateral Near:     Physical Exam Vitals and nursing note reviewed.  Constitutional:      Appearance: She is well-developed and well-nourished.     Comments: No acute distress  HENT:     Head: Normocephalic and atraumatic.     Ears:     Comments: Bilateral ears without tenderness to palpation of external auricle, tragus and mastoid, EAC's without erythema or swelling, TM's with good bony landmarks and cone of light. Non erythematous.     Nose: Nose normal.     Mouth/Throat:     Comments: Oral mucosa pink and moist, no tonsillar enlargement or exudate. Posterior pharynx patent and nonerythematous, no uvula deviation or swelling. Normal phonation. Eyes:     Conjunctiva/sclera: Conjunctivae normal.  Cardiovascular:     Rate and Rhythm: Normal rate.  Pulmonary:     Effort:  Pulmonary effort is normal. No respiratory distress.     Comments: Breathing comfortably at rest, CTABL, no wheezing, rales or other adventitious sounds auscultated Abdominal:     General: There is no  distension.  Musculoskeletal:        General: Normal range of motion.     Cervical back: Neck supple.  Skin:    General: Skin is warm and dry.  Neurological:     Mental Status: She is alert and oriented to person, place, and time.  Psychiatric:        Mood and Affect: Mood and affect normal.      UC Treatments / Results  Labs (all labs ordered are listed, but only abnormal results are displayed) Labs Reviewed - No data to display  EKG   Radiology No results found.  Procedures Procedures (including critical care time)  Medications Ordered in UC Medications - No data to display  Initial Impression / Assessment and Plan / UC Course  I have reviewed the triage vital signs and the nursing notes.  Pertinent labs & imaging results that were available during my care of the patient were reviewed by me and considered in my medical decision making (see chart for details).     Treating for sinusitis/bronchitis with Augmentin, Tessalon for cough, prednisone and albuterol inhaler  Discussed strict return precautions. Patient verbalized understanding and is agreeable with plan.  Final Clinical Impressions(s) / UC Diagnoses   Final diagnoses:  Acute sinusitis with symptoms > 10 days  Acute bronchitis, unspecified organism     Discharge Instructions     Begin Augmentin twice daily for 1 week Continue Mucinex Prednisone daily for 5 days Tessalon for cough Albuterol inhaler as needed for shortness of breath and wheezing     ED Prescriptions    Medication Sig Dispense Auth. Provider   benzonatate (TESSALON) 200 MG capsule Take 1 capsule (200 mg total) by mouth 3 (three) times daily as needed for up to 7 days for cough. 28 capsule Loana Salvaggio C, PA-C    amoxicillin-clavulanate (AUGMENTIN) 875-125 MG tablet Take 1 tablet by mouth every 12 (twelve) hours. 14 tablet Jeyli Zwicker C, PA-C   predniSONE (DELTASONE) 50 MG tablet Take 1 tablet (50 mg total) by mouth daily with breakfast for 5 days. 5 tablet Cate Oravec C, PA-C   albuterol (VENTOLIN HFA) 108 (90 Base) MCG/ACT inhaler Inhale 1-2 puffs into the lungs every 6 (six) hours as needed for wheezing or shortness of breath. 18 g Jayson Waterhouse, [place] C, PA-C     PDMP not reviewed this encounter.   Lew Dawes, [place] 04/28/20 1527

## 2020-04-28 NOTE — ED Triage Notes (Signed)
Pt sts cough and facial pain with sinus congestion x 5 days; pt sts had covid 90 days ago

## 2020-04-28 NOTE — Discharge Instructions (Signed)
Begin Augmentin twice daily for 1 week Continue Mucinex Prednisone daily for 5 days Tessalon for cough Albuterol inhaler as needed for shortness of breath and wheezing

## 2020-07-15 ENCOUNTER — Other Ambulatory Visit: Payer: Self-pay

## 2020-07-15 ENCOUNTER — Ambulatory Visit
Admission: EM | Admit: 2020-07-15 | Discharge: 2020-07-15 | Disposition: A | Discharge status: Home / Self Care | Payer: Self-pay | Attending: Physician Assistant | Admitting: Physician Assistant

## 2020-07-15 ENCOUNTER — Ambulatory Visit (INDEPENDENT_AMBULATORY_CARE_PROVIDER_SITE_OTHER): Payer: Self-pay

## 2020-07-15 DIAGNOSIS — M25562 Pain in left knee: Secondary | ICD-10-CM

## 2020-07-15 DIAGNOSIS — R2242 Localized swelling, mass and lump, left lower limb: Secondary | ICD-10-CM

## 2020-07-15 NOTE — Discharge Instructions (Signed)
Return if any problems.

## 2020-07-15 NOTE — ED Provider Notes (Signed)
[facility]    CSN: [id number] Arrival date & time: 07/15/20  1503      History   Chief Complaint Chief Complaint  Patient presents with  . Knee Pain    HPI Diane Stokes is a 30 y.o. female.   The history is provided by the patient. No language interpreter was used.  Foot Pain This is a new problem. The current episode started more than 2 days ago. The problem occurs constantly. The problem has been gradually worsening. Nothing aggravates the symptoms. She has tried nothing for the symptoms.  Pt concerned that she has a  Fracture from being flat footed and weight.   Past Medical History:  Diagnosis Date  . HPV (human papilloma virus) infection   . Seasonal allergies     Patient Active Problem List   Diagnosis Date Noted  . Palpitations 04/22/2016  . Smoker 04/22/2016  . SOB (shortness of breath) on exertion 04/22/2016  . Class 3 obesity due to excess calories with body mass index (BMI) of 40.0 to 44.9 in adult 12/27/2013  . Morbid obesity (HCC) 05/19/2013  . Abnormal Pap smear of cervix 05/19/2013    Past Surgical History:  Procedure Laterality Date  . FOOT SURGERY      OB History    Gravida  0   Para      Term      Preterm      AB      Living  0     SAB      IAB      Ectopic      Multiple      Live Births               Home Medications    Prior to Admission medications   Medication Sig Start Date End Date Taking? Authorizing Provider  albuterol (VENTOLIN HFA) 108 (90 Base) MCG/ACT inhaler Inhale 1-2 puffs into the lungs every 6 (six) hours as needed for wheezing or shortness of breath. 04/28/20   Wieters, Hallie C, PA-C  amoxicillin-clavulanate (AUGMENTIN) 875-125 MG tablet Take 1 tablet by mouth every 12 (twelve) hours. 04/28/20   Wieters, Hallie C, PA-C  buPROPion (WELLBUTRIN SR) 100 MG 12 hr tablet Take 100 mg by mouth every morning. 08/14/18   [provider]  Cetirizine HCl 10 MG CAPS Take 1 capsule (10 mg  total) by mouth daily. 12/26/18   Wieters, Hallie C, PA-C  cyclobenzaprine (FLEXERIL) 10 MG tablet Take 1 tablet (10 mg total) by mouth 2 (two) times daily as needed for muscle spasms. 04/18/19   Mickie Bail, NP  fluticasone (FLONASE) 50 MCG/ACT nasal spray Place 1-2 sprays into both nostrils daily for 7 days. 12/26/18 01/02/19  Wieters, Hallie C, PA-C  hydrOXYzine (VISTARIL) 25 MG capsule TAKE 1 CAPSULE BY MOUTH EVERY 6 TO 8 HOURS AS NEEDED FOR ANXIETY AND FOR ITCHING 08/14/18   [provider]  ibuprofen (ADVIL) 800 MG tablet Take 1 tablet (800 mg total) by mouth every 8 (eight) hours as needed. 04/18/19   Mickie Bail, NP  metoprolol tartrate (LOPRESSOR) 25 MG tablet Take by mouth. 04/30/16   [provider]  Multiple Vitamin (MULTIVITAMIN) tablet Take 1 tablet by mouth every morning.     [provider]    Family History Family History  Problem Relation Age of Onset  . Hyperlipidemia Mother   . Hypertension Mother   . Diabetes Maternal Grandfather   . Hypertension Maternal Grandfather  Social History Social History   Tobacco Use  . Smoking status: Current Every Day Smoker    Packs/day: 0.25  . Smokeless tobacco: Never Used  Vaping Use  . Vaping Use: Never used  Substance Use Topics  . Alcohol use: Yes    Alcohol/week: 1.0 standard drink    Types: 1 Cans of beer per week    Comment: occasional  . Drug use: No     Allergies   Doxycycline, Klonopin [clonazepam], Minocycline, and Diflucan [fluconazole]   Review of Systems Review of Systems  All other systems reviewed and are negative.    Physical Exam Triage Vital Signs ED Triage Vitals [07/15/20 1551]  Enc Vitals Group     BP 123/86     Pulse Rate (!) 102     Resp 18     Temp 97.9 F (36.6 C)     Temp src      SpO2 98 %     Weight      Height      Head Circumference      Peak Flow      Pain Score 9     Pain Loc      Pain Edu?      Excl. in GC?    No data found.  Updated  Vital Signs BP 123/86   Pulse (!) 102   Temp 97.9 F (36.6 C)   Resp 18   SpO2 98%   Visual Acuity Right Eye Distance:   Left Eye Distance:   Bilateral Distance:    Right Eye Near:   Left Eye Near:    Bilateral Near:     Physical Exam Vitals and nursing note reviewed.  Constitutional:      Appearance: She is well-developed.  HENT:     Head: Normocephalic.  Musculoskeletal:        General: Normal range of motion.     Comments: Tender 5th metatarsal area and ankle  nv and ns intact   Skin:    General: Skin is warm.  Neurological:     General: No focal deficit present.     Mental Status: She is alert and oriented to person, place, and time.      UC Treatments / Results  Labs (all labs ordered are listed, but only abnormal results are displayed) Labs Reviewed - No data to display  EKG   Radiology No results found.  Procedures Procedures (including critical care time)  Medications Ordered in UC Medications - No data to display  Initial Impression / Assessment and Plan / UC Course  I have reviewed the triage vital signs and the nursing notes.  Pertinent labs & imaging results that were available during my care of the patient were reviewed by me and considered in my medical decision making (see chart for details).     MDM:  No fracture  On xray  Final Clinical Impressions(s) / UC Diagnoses   Final diagnoses:  Localized swelling of left foot   Discharge Instructions   None    ED Prescriptions    None     PDMP not reviewed this encounter.  An After Visit Summary was printed and given to the patient.    Elson Areas, [place] 07/15/20 [phone]

## 2020-07-15 NOTE — ED Triage Notes (Signed)
Pt presents with c/o left knee pain that began about 3 weeks ago, pt states she wears a brace and sweellig has increased, pain is in front of knee , pt also has ankle swelling as well

## 2021-05-01 DIAGNOSIS — F32A Depression, unspecified: Secondary | ICD-10-CM | POA: Insufficient documentation

## 2021-05-01 DIAGNOSIS — A5149 Other secondary syphilitic conditions: Secondary | ICD-10-CM | POA: Insufficient documentation

## 2021-05-29 DIAGNOSIS — Z975 Presence of (intrauterine) contraceptive device: Secondary | ICD-10-CM | POA: Insufficient documentation

## 2022-01-30 ENCOUNTER — Emergency Department
Admission: EM | Admit: 2022-01-30 | Discharge: 2022-01-30 | Disposition: A | Discharge status: Home / Self Care | Payer: PPO

## 2022-01-30 ENCOUNTER — Encounter (HOSPITAL_COMMUNITY): Payer: Self-pay

## 2022-01-30 ENCOUNTER — Other Ambulatory Visit: Payer: Self-pay

## 2022-01-30 DIAGNOSIS — F17210 Nicotine dependence, cigarettes, uncomplicated: Secondary | ICD-10-CM | POA: Insufficient documentation

## 2022-01-30 DIAGNOSIS — K029 Dental caries, unspecified: Secondary | ICD-10-CM | POA: Insufficient documentation

## 2022-01-30 DIAGNOSIS — I10 Essential (primary) hypertension: Secondary | ICD-10-CM | POA: Insufficient documentation

## 2022-01-30 HISTORY — DX: Depression, unspecified: F32.A

## 2022-01-30 HISTORY — DX: Anxiety disorder, unspecified: F41.9

## 2022-01-30 MED ORDER — AMOXICILLIN 875 MG-POTASSIUM CLAVULANATE 125 MG TABLET
1.0000 | ORAL_TABLET | Freq: Two times a day (BID) | ORAL | 0 refills | Status: DC
Start: 2022-01-30 — End: 2022-02-12

## 2022-01-30 MED ORDER — PREDNISONE 50 MG TABLET
50.0000 mg | ORAL_TABLET | Freq: Every day | ORAL | 0 refills | Status: DC
Start: 2022-01-30 — End: 2022-02-12

## 2022-01-30 NOTE — ED Nurses Note (Signed)
Patient discharged home with family.  AVS reviewed with patient/care giver.  A written copy of the AVS and discharge instructions was given to the patient/care giver.  Questions sufficiently answered as needed.  Patient/care giver encouraged to follow up with PCP as indicated.  In the event of an emergency, patient/care giver instructed to call 911 or go to the nearest emergency room.     Diavion Labrador, RN

## 2022-01-30 NOTE — Discharge Instructions (Addendum)
1) Follow up with primary care provider and dentist   2) Return to ER if symptoms worsen, or if new concerning symptoms arise  3) Start taking oral antihistamine such as Claritin or Zyrtec

## 2022-01-30 NOTE — ED Provider Notes (Signed)
Department of Emergency [facility]  HPI - 01/30/2022      Name: Kristina Baxter   Date of Birth: 1990/10/05   Date: 01/30/2022 18:11   MRN:  [id number]     Chief Complaint:    Patient is a 31 y.o.  female presenting to the emergency department with chief complaint of sinus issues, facial swelling      History of Present Illness:    31 year old female presents to the emergency sided for right sided facial swelling and sinus issues. Patient states she has had sinus pressure for a few weeks. She states she has had rhinorrhea over the last few weeks. She states she [place] this [place] from [place], and has had allergies and sinus pressure since the weather changed. Patient states she believes she has a dental cavity on the side of her facial swelling. She states she is waiting for her dental insurance to start before she sees a Pharmacist, community. Denies difficulty swallowing. No other complaints.     Review of Systems:    Constitutional: No fever, chills  HEENT: no headache, vision changes, cough, congestion, ear or throat pain  Respiratory: no shortness of breath  Cardiac: no chest pain, palpitations   GI: no nausea, vomiting, diarrhea, constipation, or abdominal pain  GU: no dysuria, hesitancy, frequency, or rentention   Skin: No rashes or lesions  MSK: No joint pain.  No neck or back pain  Neuro: No numbness, tingling, or weakness.  Psych: No SI or HI. Normal mood  All other systems were reviewed and were negative except for what is mentioned in the HPI.    Past Medical History:    Past Medical History:   Diagnosis Date    Anxiety     Depression      Past Surgical History:   Procedure Laterality Date    Dental surgery      Foot surgery Right        Above history reviewed with patient.  Allergies, medication list, and old records also reviewed.     There is no problem list on file for this patient.      Social History:    Social History     Tobacco Use    Smoking status: Every Day     Packs/day:  .25     Types: Cigarettes    Smokeless tobacco: Never         Filed Vitals:    01/30/22 1802   BP: (!) 131/91   Pulse: (!) 124   Resp: 19   Temp: 36.9 C (98.4 F)   SpO2: 98%       Physical Exam:     Nursing note and vitals reviewed.  Vital signs reviewed as above. No acute distress.   Constitutional: Pt is well-developed and well-nourished.   Head: Normocephalic and atraumatic.   Eyes: Conjunctivae are normal.   Mouth: Moist, without erythema or exudate  Neck: Soft, supple, full range of motion, no lymphadenopathy   Heart: Normal rate, S1 S2 normal, no murmur or gallop  Pulmonary/Chest: No respiratory distress, bilateral breath sounds clear, no wheezing  Abdomen: Soft, normal active bowel sounds, non-tender, non-distended, no rigidity or guarding   Musculoskeletal: Normal range of motion. No deformities. Pulses palp, no erythema or discoloration, cap refill <3. Sensation intact.   Neurological: No focal deficits noted. Alert and oriented. Speech clear and appropriate interactions.   Psychiatric: Patient has a normal mood and affect.  Labs:    No results found for any visits on 01/30/22.    Imaging:         Orders Placed This Encounter    amoxicillin-pot clavulanate (AUGMENTIN) 875-125 mg Oral Tablet    predniSONE (DELTASONE) 50 mg Oral Tablet           MDM:      - Patient is non-distressed, non-toxic, and vitally stable. Patient presents with right mandibular jaw swelling and sinus pressure and rhinorrhea. Physical exam shows multiple dental caries on the right molars and premolars. No obvious abscess or erythema seen. No tenderness or erythema of the sublingual floor consistent with Ludwig angina. Uvula midline. No evidence of peritonsillar abscess. Patient is vitally stable. Prescribed Augmentin and Prednisone. Follow up with PCP and dentist.    - Follow up with primary care provider. Return to ER if symptoms worsen, or if new concerning symptoms develop  - During the patient's stay in the emergency  department, the above listed imaging and/or labs were performed to assist with medical decision making and were reviewed by myself as available for review. Results discussed with patient.  - All questions/concerns addressed. Patient is agreeable with course of action and expresses understanding    IMPRESSION:   Clinical Impression   Dental caries (Primary)           DISPOSITION/PLAN:    Discharged  PCP    Schedule an appointment as soon as possible for a visit       Nonnie Done  [address]  [place] [address]  [phone]    Schedule an appointment as soon as possible for a visit in 1 week          Portions of this note may be dictated using voice recognition software or a dictation service. Variances in spelling and vocabulary are possible and unintentional. Not all errors are caught/corrected. Please notify the [facility] if any discrepancies are noted or if the meaning of any statement is not clear.

## 2022-01-30 NOTE — ED Triage Notes (Signed)
Patient presents to ED POV for R facial swelling and pain, and also she reports sinus issue.

## 2022-02-03 ENCOUNTER — Other Ambulatory Visit: Payer: Self-pay

## 2022-02-05 ENCOUNTER — Other Ambulatory Visit: Payer: Self-pay

## 2022-02-05 ENCOUNTER — Emergency Department (HOSPITAL_COMMUNITY): Payer: PPO

## 2022-02-05 ENCOUNTER — Emergency Department
Admission: EM | Admit: 2022-02-05 | Discharge: 2022-02-05 | Disposition: A | Discharge status: Home / Self Care | Payer: PPO | Attending: Emergency Medicine | Admitting: Emergency Medicine

## 2022-02-05 ENCOUNTER — Encounter (HOSPITAL_COMMUNITY): Payer: Self-pay

## 2022-02-05 DIAGNOSIS — F17210 Nicotine dependence, cigarettes, uncomplicated: Secondary | ICD-10-CM | POA: Insufficient documentation

## 2022-02-05 DIAGNOSIS — R000 Tachycardia, unspecified: Secondary | ICD-10-CM | POA: Insufficient documentation

## 2022-02-05 DIAGNOSIS — Z79899 Other long term (current) drug therapy: Secondary | ICD-10-CM | POA: Insufficient documentation

## 2022-02-05 DIAGNOSIS — Z3202 Encounter for pregnancy test, result negative: Secondary | ICD-10-CM | POA: Insufficient documentation

## 2022-02-05 DIAGNOSIS — F419 Anxiety disorder, unspecified: Secondary | ICD-10-CM | POA: Insufficient documentation

## 2022-02-05 LAB — COMPREHENSIVE METABOLIC PANEL, NON-FASTING
ALBUMIN: 4.1 g/dL (ref 3.5–5.0)
ALKALINE PHOSPHATASE: 82 U/L (ref 40–110)
ALT (SGPT): 41 U/L — ABNORMAL HIGH (ref 8–22)
ANION GAP: 8 mmol/L (ref 4–13)
AST (SGOT): 18 U/L (ref 8–45)
BILIRUBIN TOTAL: 0.5 mg/dL (ref 0.3–1.3)
BUN/CREA RATIO: 11 (ref 6–22)
BUN: 10 mg/dL (ref 8–25)
CALCIUM: 9.2 mg/dL (ref 8.6–10.2)
CHLORIDE: 106 mmol/L (ref 96–111)
CO2 TOTAL: 25 mmol/L (ref 22–30)
CREATININE: 0.87 mg/dL (ref 0.60–1.05)
ESTIMATED GFR - FEMALE: 90 mL/min/BSA (ref >=60)
GLUCOSE: 98 mg/dL (ref 65–125)
POTASSIUM: 3.6 mmol/L (ref 3.5–5.1)
PROTEIN TOTAL: 7.6 g/dL (ref 6.4–8.3)
SODIUM: 139 mmol/L (ref 136–145)

## 2022-02-05 LAB — CBC WITH DIFF
BASOPHIL #: 0.04 10*3/uL (ref <=0.20)
BASOPHIL %: 0 %
EOSINOPHIL #: 0.26 10*3/uL (ref <=0.50)
EOSINOPHIL %: 3 %
HCT: 45.6 % (ref 34.8–46.0)
HGB: 15.6 g/dL (ref 11.5–16.0)
IMMATURE GRANULOCYTE #: <0.04 10*3/uL (ref <0.10)
IMMATURE GRANULOCYTE %: 0 % (ref 0–1)
LYMPHOCYTE #: 3.42 10*3/uL (ref 1.00–4.80)
LYMPHOCYTE %: 38 %
MCH: 31.6 pg (ref 26.0–32.0)
MCHC: 34.2 g/dL (ref 31.0–35.5)
MCV: 92.5 fL (ref 78.0–100.0)
MONOCYTE #: 0.72 10*3/uL (ref 0.20–1.10)
MONOCYTE %: 8 %
MPV: 8.4 fL — ABNORMAL LOW (ref 8.7–12.5)
NEUTROPHIL #: 4.49 10*3/uL (ref 1.50–7.70)
NEUTROPHIL %: 51 %
PLATELETS: 301 10*3/uL (ref 150–400)
RBC: 4.93 10*6/uL (ref 3.85–5.22)
RDW-CV: 11.2 % — ABNORMAL LOW (ref 11.5–15.5)
WBC: 9 10*3/uL (ref 3.7–11.0)

## 2022-02-05 LAB — HCG, SERUM QUALITATIVE, PREGNANCY: PREGNANCY, SERUM QUALITATIVE: NEGATIVE

## 2022-02-05 LAB — LAVENDER TOP TUBE

## 2022-02-05 LAB — D-DIMER: D-DIMER: <200 ng/mL DDU (ref <200)

## 2022-02-05 LAB — MAGNESIUM: MAGNESIUM: 2 mg/dL (ref 1.8–2.6)

## 2022-02-05 LAB — THYROID STIMULATING HORMONE (SENSITIVE TSH): TSH: 3.311 u[IU]/mL (ref 0.350–4.940)

## 2022-02-05 LAB — LIGHT GREEN TOP TUBE

## 2022-02-05 MED ORDER — SODIUM CHLORIDE 0.9 % (FLUSH) INJECTION SYRINGE
3.0000 mL | INJECTION | INTRAMUSCULAR | Status: DC | PRN
Start: 2022-02-05 — End: 2022-02-05

## 2022-02-05 MED ORDER — SODIUM CHLORIDE 0.9 % IV BOLUS
1000.0000 mL | INJECTION | Status: AC
Start: 2022-02-05 — End: 2022-02-05
  Administered 2022-02-05: 1000 mL via INTRAVENOUS
  Filled 2022-02-05: qty 1000

## 2022-02-05 MED ORDER — BUPROPION HCL SR 150 MG TABLET,12 HR SUSTAINED-RELEASE
150.0000 mg | ORAL_TABLET | Freq: Every day | ORAL | 0 refills | Status: DC
Start: 2022-02-05 — End: 2022-02-12

## 2022-02-05 MED ORDER — HYDROXYZINE PAMOATE 25 MG CAPSULE
25.0000 mg | ORAL_CAPSULE | Freq: Three times a day (TID) | ORAL | 0 refills | Status: DC | PRN
Start: 2022-02-05 — End: 2022-02-12

## 2022-02-05 MED ORDER — SODIUM CHLORIDE 0.9 % (FLUSH) INJECTION SYRINGE
3.0000 mL | INJECTION | Freq: Three times a day (TID) | INTRAMUSCULAR | Status: DC
Start: 2022-02-05 — End: 2022-02-05
  Administered 2022-02-05: 3 mL

## 2022-02-05 NOTE — ED Triage Notes (Signed)
Pt to ED c/o tachycardia for the last week. Pt reports she thinks she is having a panic attack. Pt has been off her anxiety medication for 8 months.

## 2022-02-05 NOTE — ED Provider Notes (Signed)
Department of Emergency Kristina Baxter  HPI - 02/05/2022      Patient is a 31 y.o.  female presenting to the ED with chief complaint of S heart rate anxiety.  Been off some anxiety medicine for a month or so.  She is running out of her Wellbutrin.  No other complaints.  Fast heart rate..         Review of Systems:    Constitutional: No fever, chills  Skin: No rashes or lesions  MSK: No joint pain.  No neck or back pain  Neuro: No numbness, tingling, or weakness.  Complains of fast heart rate and anxiety.      Past Medical History:  Past Medical History:  Past Medical History:   Diagnosis Date    Anxiety     Depression        Past Surgical History:  Past Surgical History:   Procedure Laterality Date    Dental surgery      Foot surgery Right        Social History:  Social History     Tobacco Use    Smoking status: Every Day     Packs/day: .25     Types: Cigarettes    Smokeless tobacco: Never     Social History     Substance and Sexual Activity   Drug Use Not on file       Family History:  No family history on file.      Above history reviewed with patient.    Above history reviewed with patient.  Allergies, medication list, and old records also reviewed.     Filed Vitals:    02/05/22 1539 02/05/22 1540 02/05/22 1541 02/05/22 1542   BP:       Pulse: (!) 120 (!) 128 (!) 111 (!) 113   Resp: (!) 22 (!) 32 (!) 25 20   Temp:       SpO2: 100% 100% 100% 100%       Physical Exam:     Nursing note and vitals reviewed.  Vital signs reviewed as above. No acute distress.   Constitutional:  Female in no acute distress.  Slightly elevated heart rate.  Head: Normocephalic and atraumatic.   Eyes: Conjunctivae are normal. P  Neck: Soft, supple, full range of motion.  Pulmonary/Chest: No respiratory distress heart rate is a little fast.  Low 100s  Musculoskeletal: Normal range of motion. No deformities.  Neurological: CNs has test rossly intact.  No focal deficits noted.  Skin: No rash or lesions  Psychiatric: Patient  has a normal mood and affect.     Workup:     Labs:  Results for orders placed or performed during the Baxter encounter of 02/05/22 (from the past 24 hour(s))   EXTRA TUBES - [place]    Narrative    The following orders were created for panel order EXTRA TUBES - [place].  Procedure                               Abnormality         Status                     ---------                               -----------         ------  LIGHT GREEN TOP [id number]                             In process                 LAVENDER TOP [id number]:[id number]                                In process                   Please view results for these tests on the individual orders.   CBC/DIFF    Narrative    The following orders were created for panel order CBC/DIFF.  Procedure                               Abnormality         Status                     ---------                               -----------         ------                     CBC WITH [id number]:[id number]                                                                 Please view results for these tests on the individual orders.       Imaging:         Orders Placed This Encounter    EXTRA TUBES - [place]    LIGHT GREEN TOP TUBE    LAVENDER TOP TUBE    CBC/DIFF    COMPREHENSIVE METABOLIC PANEL, NON-FASTING    MAGNESIUM    HCG, SERUM QUALITATIVE, PREGNANCY    CBC WITH DIFF    ECG 12 LEAD    INSERT & MAINTAIN PERIPHERAL IV ACCESS    PERIPHERAL IV DRESSING CHANGE    AND Linked Order Group     NS flush syringe     NS flush syringe    NS bolus infusion 1,000 mL       Abnormal Lab results:  Labs Ordered/Reviewed - No data to display    EKG 105 normal PR QRS QT normal axis normal STs normal T-waves  Plan: Appropriate labs and imaging ordered. Medical Records reviewed.  Labs are all normal.  True D-dimer was less than 200.  Her TSH was normal.  MDM:   During the patient's stay in the emergency department, the above listed imaging and/or labs were performed to assist with medical  decision making and were reviewed by myself when available for review.     Pt remained stable throughout the emergency department course.      Impression:  Tachycardia, etiology uncertain.  Plan:  Almost out of her anxiety medicines which is Wellbutrin I am also going to give her some Vistaril.  She is going to make a doctor appointment with a  local doctor.  I have not found anything serious this point she is to return if worse.

## 2022-02-05 NOTE — Discharge Instructions (Signed)
Follow-up with your local PCP.  Medicines as directed.  Return if you have any worsening signs symptoms or concerns

## 2022-02-05 NOTE — ED Nurses Note (Signed)
Patient discharged home.  AVS reviewed with patient/care giver.  A written copy of the AVS and discharge instructions was given to the patient/care giver.  Questions sufficiently answered as needed.  Patient/care giver encouraged to follow up with PCP as indicated.  In the event of an emergency, patient/care giver instructed to call 911 or go to the nearest emergency room.

## 2022-02-10 LAB — ECG 12 LEAD
Atrial Rate: 105 {beats}/min
Calculated P Axis: 67 degrees
Calculated R Axis: 10 degrees
Calculated T Axis: 26 degrees
PR Interval: 144 ms
QRS Duration: 74 ms
QT Interval: 318 ms
QTC Calculation: 420 ms
Ventricular rate: 105 {beats}/min

## 2022-02-11 ENCOUNTER — Encounter (HOSPITAL_BASED_OUTPATIENT_CLINIC_OR_DEPARTMENT_OTHER): Payer: Self-pay | Admitting: Family Medicine

## 2022-02-12 ENCOUNTER — Encounter (HOSPITAL_BASED_OUTPATIENT_CLINIC_OR_DEPARTMENT_OTHER): Payer: Self-pay | Admitting: Family Medicine

## 2022-02-12 ENCOUNTER — Other Ambulatory Visit: Payer: Self-pay

## 2022-02-12 ENCOUNTER — Ambulatory Visit: Payer: PPO | Attending: Family Medicine | Admitting: Family Medicine

## 2022-02-12 ENCOUNTER — Telehealth (HOSPITAL_BASED_OUTPATIENT_CLINIC_OR_DEPARTMENT_OTHER): Payer: Self-pay | Admitting: Family Medicine

## 2022-02-12 VITALS — BP 120/80 | HR 107 | Temp 98.7°F | Ht 62.5 in | Wt 258.4 lb

## 2022-02-12 DIAGNOSIS — F419 Anxiety disorder, unspecified: Secondary | ICD-10-CM | POA: Insufficient documentation

## 2022-02-12 DIAGNOSIS — Z7689 Persons encountering health services in other specified circumstances: Secondary | ICD-10-CM | POA: Insufficient documentation

## 2022-02-12 DIAGNOSIS — R000 Tachycardia, unspecified: Secondary | ICD-10-CM | POA: Insufficient documentation

## 2022-02-12 DIAGNOSIS — F17210 Nicotine dependence, cigarettes, uncomplicated: Secondary | ICD-10-CM | POA: Insufficient documentation

## 2022-02-12 DIAGNOSIS — G4710 Hypersomnia, unspecified: Secondary | ICD-10-CM | POA: Insufficient documentation

## 2022-02-12 DIAGNOSIS — I10 Essential (primary) hypertension: Secondary | ICD-10-CM | POA: Insufficient documentation

## 2022-02-12 DIAGNOSIS — Z114 Encounter for screening for human immunodeficiency virus [HIV]: Secondary | ICD-10-CM | POA: Insufficient documentation

## 2022-02-12 MED ORDER — BUPROPION HCL SR 150 MG TABLET,12 HR SUSTAINED-RELEASE
150.0000 mg | ORAL_TABLET | Freq: Every day | ORAL | 1 refills | Status: DC
Start: 2022-02-12 — End: 2022-07-11

## 2022-02-12 MED ORDER — METOPROLOL SUCCINATE ER 25 MG TABLET,EXTENDED RELEASE 24 HR
25.0000 mg | ORAL_TABLET | Freq: Every day | ORAL | 4 refills | Status: DC
Start: 2022-02-12 — End: 2022-03-26

## 2022-02-12 MED ORDER — HYDROXYZINE PAMOATE 25 MG CAPSULE
25.0000 mg | ORAL_CAPSULE | Freq: Three times a day (TID) | ORAL | 1 refills | Status: DC | PRN
Start: 2022-02-12 — End: 2022-03-26

## 2022-02-12 NOTE — Telephone Encounter (Signed)
02/12/2022-LVM for return call to schedule the pick up for her 7 Holter monitor, the other 2 orders need auth-[facility]

## 2022-02-12 NOTE — Nursing Note (Signed)
02/12/22 1403   Gad-7 (Over the last 2 weeks, how often have you been bothered by the following problems?)   Feeling nervous,anxious,on edge 3   Not being able to stop or control worrying 3   Worrying too much about different things 3   Trouble relaxing 3   Being so restless that it is hard to sit still 3   Becoming easily annoyed or irritable 3   Feeling afraid as if something awful might happen 2   How difficult have these problems made it for you to work, take care of things at home, or get along with other people? Somewhat difficult   Gad-7 Score   Gad-7 Score Total 20   Interpretation 15 or more, severe anxiety

## 2022-02-12 NOTE — Nursing Note (Signed)
02/12/22 1403   Depression Screen   Little interest or pleasure in doing things. 3   Feeling down, depressed, or hopeless 3   PHQ 2 Total 6   Trouble falling or staying asleep, or sleeping too much. 3   Feeling tired or having little energy 2   Poor appetite or overeating 2   Feeling bad about yourself/ that you are a failure in the past 2 weeks? 3   Trouble concentrating on things in the past 2 weeks? 3   Moving/Speaking slowly or being fidgety or restless  in the past 2 weeks? 3   Thoughts that you would be better off DEAD, or of hurting yourself in some way. 0   If you checked off any problems, how difficult have these problems made it for you to do your work, take care of things at home, or get along with other people? Somewhat difficult   PHQ 9 Total 22   Interpretation of Total Score Severe depression

## 2022-02-13 ENCOUNTER — Other Ambulatory Visit: Payer: PPO | Attending: Family Medicine

## 2022-02-13 DIAGNOSIS — Z7689 Persons encountering health services in other specified circumstances: Secondary | ICD-10-CM | POA: Insufficient documentation

## 2022-02-13 LAB — COMPREHENSIVE METABOLIC PNL, FASTING
ALBUMIN: 3.6 g/dL (ref 3.5–5.0)
ALKALINE PHOSPHATASE: 67 U/L (ref 40–110)
ALT (SGPT): 58 U/L — ABNORMAL HIGH (ref 8–22)
ANION GAP: 5 mmol/L (ref 4–13)
AST (SGOT): 36 U/L (ref 8–45)
BILIRUBIN TOTAL: 0.6 mg/dL (ref 0.3–1.3)
BUN/CREA RATIO: 16 (ref 6–22)
BUN: 13 mg/dL (ref 8–25)
CALCIUM: 9 mg/dL (ref 8.6–10.2)
CHLORIDE: 109 mmol/L (ref 96–111)
CO2 TOTAL: 25 mmol/L (ref 22–30)
CREATININE: 0.83 mg/dL (ref 0.60–1.05)
ESTIMATED GFR - FEMALE: >90 mL/min/BSA (ref >=60)
GLUCOSE: 104 mg/dL — ABNORMAL HIGH (ref 70–99)
POTASSIUM: 4.2 mmol/L (ref 3.5–5.1)
PROTEIN TOTAL: 6.9 g/dL (ref 6.4–8.3)
SODIUM: 139 mmol/L (ref 136–145)

## 2022-02-13 LAB — URINALYSIS, MACROSCOPIC
BILIRUBIN: NEGATIVE mg/dL
BLOOD: NEGATIVE mg/dL
GLUCOSE: NEGATIVE mg/dL
KETONES: NEGATIVE mg/dL
LEUKOCYTES: NEGATIVE WBCs/uL
NITRITE: NEGATIVE
PH: 8 (ref 5.0–8.0)
PROTEIN: NEGATIVE mg/dL
SPECIFIC GRAVITY: 1.02 (ref 1.005–1.030)
UROBILINOGEN: 0.2 mg/dL (ref 0.2–1.0)

## 2022-02-13 LAB — CBC WITH DIFF
BASOPHIL #: 0.04 10*3/uL (ref <=0.20)
BASOPHIL %: 1 %
EOSINOPHIL #: 0.18 10*3/uL (ref <=0.50)
EOSINOPHIL %: 3 %
HCT: 41.7 % (ref 34.8–46.0)
HGB: 14.3 g/dL (ref 11.5–16.0)
IMMATURE GRANULOCYTE #: <0.04 10*3/uL (ref <0.10)
IMMATURE GRANULOCYTE %: 0 % (ref 0–1)
LYMPHOCYTE #: 1.62 10*3/uL (ref 1.00–4.80)
LYMPHOCYTE %: 29 %
MCH: 32.5 pg — ABNORMAL HIGH (ref 26.0–32.0)
MCHC: 34.3 g/dL (ref 31.0–35.5)
MCV: 94.8 fL (ref 78.0–100.0)
MONOCYTE #: 0.38 10*3/uL (ref 0.20–1.10)
MONOCYTE %: 7 %
MPV: 8.5 fL — ABNORMAL LOW (ref 8.7–12.5)
NEUTROPHIL #: 3.39 10*3/uL (ref 1.50–7.70)
NEUTROPHIL %: 60 %
PLATELETS: 216 10*3/uL (ref 150–400)
RBC: 4.4 10*6/uL (ref 3.85–5.22)
RDW-CV: 11.7 % (ref 11.5–15.5)
WBC: 5.6 10*3/uL (ref 3.7–11.0)

## 2022-02-13 LAB — LIPID PANEL
CHOL/HDL RATIO: 3.7
CHOLESTEROL: 136 mg/dL (ref 100–200)
HDL CHOL: 37 mg/dL — ABNORMAL LOW (ref >=50)
LDL CALC: 85 mg/dL (ref <100)
NON-HDL: 99 mg/dL (ref <=190)
TRIGLYCERIDES: 69 mg/dL (ref <150)
VLDL CALC: 11 mg/dL (ref <30)

## 2022-02-13 LAB — URINALYSIS, MICROSCOPIC

## 2022-02-13 LAB — HGA1C (HEMOGLOBIN A1C WITH EST AVG GLUCOSE)
ESTIMATED AVERAGE GLUCOSE: 91 mg/dL
HEMOGLOBIN A1C: 4.8 % (ref <=5.7)

## 2022-02-13 NOTE — Progress Notes (Signed)
FAMILY MEDICINE, [place] [facility]  [place] [id number]  Operated by [facility]    Progress Note     Name: Kristina Baxter MRN:  [id number]   Date: 02/12/2022 Age: 31 y.o.       Chief Complaint: Establish Care (Patient is here today to estabish primary care. She states that she was at [facility] on 10/18 and [facility] on 10/21 for high BP and heart rate. Patient states that she is on an anxiety medication but she is still having trouble sleeping. Kalen Ellifritz, MA/)    History of Present Illness:  Patient is seen today to establish care. PMH, medications and allergies are reviewed and confirmed with the patient. She is currently taking Wellbutrin and hydroxyzine for anxiety. She has not taken ativan in several weeks.     Patient was seen at [facility] and [facility] for tachycardia and chest pain. Both ED visits did not reveal a cardia reason for the tachycardia. Her cardiac work-up was benign. Patient states that the tachycardia is a new onset. She denies any history of cardiac, but states there is a family history. She denies irregular HR or palpitations. She did not experience shortness of breath or lightheadedness.    Patient does not appear anxious or panicked during visit. She is very calm. She does express worry about the recent and abrupt onset of the tachycardia. Her resting HR is 107. She states that her fitbit has shown HR up to 120. She continues to deny irregular HR.      Will order 7 day extended Holter monitor.     Patient states that she is not sleeping well at night. She does states that she is experiencing hypersomnolence during the day and dozes off or falls asleep easily. She does snore at night, but has not been told that she stops breathing. Will order a sleep study for the patient.     Will give patient metoprolol 25 mg daily. Would like for patient to wear Heart monitor before initiation of the medication.     Past Medical History  Past Medical History:   Diagnosis Date    Anxiety      Depression          Past Surgical History:   Procedure Laterality Date    DENTAL SURGERY      FOOT SURGERY Right          Current Outpatient Medications   Medication Sig    buPROPion (WELLBUTRIN SR) 150 mg Oral tablet sustained-release 12 hr Take 1 Tablet (150 mg total) by mouth Once a day Indications: anxiousness associated with depression    etonogestreL (NEXPLANON) 68 mg Subdermal Implant 68 mg by Subdermal route One time For subdermal insertion by health care providers trained in the insertion and removal procedure.    hydrOXYzine pamoate (VISTARIL) 25 mg Oral Capsule Take 1 Capsule (25 mg total) by mouth Three times a day as needed for Itching for up to 60 days Indications: anxious    metoprolol succinate (TOPROL-XL) 25 mg Oral Tablet Sustained Release 24 hr Take 1 Tablet (25 mg total) by mouth Once a day     Allergies   Allergen Reactions    Doxycycline Hives/ Urticaria    Fish Derived Nausea/ Vomiting    Klonopin [Clonazepam] Hives/ Urticaria    Minocycline Hives/ Urticaria     Social History     Tobacco Use   Smoking Status Every Day    Packs/day: .5  Types: Cigarettes   Smokeless Tobacco Never       Family History  Family Medical History:       Problem Relation (Age of Onset)    Alzheimer's/Dementia Maternal Grandmother    Cancer Maternal Grandfather    Diabetes Maternal Grandfather    Heart Attack Mother, Maternal Grandmother, Maternal Grandfather    Hypertension (High Blood Pressure) Mother, Maternal Grandmother, Maternal Grandfather    Lung disease Maternal Grandmother    No Known Problems Sister    Stroke Maternal Grandfather              Review of Systems  All pertinent Review of System as address in the HPI.    Review of Systems Reviewed    Imaging: ECG 12 LEAD    Result Date: 02/10/2022  Sinus tachycardia Otherwise normal ECG  Confirmed by Dyanne Iha ([id number]) on 02/10/2022 10:12:24 AM    XR EXAM CHEST 2 VIEWS    Result Date: 02/08/2022   [facility]  DIAGNOSTIC IMAGING REPORT  [id number], [place], [place] [address]   [phone]                                              Patient: Kristina Baxter  Ordering Physician: Tommie Sams, CRNP   MR#: [id number]  Referring Physician:   Acct#: [id number] Age: 31Sex:F DOB:Sep 20, 1990 Location: ED Status: DEP ER Procedure: XR,CHEST 2 VIEWS Service Date:02/08/22 Order#: [id number]  Report#: [id number]   Accession# [id number] Reason for Exam: chest pain, short of breath CHEST; TWO VIEWS INDICATION: Chest pain, shortness of breath. PROCEDURE: Two views of the chest. COMPARISON: None. FINDINGS: The lungs are clear. The cardiac silhouette, mediastinal structures, and pleural spaces are normal.  IMPRESSION: 1. No radiographic evidence of active cardiopulmonary disease. Dictated [id number], Sharion Dove MD  Dictated Date/Time: 02/08/22 1507 Transcribed by:[id number]               Trans Date/Time: 02/08/22 1508 Electronically Signed [id number], Sharion Dove MD  Signed Date/Time: 02/08/22 2353                                           Signed Copies to: Leim Fabry MD; Gwendolyn Fill, JASON CRNP                                                                                 PERMANENT RECORD      LABS REVIEWED  CBC  Diff   Lab Results   Component Value Date/Time    WBC 5.6 02/13/2022 07:41 AM    HGB 14.3 02/13/2022 07:41 AM    HCT 41.7 02/13/2022 07:41 AM    PLTCNT 216 02/13/2022 07:41 AM    RBC 4.40 02/13/2022 07:41 AM    MCV 94.8 02/13/2022 07:41 AM    MCHC 34.3 02/13/2022 07:41 AM    MCH 32.5 (H) 02/13/2022 07:41 AM    MPV 8.5 (L) 02/13/2022 07:41 AM    Lab Results  Component Value Date/Time    PMNS 60 02/13/2022 07:41 AM    MONOCYTES 7 02/13/2022 07:41 AM    BASOPHILS 1 02/13/2022 07:41 AM    BASOPHILS 0.04 02/13/2022 07:41 AM    PMNABS 3.39 02/13/2022 07:41 AM    LYMPHSABS 1.62 02/13/2022 07:41 AM    EOSABS 0.18 02/13/2022 07:41 AM    MONOSABS 0.38 02/13/2022 07:41 AM          COMPREHENSIVE METABOLIC PANEL   Lab Results   Component Value Date    SODIUM 139 02/13/2022     POTASSIUM 4.2 02/13/2022    CHLORIDE 109 02/13/2022    CO2 25 02/13/2022    ANIONGAP 5 02/13/2022    BUN 13 02/13/2022    CREATININE 0.83 02/13/2022    GLUCOSENF 104 (H) 02/13/2022    CALCIUM 9.0 02/13/2022    ALBUMIN 3.6 02/13/2022    TOTALPROTEIN 6.9 02/13/2022    ALKPHOS 67 02/13/2022    AST 36 02/13/2022    ALT 58 (H) 02/13/2022           Lab Results   Component Value Date    CHOLESTEROL 136 02/13/2022    HDLCHOL 37 (L) 02/13/2022    LDLCHOL 85 02/13/2022    TRIG 69 02/13/2022        Admission on 02/05/2022, Discharged on 02/05/2022   Component Date Value Ref Range Status    Ventricular rate 02/05/2022 105  BPM Final    Atrial Rate 02/05/2022 105  BPM Final    PR Interval 02/05/2022 144  ms Final    QRS Duration 02/05/2022 74  ms Final    QT Interval 02/05/2022 318  ms Final    QTC Calculation 02/05/2022 420  ms Final    Calculated P Axis 02/05/2022 67  degrees Final    Calculated R Axis 02/05/2022 10  degrees Final    Calculated T Axis 02/05/2022 26  degrees Final    RAINBOW/EXTRA TUBE AUTO RESULT 02/05/2022 Yes   Final    RAINBOW/EXTRA TUBE AUTO RESULT 02/05/2022 Yes   Final    SODIUM 02/05/2022 139  136 - 145 mmol/L Final    POTASSIUM 02/05/2022 3.6  3.5 - 5.1 mmol/L Final    CHLORIDE 02/05/2022 106  96 - 111 mmol/L Final    CO2 TOTAL 02/05/2022 25  22 - 30 mmol/L Final    ANION GAP 02/05/2022 8  4 - 13 mmol/L Final    BUN 02/05/2022 10  8 - 25 mg/dL Final    CREATININE 02/05/2022 0.87  0.60 - 1.05 mg/dL Final    BUN/CREA RATIO 02/05/2022 11  6 - 22 Final    ALBUMIN 02/05/2022 4.1  3.5 - 5.0 g/dL  Final    CALCIUM 02/05/2022 9.2  8.6 - 10.2 mg/dL Final    Gadolinium-containing contrast can interfere with calcium measurement.    GLUCOSE 02/05/2022 98  65 - 125 mg/dL Final    ALKALINE PHOSPHATASE 02/05/2022 82  40 - 110 U/L Final    ALT (SGPT) 02/05/2022 41 (H)  8 - 22 U/L Final    AST (SGOT)  02/05/2022 18  8 - 45 U/L Final    BILIRUBIN TOTAL 02/05/2022 0.5  0.3 - 1.3 mg/dL Final    Naproxen therapy can  falsely elevate total bilirubin levels.    PROTEIN TOTAL 02/05/2022 7.6  6.4 - 8.3 g/dL Final    ESTIMATED GFR - FEMALE 02/05/2022 90  >=60 mL/min/BSA Final    Estimated Glomerular Filtration Rate (eGFR)  is calculated using the gender-dependent CKD-EPI (2021) equation, intended for patients 40 years of age and older.    If patient sex is not documented, or if patient sex and gender are incongruent, eGFR results calculated for both female and female will be reported. Recommend correlation and careful interpretation of patient history, keeping in mind that serum creatinine is influenced by hormone therapy.    Stage, GFR, Classification   G1, 90, Normal or High   G2, 60-89, Mildly decreased   G3a, 45-59, Mildly to moderately decreased   G3b, 30-44, Moderately to severely decreased   G4, 15-29, Severely decreased   G5, <15, Kidney failure   In the absence of kidney damage, neither G1 or G2 fulfill criteria for CKD per KDIGO.      MAGNESIUM 02/05/2022 2.0  1.8 - 2.6 mg/dL Final    Magnesium is increased during menses. Magnesium therapy for pre-eclampsia yields elevated results, but typically levels are <7 mg/dL.    PREGNANCY, SERUM QUALITATIVE 02/05/2022 Negative  Negative Final    WBC 02/05/2022 9.0  3.7 - 11.0 x10^3/uL Final    RBC 02/05/2022 4.93  3.85 - 5.22 x10^6/uL Final    HGB 02/05/2022 15.6  11.5 - 16.0 g/dL Final    HCT 02/05/2022 45.6  34.8 - 46.0 % Final    MCV 02/05/2022 92.5  78.0 - 100.0 fL Final    MCH 02/05/2022 31.6  26.0 - 32.0 pg Final    MCHC 02/05/2022 34.2  31.0 - 35.5 g/dL Final    RDW-CV 02/05/2022 11.2 (L)  11.5 - 15.5 % Final    PLATELETS 02/05/2022 301  150 - 400 x10^3/uL Final    MPV 02/05/2022 8.4 (L)  8.7 - 12.5 fL Final    NEUTROPHIL % 02/05/2022 51  % Final    LYMPHOCYTE % 02/05/2022 38  % Final    MONOCYTE % 02/05/2022 8  % Final    EOSINOPHIL % 02/05/2022 3  % Final    BASOPHIL % 02/05/2022 0  % Final    NEUTROPHIL # 02/05/2022 4.49  1.50 - 7.70 x10^3/uL Final    LYMPHOCYTE # 02/05/2022  3.42  1.00 - 4.80 x10^3/uL Final    MONOCYTE # 02/05/2022 0.72  0.20 - 1.10 x10^3/uL Final    EOSINOPHIL # 02/05/2022 0.26  <=0.50 x10^3/uL Final    BASOPHIL # 02/05/2022 0.04  <=0.20 x10^3/uL Final    IMMATURE GRANULOCYTE % 02/05/2022 0  0 - 1 % Final    The immature granulocyte fraction (IGF) quantifies total circulating myelocytes, metamyelocytes, and promyelocytes. It is used to evaluate immune responses to infection, inflammation, or other stimuli of the bone marrow. Caution is advised in interpreting test results in neonates who normally have greater numbers of circulating immature blood cells.      IMMATURE GRANULOCYTE # 02/05/2022 <0.04  <0.10 x10^3/uL Final    TSH 02/05/2022 3.311  0.350 - 4.940 uIU/mL Final    Pregnant patients have trimester-specific ranges derived from this analyzer/technology. For singleton pregnancies:    First trimester, 0.15 - 2.45 uIU/mL  Second trimester, 0.35 - 3.20 uIU/mL  Third trimester, 0.40 - 4.30 uIU/mL    D-DIMER 02/05/2022 <200  <200 ng/mL DDU Final       The ASCVD Risk score (Arnett DK, et al., 2019) failed to calculate for the following reasons:    The 2019 ASCVD risk score is only valid for ages 82 to 23    Vitals:    02/12/22 1315   BP: 120/80  Pulse: (!) 107   Temp: 37.1 C (98.7 F)   TempSrc: Tympanic   SpO2: 97%   Weight: 117 kg (258 lb 6.4 oz)   Height: 1.588 m (5' 2.5")   BMI: 46.61       Physical Examination:  Physical Exam  Vitals and nursing note reviewed.   Constitutional:       General: She is not in acute distress.     Appearance: Normal appearance. She is obese. She is not ill-appearing.   Cardiovascular:      Rate and Rhythm: Regular rhythm. Tachycardia present.      Pulses: Normal pulses.      Heart sounds: Normal heart sounds. No murmur heard.     No friction rub. No gallop.   Pulmonary:      Effort: Pulmonary effort is normal.      Breath sounds: Normal breath sounds. No wheezing, rhonchi or rales.   Neurological:      Mental Status: She is alert.           Procedures         Assessment and Plan  ENCOUNTER DIAGNOSES     ICD-10-CM   1. Encounter to establish care  Z76.89   2. Tachycardia  R00.0   3. Hypersomnolence  G47.10   4. Hypertension, unspecified type  I10   5. Anxiety  F41.9     Orders Placed This Encounter    CBC/DIFF    COMPREHENSIVE METABOLIC PNL, FASTING    HBZ1I (HEMOGLOBIN A1C WITH EST AVG GLUCOSE)    LIPID PANEL    URINALYSIS, MACROSCOPIC AND MICROSCOPIC W/CULTURE REFLEX    CANCELED: 7 DAY EXTENDED HOLTER MONITOR    7 DAY EXTENDED HOLTER MONITOR    TRANSTHORACIC ECHOCARDIOGRAM - ADULT    POLYSOMNOGRAPHY - SLEEP STUDY - UNATTENDED    metoprolol succinate (TOPROL-XL) 25 mg Oral Tablet Sustained Release 24 hr    buPROPion (WELLBUTRIN SR) 150 mg Oral tablet sustained-release 12 hr    hydrOXYzine pamoate (VISTARIL) 25 mg Oral Capsule         (Z76.89) Encounter to establish care  (primary encounter diagnosis)  CBC/DIFF, COMPREHENSIVE METABOLIC PNL, FASTING,        HGA1C (HEMOGLOBIN A1C WITH EST AVG GLUCOSE),         LIPID PANEL, URINALYSIS, MACROSCOPIC AND         MICROSCOPIC W/CULTURE REFLEX            (R00.0) Tachycardia  metoprolol succinate (TOPROL-XL) 25 mg Oral         Tablet Sustained Release 24 hr,   TRANSTHORACIC   ECHOCARDIOGRAM - ADULT, 7 DAY EXTENDED HOLTER         MONITOR,       (G47.10) Hypersomnolence  POLYSOMNOGRAPHY - SLEEP STUDY - UNATTENDED            (I10) Hypertension, unspecified type  TRANSTHORACIC ECHOCARDIOGRAM - ADULT            (F41.9) Anxiety  buPROPion (WELLBUTRIN SR) 150 mg Oral tablet         sustained-release 12 hr,   hydrOXYzine pamoate         (VISTARIL) 25 mg Oral Capsule              The patient was given the opportunity to ask questions and those questions were answered to the patient's satisfaction. The patient was encouraged to call with any additional questions or concerns.     There are no Patient Instructions  on file for this visit.    Return in about 2 weeks (around 02/26/2022) for Routine follow-up with  labs.    Deborra Medina, FNP-BC      This note may have been partially generated using MModal Fluency Direct system, and there may be some incorrect words, spellings, and punctuation that were not noted in checking the note before saving.

## 2022-02-14 LAB — URINE CULTURE,ROUTINE: URINE CULTURE: 35000

## 2022-02-18 ENCOUNTER — Other Ambulatory Visit: Payer: Self-pay

## 2022-02-18 ENCOUNTER — Inpatient Hospital Stay
Admission: RE | Admit: 2022-02-18 | Discharge: 2022-02-18 | Disposition: A | Discharge status: Home / Self Care | Payer: PPO | Source: Ambulatory Visit | Attending: Family Medicine | Admitting: Family Medicine

## 2022-02-18 ENCOUNTER — Telehealth (HOSPITAL_BASED_OUTPATIENT_CLINIC_OR_DEPARTMENT_OTHER): Payer: Self-pay | Admitting: Family Medicine

## 2022-02-18 DIAGNOSIS — R000 Tachycardia, unspecified: Secondary | ICD-10-CM | POA: Insufficient documentation

## 2022-02-18 NOTE — Telephone Encounter (Signed)
Pt would like a call back regarding her recent labs.  She states that she saw on MyChart that she "has some yeast" and would like to know if anything will be called in for that.      Please call to advise.     Thanks!

## 2022-02-19 ENCOUNTER — Other Ambulatory Visit (HOSPITAL_BASED_OUTPATIENT_CLINIC_OR_DEPARTMENT_OTHER): Payer: Self-pay | Admitting: Family Medicine

## 2022-02-19 DIAGNOSIS — B3749 Other urogenital candidiasis: Secondary | ICD-10-CM

## 2022-02-19 MED ORDER — FLUCONAZOLE 200 MG TABLET
200.0000 mg | ORAL_TABLET | Freq: Every day | ORAL | 0 refills | Status: DC
Start: 2022-02-19 — End: 2022-03-26

## 2022-02-19 NOTE — Telephone Encounter (Signed)
Returned pt's call regarding lab results and medication request. No answer. Left message to return call.

## 2022-02-20 NOTE — Telephone Encounter (Signed)
2nd attempt to contact pt regarding lab results and medication request. No answer. LVM to return call.

## 2022-02-21 NOTE — Telephone Encounter (Signed)
Pt returned call regarding lab results and medication request. Informed pt that the provider has called in [place] for her due to the fact that her labs showed some yeast. The pt stated that she had received a notification from her pharmacy and had already picked up the Rx.

## 2022-02-21 NOTE — Telephone Encounter (Signed)
3rd attempt to contact patient regarding lab results and medication request. No answer. LVM to return call.

## 2022-02-25 ENCOUNTER — Inpatient Hospital Stay
Admission: RE | Admit: 2022-02-25 | Discharge: 2022-02-25 | Disposition: A | Discharge status: Home / Self Care | Payer: PPO | Source: Ambulatory Visit | Attending: Family Medicine | Admitting: Family Medicine

## 2022-02-25 ENCOUNTER — Other Ambulatory Visit: Payer: Self-pay

## 2022-02-25 DIAGNOSIS — G4710 Hypersomnia, unspecified: Secondary | ICD-10-CM | POA: Insufficient documentation

## 2022-02-26 ENCOUNTER — Encounter (HOSPITAL_BASED_OUTPATIENT_CLINIC_OR_DEPARTMENT_OTHER): Payer: Self-pay | Admitting: Family Medicine

## 2022-02-26 ENCOUNTER — Ambulatory Visit: Payer: PPO | Attending: Family Medicine | Admitting: Family Medicine

## 2022-02-26 ENCOUNTER — Other Ambulatory Visit: Payer: Self-pay

## 2022-02-26 VITALS — BP 132/82 | HR 92 | Temp 98.2°F | Ht 62.5 in | Wt 249.2 lb

## 2022-02-26 DIAGNOSIS — K219 Gastro-esophageal reflux disease without esophagitis: Secondary | ICD-10-CM | POA: Insufficient documentation

## 2022-02-26 DIAGNOSIS — G4709 Other insomnia: Secondary | ICD-10-CM | POA: Insufficient documentation

## 2022-02-26 DIAGNOSIS — R000 Tachycardia, unspecified: Secondary | ICD-10-CM | POA: Insufficient documentation

## 2022-02-26 DIAGNOSIS — Z79899 Other long term (current) drug therapy: Secondary | ICD-10-CM | POA: Insufficient documentation

## 2022-02-26 DIAGNOSIS — R079 Chest pain, unspecified: Secondary | ICD-10-CM | POA: Insufficient documentation

## 2022-02-26 MED ORDER — PANTOPRAZOLE 20 MG TABLET,DELAYED RELEASE
20.0000 mg | DELAYED_RELEASE_TABLET | Freq: Every morning | ORAL | 0 refills | Status: DC
Start: 2022-02-26 — End: 2022-03-26

## 2022-02-26 MED ORDER — DOXEPIN 3 MG TABLET
3.0000 mg | ORAL_TABLET | Freq: Every evening | ORAL | 0 refills | Status: DC
Start: 2022-02-26 — End: 2022-03-03

## 2022-02-26 NOTE — Progress Notes (Signed)
FAM[facility], [facility] [place] [facility]  [place] [id number]  Operated by [facility]    Progress Note     Name: Kristina Baxter MRN:  [id number]   Date: 02/26/2022 Age: 31 y.o.       Chief Complaint: Follow Up (Pt here today for 2 wk follow up with lab results. She is still having trouble sleeping and is only getting 2-3 hrs a night. Pt states she still has racing heart. She started the metoprolol today. Kalen Ellifritz, MA/)    History of Present Illness:  Patient presents today for follow-up from medications changes.     Patient states that she continues to experience tachycardia that is more prominent with position changes, especially sitting to standing. She is also experiencing chest pain that is occurring after she lies down at night. She recently had a 7 day Holter monitor performed and the results are pending. She states that she continues to experience tachycardia which worsens when she moves from sitting to standing. She started metoprolol this am. HR is office is 92. EKG was repeated in office and is normal with NSR. Patient will have echo performed on 03/25/22. Will refer patient to cardiology for further evaluation.     Patient states that she is only sleeping 2-3 hours per night. The hydroxyzine did not help to induce sleep. She is experiencing pronounced hypersomnolence and has fallen asleep at work several times. She recently had a sleep study performed and results are pending. Will start patient on doxepin 3 mg nightly.     In further regard to the chest pain, will start patient on pantoprazole 20 mg daily.  Administration and use of PPI were reviewed and instructed on GI precautions.     Will follow-up with patient in the clinic in 4 weeks.     Past Medical History  Past Medical History:   Diagnosis Date    Anxiety     Depression          Past Surgical History:   Procedure Laterality Date    DENTAL SURGERY      FOOT SURGERY Right          Current Outpatient Medications    Medication Sig    buPROPion (WELLBUTRIN SR) 150 mg Oral tablet sustained-release 12 hr Take 1 Tablet (150 mg total) by mouth Once a day Indications: anxiousness associated with depression    Doxepin 3 mg Oral Tablet Take 1 Tablet (3 mg total) by mouth Every night for 30 days Indications: difficulty sleeping    etonogestreL (NEXPLANON) 68 mg Subdermal Implant 68 mg by Subdermal route One time For subdermal insertion by health care providers trained in the insertion and removal procedure.    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    hydrOXYzine pamoate (VISTARIL) 25 mg Oral Capsule Take 1 Capsule (25 mg total) by mouth Three times a day as needed for Itching for up to 60 days Indications: anxious    metoprolol succinate (TOPROL-XL) 25 mg Oral Tablet Sustained Release 24 hr Take 1 Tablet (25 mg total) by mouth Once a day    pantoprazole (PROTONIX) 20 mg Oral Tablet, Delayed Release (E.C.) Take 1 Tablet (20 mg total) by mouth Every morning before breakfast for 30 days Indications: gastroesophageal reflux disease     Allergies   Allergen Reactions  Doxycycline Hives/ Urticaria    Fish Derived Nausea/ Vomiting    Klonopin [Clonazepam] Hives/ Urticaria    Minocycline Hives/ Urticaria     Social History     Tobacco Use   Smoking Status Every Day    Packs/day: .5    Types: Cigarettes   Smokeless Tobacco Never       Family History  Family Medical History:       Problem Relation (Age of Onset)    Alzheimer's/Dementia Maternal Grandmother    Cancer Maternal Grandfather    Diabetes Maternal Grandfather    Heart Attack Mother, Maternal Grandmother, Maternal Grandfather    Hypertension (High Blood Pressure) Mother, Maternal Grandmother, Maternal Grandfather    Lung disease Maternal Grandmother    No Known Problems Sister    Stroke Maternal Grandfather              Review of Systems  All pertinent  Review of System as address in the HPI.    Review of Systems Reviewed    Imaging: ECG 12 LEAD    Result Date: 02/10/2022  Sinus tachycardia Otherwise normal ECG  Confirmed by Dyanne Iha ([id number]) on 02/10/2022 10:12:24 AM    XR EXAM CHEST 2 VIEWS    Result Date: 02/08/2022   [facility]  DIAGNOSTIC IMAGING REPORT  [address], [place], [place] [address]   [phone]                                              Patient: Kristina Baxter  Ordering Physician: Tommie Sams, CRNP   MR#: [id number]  Referring Physician:   Acct#: [id number] Age: 31Sex:F DOB:02/04/1991 Location: ED Status: DEP ER Procedure: XR,CHEST 2 VIEWS Service Date:02/08/22 Order#: [id number]  Report#: [id number]   Accession# [id number] Reason for Exam: chest pain, short of breath CHEST; TWO VIEWS INDICATION: Chest pain, shortness of breath. PROCEDURE: Two views of the chest. COMPARISON: None. FINDINGS: The lungs are clear. The cardiac silhouette, mediastinal structures, and pleural spaces are normal.  IMPRESSION: 1. No radiographic evidence of active cardiopulmonary disease. Dictated KZ:[email or web address], Sharion Dove MD  Dictated Date/Time: 02/08/22 1507 Transcribed by:[id number]               Trans Date/Time: 02/08/22 1508 Electronically Signed FT:[id number], Sharion Dove MD  Signed Date/Time: 02/08/22 2353                                           Signed Copies to: Leim Fabry MD; Gwendolyn Fill, JASON CRNP                                                                                 PERMANENT RECORD      LABS REVIEWED  CBC  Diff   Lab Results   Component Value Date/Time    WBC 5.6 02/13/2022 07:41 AM    HGB 14.3 02/13/2022 07:41 AM    HCT 41.7 02/13/2022 07:41 AM    PLTCNT 216 02/13/2022 07:41  AM    RBC 4.40 02/13/2022 07:41 AM    MCV 94.8 02/13/2022 07:41 AM    MCHC 34.3 02/13/2022 07:41 AM    MCH 32.5 (H) 02/13/2022 07:41 AM    MPV 8.5 (L) 02/13/2022 07:41 AM    Lab Results   Component Value Date/Time    PMNS 60 02/13/2022 07:41 AM    MONOCYTES 7  02/13/2022 07:41 AM    BASOPHILS 1 02/13/2022 07:41 AM    BASOPHILS 0.04 02/13/2022 07:41 AM    PMNABS 3.39 02/13/2022 07:41 AM    LYMPHSABS 1.62 02/13/2022 07:41 AM    EOSABS 0.18 02/13/2022 07:41 AM    MONOSABS 0.38 02/13/2022 07:41 AM          COMPREHENSIVE METABOLIC PANEL   Lab Results   Component Value Date    SODIUM 139 02/13/2022    POTASSIUM 4.2 02/13/2022    CHLORIDE 109 02/13/2022    CO2 25 02/13/2022    ANIONGAP 5 02/13/2022    BUN 13 02/13/2022    CREATININE 0.83 02/13/2022    GLUCOSENF 104 (H) 02/13/2022    CALCIUM 9.0 02/13/2022    ALBUMIN 3.6 02/13/2022    TOTALPROTEIN 6.9 02/13/2022    ALKPHOS 67 02/13/2022    AST 36 02/13/2022    ALT 58 (H) 02/13/2022           Lab Results   Component Value Date    CHOLESTEROL 136 02/13/2022    HDLCHOL 37 (L) 02/13/2022    LDLCHOL 85 02/13/2022    TRIG 69 02/13/2022        Appointment on 02/13/2022   Component Date Value Ref Range Status    SODIUM 02/13/2022 139  136 - 145 mmol/L Final    POTASSIUM 02/13/2022 4.2  3.5 - 5.1 mmol/L Final    CHLORIDE 02/13/2022 109  96 - 111 mmol/L Final    CO2 TOTAL 02/13/2022 25  22 - 30 mmol/L Final    ANION GAP 02/13/2022 5  4 - 13 mmol/L Final    BUN 02/13/2022 13  8 - 25 mg/dL Final    CREATININE 02/13/2022 0.83  0.60 - 1.05 mg/dL Final    BUN/CREA RATIO 02/13/2022 16  6 - 22 Final    ALBUMIN 02/13/2022 3.6  3.5 - 5.0 g/dL  Final    CALCIUM 02/13/2022 9.0  8.6 - 10.2 mg/dL Final    Gadolinium-containing contrast can interfere with calcium measurement.    GLUCOSE 02/13/2022 104 (H)  70 - 99 mg/dL Final    ALKALINE PHOSPHATASE 02/13/2022 67  40 - 110 U/L Final    ALT (SGPT) 02/13/2022 58 (H)  8 - 22 U/L Final    AST (SGOT)  02/13/2022 36  8 - 45 U/L Final    BILIRUBIN TOTAL 02/13/2022 0.6  0.3 - 1.3 mg/dL Final    Naproxen therapy can falsely elevate total bilirubin levels.    PROTEIN TOTAL 02/13/2022 6.9  6.4 - 8.3 g/dL Final    ESTIMATED GFR - FEMALE 02/13/2022 >90  >=60 mL/min/BSA Final    Estimated Glomerular Filtration Rate  (eGFR) is calculated using the gender-dependent CKD-EPI (2021) equation, intended for patients 84 years of age and older.    If patient sex is not documented, or if patient sex and gender are incongruent, eGFR results calculated for both female and female will be reported. Recommend correlation and careful interpretation of patient history, keeping in mind that serum creatinine is influenced by hormone therapy.    Stage, GFR,  Classification   G1, 90, Normal or High   G2, 60-89, Mildly decreased   G3a, 45-59, Mildly to moderately decreased   G3b, 30-44, Moderately to severely decreased   G4, 15-29, Severely decreased   G5, <15, Kidney failure   In the absence of kidney damage, neither G1 or G2 fulfill criteria for CKD per KDIGO.      HEMOGLOBIN A1C 02/13/2022 4.8  <=5.7 % Final    ESTIMATED AVERAGE GLUCOSE 02/13/2022 91  mg/dL Final    CHOLESTEROL  02/13/2022 136  100 - 200 mg/dL Final    HDL CHOL 02/13/2022 37 (L)  >=50 mg/dL Final    TRIGLYCERIDES 02/13/2022 69  <150 mg/dL Final    LDL CALC 02/13/2022 85  <100 mg/dL Final    <100 mg/dL, Optimal  100-129 mg/dL, Near/Above Optimal  130-159 mg/dL, Borderline High  160-189 mg/dL, High  >=190 mg/dL, Very high    VLDL CALC 02/13/2022 11  <30 mg/dL Final    NON-HDL 02/13/2022 99  <=190 mg/dL Final    CHOL/HDL RATIO 02/13/2022 3.7   Final    WBC 02/13/2022 5.6  3.7 - 11.0 x10^3/uL Final    RBC 02/13/2022 4.40  3.85 - 5.22 x10^6/uL Final    HGB 02/13/2022 14.3  11.5 - 16.0 g/dL Final    HCT 02/13/2022 41.7  34.8 - 46.0 % Final    MCV 02/13/2022 94.8  78.0 - 100.0 fL Final    MCH 02/13/2022 32.5 (H)  26.0 - 32.0 pg Final    MCHC 02/13/2022 34.3  31.0 - 35.5 g/dL Final    RDW-CV 02/13/2022 11.7  11.5 - 15.5 % Final    PLATELETS 02/13/2022 216  150 - 400 x10^3/uL Final    MPV 02/13/2022 8.5 (L)  8.7 - 12.5 fL Final    NEUTROPHIL % 02/13/2022 60  % Final    LYMPHOCYTE % 02/13/2022 29  % Final    MONOCYTE % 02/13/2022 7  % Final    EOSINOPHIL % 02/13/2022 3  % Final    BASOPHIL %  02/13/2022 1  % Final    NEUTROPHIL # 02/13/2022 3.39  1.50 - 7.70 x10^3/uL Final    LYMPHOCYTE # 02/13/2022 1.62  1.00 - 4.80 x10^3/uL Final    MONOCYTE # 02/13/2022 0.38  0.20 - 1.10 x10^3/uL Final    EOSINOPHIL # 02/13/2022 0.18  <=0.50 x10^3/uL Final    BASOPHIL # 02/13/2022 0.04  <=0.20 x10^3/uL Final    IMMATURE GRANULOCYTE % 02/13/2022 0  0 - 1 % Final    The immature granulocyte fraction (IGF) quantifies total circulating myelocytes, metamyelocytes, and promyelocytes. It is used to evaluate immune responses to infection, inflammation, or other stimuli of the bone marrow. Caution is advised in interpreting test results in neonates who normally have greater numbers of circulating immature blood cells.      IMMATURE GRANULOCYTE # 02/13/2022 <0.04  <0.10 x10^3/uL Final    COLOR 02/13/2022 Yellow   Final    APPEARANCE 02/13/2022 Clear   Final    SPECIFIC GRAVITY 02/13/2022 1.020  1.005 - 1.030 Final    PH 02/13/2022 8.0  5.0 - 8.0 Final    LEUKOCYTES 02/13/2022 Negative  Negative WBCs/uL Final    NITRITE 02/13/2022 Negative  Negative Final    PROTEIN 02/13/2022 Negative  Negative mg/dL Final    GLUCOSE 02/13/2022 Negative  Negative mg/dL Final    KETONES 02/13/2022 Negative  Negative mg/dL Final    UROBILINOGEN 02/13/2022 0.2  0.2 - 1.0 mg/dL  Final    BILIRUBIN 02/13/2022 Negative  Negative mg/dL Final    BLOOD 02/13/2022 Negative  Negative mg/dL Final    RBCS 02/13/2022 0-2  Occasional or less, 0-2, 1-3, None /hpf Final    WBCS 02/13/2022 1-3  Occasional or less, 0-2, 1-3, None /hpf Final    BACTERIA 02/13/2022 1+ (A)  None /hpf Final    SQUAMOUS EPITHELIAL 02/13/2022 Many  /hpf Final    AMORPHOUS SEDIMENT 02/13/2022 Trace  /hpf Final    YEAST 02/13/2022 Present  /hpf Final    URINE CULTURE 02/13/2022 35,000 CFU/mL Mixed Commensal Flora   Final   Admission on 02/05/2022, Discharged on 02/05/2022   Component Date Value Ref Range Status    Ventricular rate 02/05/2022 105  BPM Final    Atrial Rate 02/05/2022 105   BPM Final    PR Interval 02/05/2022 144  ms Final    QRS Duration 02/05/2022 74  ms Final    QT Interval 02/05/2022 318  ms Final    QTC Calculation 02/05/2022 420  ms Final    Calculated P Axis 02/05/2022 67  degrees Final    Calculated R Axis 02/05/2022 10  degrees Final    Calculated T Axis 02/05/2022 26  degrees Final    RAINBOW/EXTRA TUBE AUTO RESULT 02/05/2022 Yes   Final    RAINBOW/EXTRA TUBE AUTO RESULT 02/05/2022 Yes   Final    SODIUM 02/05/2022 139  136 - 145 mmol/L Final    POTASSIUM 02/05/2022 3.6  3.5 - 5.1 mmol/L Final    CHLORIDE 02/05/2022 106  96 - 111 mmol/L Final    CO2 TOTAL 02/05/2022 25  22 - 30 mmol/L Final    ANION GAP 02/05/2022 8  4 - 13 mmol/L Final    BUN 02/05/2022 10  8 - 25 mg/dL Final    CREATININE 02/05/2022 0.87  0.60 - 1.05 mg/dL Final    BUN/CREA RATIO 02/05/2022 11  6 - 22 Final    ALBUMIN 02/05/2022 4.1  3.5 - 5.0 g/dL  Final    CALCIUM 02/05/2022 9.2  8.6 - 10.2 mg/dL Final    Gadolinium-containing contrast can interfere with calcium measurement.    GLUCOSE 02/05/2022 98  65 - 125 mg/dL Final    ALKALINE PHOSPHATASE 02/05/2022 82  40 - 110 U/L Final    ALT (SGPT) 02/05/2022 41 (H)  8 - 22 U/L Final    AST (SGOT)  02/05/2022 18  8 - 45 U/L Final    BILIRUBIN TOTAL 02/05/2022 0.5  0.3 - 1.3 mg/dL Final    Naproxen therapy can falsely elevate total bilirubin levels.    PROTEIN TOTAL 02/05/2022 7.6  6.4 - 8.3 g/dL Final    ESTIMATED GFR - FEMALE 02/05/2022 90  >=60 mL/min/BSA Final    Estimated Glomerular Filtration Rate (eGFR) is calculated using the gender-dependent CKD-EPI (2021) equation, intended for patients 45 years of age and older.    If patient sex is not documented, or if patient sex and gender are incongruent, eGFR results calculated for both female and female will be reported. Recommend correlation and careful interpretation of patient history, keeping in mind that serum creatinine is influenced by hormone therapy.    Stage, GFR, Classification   G1, 90, Normal or High    G2, 60-89, Mildly decreased   G3a, 45-59, Mildly to moderately decreased   G3b, 30-44, Moderately to severely decreased   G4, 15-29, Severely decreased   G5, <15, Kidney failure   In the absence  of kidney damage, neither G1 or G2 fulfill criteria for CKD per KDIGO.      MAGNESIUM 02/05/2022 2.0  1.8 - 2.6 mg/dL Final    Magnesium is increased during menses. Magnesium therapy for pre-eclampsia yields elevated results, but typically levels are <7 mg/dL.    PREGNANCY, SERUM QUALITATIVE 02/05/2022 Negative  Negative Final    WBC 02/05/2022 9.0  3.7 - 11.0 x10^3/uL Final    RBC 02/05/2022 4.93  3.85 - 5.22 x10^6/uL Final    HGB 02/05/2022 15.6  11.5 - 16.0 g/dL Final    HCT 02/05/2022 45.6  34.8 - 46.0 % Final    MCV 02/05/2022 92.5  78.0 - 100.0 fL Final    MCH 02/05/2022 31.6  26.0 - 32.0 pg Final    MCHC 02/05/2022 34.2  31.0 - 35.5 g/dL Final    RDW-CV 02/05/2022 11.2 (L)  11.5 - 15.5 % Final    PLATELETS 02/05/2022 301  150 - 400 x10^3/uL Final    MPV 02/05/2022 8.4 (L)  8.7 - 12.5 fL Final    NEUTROPHIL % 02/05/2022 51  % Final    LYMPHOCYTE % 02/05/2022 38  % Final    MONOCYTE % 02/05/2022 8  % Final    EOSINOPHIL % 02/05/2022 3  % Final    BASOPHIL % 02/05/2022 0  % Final    NEUTROPHIL # 02/05/2022 4.49  1.50 - 7.70 x10^3/uL Final    LYMPHOCYTE # 02/05/2022 3.42  1.00 - 4.80 x10^3/uL Final    MONOCYTE # 02/05/2022 0.72  0.20 - 1.10 x10^3/uL Final    EOSINOPHIL # 02/05/2022 0.26  <=0.50 x10^3/uL Final    BASOPHIL # 02/05/2022 0.04  <=0.20 x10^3/uL Final    IMMATURE GRANULOCYTE % 02/05/2022 0  0 - 1 % Final    The immature granulocyte fraction (IGF) quantifies total circulating myelocytes, metamyelocytes, and promyelocytes. It is used to evaluate immune responses to infection, inflammation, or other stimuli of the bone marrow. Caution is advised in interpreting test results in neonates who normally have greater numbers of circulating immature blood cells.      IMMATURE GRANULOCYTE # 02/05/2022 <0.04  <0.10  x10^3/uL Final    TSH 02/05/2022 3.311  0.350 - 4.940 uIU/mL Final    Pregnant patients have trimester-specific ranges derived from this analyzer/technology. For singleton pregnancies:    First trimester, 0.15 - 2.45 uIU/mL  Second trimester, 0.35 - 3.20 uIU/mL  Third trimester, 0.40 - 4.30 uIU/mL    D-DIMER 02/05/2022 <200  <200 ng/mL DDU Final       The ASCVD Risk score (Arnett DK, et al., 2019) failed to calculate for the following reasons:    The 2019 ASCVD risk score is only valid for ages 78 to 63    Vitals:    02/26/22 1546   BP: 132/82   Pulse: 92   Temp: 36.8 C (98.2 F)   TempSrc: Tympanic   SpO2: 100%   Weight: 113 kg (249 lb 3.2 oz)   Height: 1.588 m (5' 2.5")   BMI: 44.95       Physical Examination:  Physical Exam  Vitals and nursing note reviewed.   Constitutional:       Appearance: Normal appearance. She is obese.   Cardiovascular:      Rate and Rhythm: Normal rate and regular rhythm.      Pulses: Normal pulses.      Heart sounds: Normal heart sounds. No murmur heard.     No friction rub. No  gallop.   Pulmonary:      Effort: Pulmonary effort is normal.      Breath sounds: Normal breath sounds. No wheezing, rhonchi or rales.   Neurological:      Mental Status: She is alert.          Procedures         Assessment and Plan  ENCOUNTER DIAGNOSES     ICD-10-CM   1. Tachycardia  R00.0   2. Chest pain, unspecified type  R07.9   3. 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 Gastroesophageal reflux disease without esophagitis  K21.9     Orders Placed This Encounter    Refer to [facility]    ECG (In Clinic, Same Day)    Doxepin 3 mg Oral Tablet    pantoprazole (PROTONIX) 20 mg Oral Tablet, Delayed Release (E.C.)         (R00.0) Tachycardia  (primary encounter diagnosis)  Refer to [facility]            (R07.9) Chest pain, unspecified type  ECG (In Clinic, Same Day)            (G47.09) Other insomnia  Doxepin 3 mg Oral Tablet, nightly            (K21.9) Gastroesophageal reflux  disease without esophagitis  pantoprazole (PROTONIX) 20 mg Oral Tablet,         Delayed Release (E.C.)  Observe GI precautions      The patient was given the opportunity to ask questions and those questions were answered to the patient's satisfaction. The patient was encouraged to call with any additional questions or concerns.     There are no Patient Instructions on file for this visit.    Return in about 4 weeks (around 03/26/2022) for Follow-up after testing.    Deborra Medina, FNP-BC      This note may have been partially generated using MModal Fluency Direct system, and there may be some incorrect words, spellings, and punctuation that were not noted in checking the note before saving.

## 2022-02-27 DIAGNOSIS — G4709 Other insomnia: Secondary | ICD-10-CM | POA: Insufficient documentation

## 2022-02-27 DIAGNOSIS — R000 Tachycardia, unspecified: Secondary | ICD-10-CM | POA: Insufficient documentation

## 2022-02-27 DIAGNOSIS — K219 Gastro-esophageal reflux disease without esophagitis: Secondary | ICD-10-CM | POA: Insufficient documentation

## 2022-02-27 DIAGNOSIS — R079 Chest pain, unspecified: Secondary | ICD-10-CM | POA: Insufficient documentation

## 2022-02-27 LAB — ECG 12 LEAD
Atrial Rate: 90 {beats}/min
Calculated P Axis: 64 degrees
Calculated R Axis: 9 degrees
Calculated T Axis: 37 degrees
PR Interval: 162 ms
QRS Duration: 72 ms
QT Interval: 338 ms
QTC Calculation: 413 ms
Ventricular rate: 90 {beats}/min

## 2022-02-28 NOTE — Procedures (Signed)
NAMETONICA, BRASINGTON   HOSPITAL NUMBER:  [id number]  DATE OF SERVICE:  02/25/2022  DOB:  1991/04/10  SEX:  F      NAME OF STUDY:  Unattended polysomnogram.    INDICATIONS FOR TESTING:  Snoring and excessive daytime sleepiness.    DESCRIPTION OF STUDY:  The patient was studied using the ApneaLink system on February 25, 2022, for 8 hours and 20 minutes.  The patient had 1 apnea and 22 hypopneas recorded.  The Apnea-Hypopnea index was 3.0.  The mean oxygen saturation was 94%.  The minimum saturation was 88%.    IMPRESSION:  No evidence of significant sleep-disordered breathing, apnea or nocturnal oxygen desaturation.    RECOMMENDATION:  CPAP is not indicated at this time.        Lillia Dallas, MD            DD:  02/27/2022 07:48:39  DT:  02/28/2022 06:28:10 DW  D#:  [id number]

## 2022-03-03 ENCOUNTER — Other Ambulatory Visit (HOSPITAL_BASED_OUTPATIENT_CLINIC_OR_DEPARTMENT_OTHER): Payer: Self-pay | Admitting: Family Medicine

## 2022-03-03 ENCOUNTER — Telehealth (HOSPITAL_BASED_OUTPATIENT_CLINIC_OR_DEPARTMENT_OTHER): Payer: Self-pay | Admitting: Family Medicine

## 2022-03-03 DIAGNOSIS — G4709 Other insomnia: Secondary | ICD-10-CM

## 2022-03-03 MED ORDER — TRAZODONE 50 MG TABLET
50.0000 mg | ORAL_TABLET | Freq: Every evening | ORAL | 0 refills | Status: DC
Start: 2022-03-03 — End: 2022-03-27

## 2022-03-03 NOTE — Telephone Encounter (Signed)
Pt requesting a different medication as she states insurance denied prescription that was sent in. She would like a call back [phone]

## 2022-03-03 NOTE — Telephone Encounter (Signed)
Returned pt's call regarding earlier message that medication was denied through insurance. No answer. LVM to return call.

## 2022-03-04 LAB — 7 DAY EXTENDED HOLTER MONITOR
Heart rate (average): 98 {beats}/min
Isolated SVE count: 103 episodes
Isolated VE Counts: 1369 episodes
SVE Couplets Counts: 14 episodes
VE Couplets Counts: 22 episodes

## 2022-03-05 ENCOUNTER — Telehealth (HOSPITAL_BASED_OUTPATIENT_CLINIC_OR_DEPARTMENT_OTHER): Payer: Self-pay | Admitting: Family Medicine

## 2022-03-05 NOTE — Telephone Encounter (Signed)
Returned pt's call regarding [place] denial for Doxepin. Pt stated that she had gotten a message from her pharmacy that the Rx for trazodone had been put in. Pt was already able to pick up Rx and start taking medication. Pt wanted to let provider know that she is not going to be able to see the cardiologist in [place] until 09/25/2022. However, she does not want to see an out of town cardiologist.

## 2022-03-23 ENCOUNTER — Telehealth: Payer: PPO | Admitting: Physician Assistant

## 2022-03-23 DIAGNOSIS — R0981 Nasal congestion: Secondary | ICD-10-CM

## 2022-03-23 MED ORDER — SODIUM BICARBONATE-SODIUM CHLORIDE-NETI POT NASAL RINSE WITH PACKET
1.0000 | PACK | Freq: Two times a day (BID) | ENDOSINUSIAL | 0 refills | Status: DC
Start: 2022-03-23 — End: 2022-04-12

## 2022-03-23 MED ORDER — OXYMETAZOLINE 0.05 % NASAL SPRAY
1.0000 | Freq: Two times a day (BID) | NASAL | 0 refills | Status: DC | PRN
Start: 2022-03-23 — End: 2022-10-24

## 2022-03-23 MED ORDER — BENZONATATE 100 MG CAPSULE
ORAL_CAPSULE | ORAL | 0 refills | Status: DC
Start: 2022-03-23 — End: 2022-04-12

## 2022-03-23 NOTE — Progress Notes (Signed)
[facility]  [address]  [place] [address]  E-Visit Note  Attending Dr. Zada Girt  Reason for E-Visit: Visit Diagnosis with associated orders: No diagnosis found.  Mychart E-Visit Sinus 1       Question 03/23/2022  7:16 AM EST - Filed by Patient    Which of the following have you been experiencing? (congested nose, pain around nose and face, headache, ear pain, neck pain, cough, sneezing, watery eyes) Congested nose     Pain around the nose and face     Headache    In the last month, have you been in contact with someone who was confirmed or suspected to have Coronavirus / COVID-19? No / Unsure    Have you had any of the following? (difficulty swallowing, difficulty breathing, dificulty seeing or seeing blurry or double) None of the above    If we need to reach you today, what is the best method of contact?  Please indicate [facility] or list the best phone number for contact. [phone]    Do you have chronic exposure to second hand smoke?  No    How long have you been having these symptoms? For one to four weeks    Do you have a fever? No, I do not have a fever    Do you smoke? Yes    Do you have any chronic illnesses, such as diabetes, heart disease, or lung disease, or any illness that would weaken your body's ability to fight infection? No    Have you experienced similar problems in the past? Yes    What treatments have worked in the past?  What has not worked? Steroid and Z pack    Is this illness similar to previous illnesses you have had?  How is it the same?  How is it different? Yes I get sinus infections all the time. I have really bad sinus issues    Have you recently been hospitalized? No    What medications are you currently taking for these symptoms? Nose spray    Please enter the names of any medications you are taking, or any other treatments you are trying. Sinex nose spray    Are you pregnant? I am confident that I am not pregnant    Are you  breastfeeding? No    Anything else you would like to add?               No images are attached to the encounter or orders placed in the encounter.   Assessment:   No diagnosis found.  Plan:    Orders Placed This Encounter    oxymetazoline (AFRIN) 0.05 % Nasal Spray, Non-Aerosol    sod bicarb-sod chlor-neti pot sinus irrigation packet with rinse device    benzonatate (TESSALON PERLES) 100 mg Oral Capsule            Orders Placed This Encounter    oxymetazoline (AFRIN) 0.05 % Nasal Spray, Non-Aerosol    sod bicarb-sod chlor-neti pot sinus irrigation packet with rinse device    benzonatate (TESSALON PERLES) 100 mg Oral Capsule         Hello there,      It sounds that you have caught a virus causing an upper respiratory/ sinus infection. OTC medications, nasal sprays, nasal irrigation, oral hydration, and physical rest are included in the mainstay of treatment for these types of illnesses. Because antibiotics only kill bacteria, they are not helpful in viral conditions. Additionally,  steroids are most useful in the setting of wheezing in the lungs. If you believe you have this please be seen in person so we can examine you. Otherwise, the risks of suppressing your immune system and causing life long risk of hip fractures and cataracts outweigh the benefits in this setting. If you develop a new fever (100.24F) later in the illness, or continue to have a fever for more than 5 days, you should be evaluated by your physician or [facility]. If you need a COVID test, this can be performed as a nursing visit at [facility] without an order from a provider.  Just tell the front desk you do not need to see a doctor because you already did an e-visit and you just need a COVID test. Prescriptions have been to your pharmacy. If symptoms have lasted for more than 10 days and you are still feeling under the weather with no improvement in things such as energy, appetite, etc you should be evaluated in person. You are likely contagious  to others so be sure to wear a mask, wash your hands frequently, and do not eat or drink after others. Feel better soon!      If for some reason you need to charge your pharmacy, simply call the new preferred pharmacy and ask them to retrieve the prescription from the original pharmacy.     The patient (or their delegate) initiated request for e-visit using MyChart patient portal.  See MyChart messages documented in this encounter for details of online digital evaluation and management provided.    I personally spent 4 minutes of cumulative time performing this service (within 7 days since e-visit initiated).    Evlyn Courier [place], PA-C  03/23/2022, 07:37

## 2022-03-25 ENCOUNTER — Ambulatory Visit (HOSPITAL_COMMUNITY): Payer: Self-pay

## 2022-03-26 ENCOUNTER — Ambulatory Visit: Payer: PPO | Attending: Family Medicine | Admitting: Family Medicine

## 2022-03-26 ENCOUNTER — Other Ambulatory Visit: Payer: Self-pay

## 2022-03-26 ENCOUNTER — Encounter (HOSPITAL_BASED_OUTPATIENT_CLINIC_OR_DEPARTMENT_OTHER): Payer: Self-pay | Admitting: Family Medicine

## 2022-03-26 ENCOUNTER — Other Ambulatory Visit (HOSPITAL_BASED_OUTPATIENT_CLINIC_OR_DEPARTMENT_OTHER): Payer: Self-pay | Admitting: Family Medicine

## 2022-03-26 ENCOUNTER — Ambulatory Visit (HOSPITAL_BASED_OUTPATIENT_CLINIC_OR_DEPARTMENT_OTHER): Payer: Self-pay | Admitting: Family Medicine

## 2022-03-26 VITALS — BP 138/82 | HR 104 | Temp 99.1°F | Ht 62.5 in | Wt 259.8 lb

## 2022-03-26 DIAGNOSIS — R000 Tachycardia, unspecified: Secondary | ICD-10-CM | POA: Insufficient documentation

## 2022-03-26 DIAGNOSIS — K219 Gastro-esophageal reflux disease without esophagitis: Secondary | ICD-10-CM | POA: Insufficient documentation

## 2022-03-26 DIAGNOSIS — G4709 Other insomnia: Secondary | ICD-10-CM | POA: Insufficient documentation

## 2022-03-26 MED ORDER — METOPROLOL SUCCINATE ER 50 MG TABLET,EXTENDED RELEASE 24 HR
50.0000 mg | ORAL_TABLET | Freq: Every day | ORAL | 0 refills | Status: DC
Start: 2022-03-26 — End: 2022-07-11

## 2022-03-26 MED ORDER — PANTOPRAZOLE 20 MG TABLET,DELAYED RELEASE
20.0000 mg | DELAYED_RELEASE_TABLET | Freq: Two times a day (BID) | ORAL | 0 refills | Status: DC
Start: 2022-03-26 — End: 2022-07-11

## 2022-03-27 ENCOUNTER — Encounter (HOSPITAL_BASED_OUTPATIENT_CLINIC_OR_DEPARTMENT_OTHER): Payer: Self-pay | Admitting: Family Medicine

## 2022-03-27 MED ORDER — TRAZODONE 50 MG TABLET
50.0000 mg | ORAL_TABLET | Freq: Every evening | ORAL | 0 refills | Status: DC
Start: 2022-03-27 — End: 2022-07-11

## 2022-03-27 NOTE — Progress Notes (Signed)
[facility]  [address] [place] [address]  Operated by [facility]    Progress Note     Name: Kristina Baxter MRN:  [id number]   Date: 03/26/2022 Age: 31 y.o.       Chief Complaint: Follow Up (Pt here for 4 wk follow up for meds. Pt states she still has acid reflux at night but it has improved during the day. Sleep has improved but she does still wake up through the night. Tachycardia has improved. Kalen Ellifritz, MA/)    History of Present Illness:  Patient is seen today for a 4 week follow-up for medications changes.    Patient was started on metoprolol for tachycardia. She states that the episodes of increased HR has improved. She is Tachy in the office today at 104. Will increase metoprolol to 50 mg daily.     Patient was started on pantoprazole 20 mg daily in the am. She states that the medication is controlling Reflux during the day, but she is experiencing increased reflux at night. Will increase to 20 mg twice daily AC. Have discussed GERD precautions, especially after dinner at night.     Patient states that the Trazodone has increased her hours of sleep at night. Will continue 50 mg nightly prior to bed.     Will follow-up on the above medication changes in 4 weeks.     Past Medical History  Past Medical History:   Diagnosis Date    Anxiety     Depression          Past Surgical History:   Procedure Laterality Date    DENTAL SURGERY      FOOT SURGERY Right          Current Outpatient Medications   Medication Sig    benzonatate (TESSALON PERLES) 100 mg Oral Capsule May take 1-2 caps every 8 hours as needed for cough    buPROPion (WELLBUTRIN SR) 150 mg Oral tablet sustained-release 12 hr Take 1 Tablet (150 mg total) by mouth Once a day Indications: anxiousness associated with depression    etonogestreL (NEXPLANON) 68 mg Subdermal Implant 68 mg by Subdermal route One time For subdermal insertion by health care providers trained in the insertion and removal procedure.     metoprolol succinate (TOPROL-XL) 50 mg Oral Tablet Sustained Release 24 hr Take 1 Tablet (50 mg total) by mouth Once a day for 90 days Indications: Tachycardia    oxymetazoline (AFRIN) 0.05 % Nasal Spray, Non-Aerosol Administer 1-2 Sprays into each nostril Twice per day as needed (NO MORE than 3 (THREE DAYS) of continuous use!)    pantoprazole (PROTONIX) 20 mg Oral Tablet, Delayed Release (E.C.) Take 1 Tablet (20 mg total) by mouth Twice a day before meals for 90 days Indications: gastroesophageal reflux disease    sod bicarb-sod chlor-neti pot sinus irrigation packet with rinse device 1 Dose by sinus irrigation route Twice daily (Patient not taking: Reported on 03/26/2022)    traZODone (DESYREL) 50 mg Oral Tablet Take 1 Tablet (50 mg total) by mouth Every night Indications: insomnia associated with depression     Allergies   Allergen Reactions    Doxycycline Hives/ Urticaria    Fish Derived Nausea/ Vomiting    Klonopin [Clonazepam] Hives/ Urticaria    Minocycline Hives/ Urticaria     Social History     Tobacco Use   Smoking Status Every Day    Packs/day: .25    Types: Cigarettes  Smokeless Tobacco Never       Family History  Family Medical History:       Problem Relation (Age of Onset)    Alzheimer's/Dementia Maternal Grandmother    Cancer Maternal Grandfather    Diabetes Maternal Grandfather    Heart Attack Mother, Maternal Grandmother, Maternal Grandfather    Hypertension (High Blood Pressure) Mother, Maternal Grandmother, Maternal Grandfather    Lung disease Maternal Grandmother    No Known Problems Sister    Stroke Maternal Grandfather              Review of Systems  All pertinent Review of System as address in the HPI.    Review of Systems Reviewed    Imaging: ECG (In Clinic, Same Day)    Result Date: 02/27/2022  Normal sinus rhythm Normal ECG No previous ECGs available Confirmed by Chaney Malling [phone]) on 02/27/2022 1:31:01 PM      LABS REVIEWED  CBC  Diff   Lab Results   Component Value Date/Time    WBC 5.6  02/13/2022 07:41 AM    HGB 14.3 02/13/2022 07:41 AM    HCT 41.7 02/13/2022 07:41 AM    PLTCNT 216 02/13/2022 07:41 AM    RBC 4.40 02/13/2022 07:41 AM    MCV 94.8 02/13/2022 07:41 AM    MCHC 34.3 02/13/2022 07:41 AM    MCH 32.5 (H) 02/13/2022 07:41 AM    MPV 8.5 (L) 02/13/2022 07:41 AM    Lab Results   Component Value Date/Time    PMNS 60 02/13/2022 07:41 AM    MONOCYTES 7 02/13/2022 07:41 AM    BASOPHILS 1 02/13/2022 07:41 AM    BASOPHILS 0.04 02/13/2022 07:41 AM    PMNABS 3.39 02/13/2022 07:41 AM    LYMPHSABS 1.62 02/13/2022 07:41 AM    EOSABS 0.18 02/13/2022 07:41 AM    MONOSABS 0.38 02/13/2022 07:41 AM          COMPREHENSIVE METABOLIC PANEL   Lab Results   Component Value Date    SODIUM 139 02/13/2022    POTASSIUM 4.2 02/13/2022    CHLORIDE 109 02/13/2022    CO2 25 02/13/2022    ANIONGAP 5 02/13/2022    BUN 13 02/13/2022    CREATININE 0.83 02/13/2022    CALCIUM 9.0 02/13/2022    ALBUMIN 3.6 02/13/2022    TOTALPROTEIN 6.9 02/13/2022    ALKPHOS 67 02/13/2022    AST 36 02/13/2022    ALT 58 (H) 02/13/2022           Lab Results   Component Value Date    CHOLESTEROL 136 02/13/2022    HDLCHOL 37 (L) 02/13/2022    LDLCHOL 85 02/13/2022    TRIG 69 02/13/2022        Office Visit on 02/26/2022   Component Date Value Ref Range Status    Ventricular rate 02/26/2022 90  BPM Final    Atrial Rate 02/26/2022 90  BPM Final    PR Interval 02/26/2022 162  ms Final    QRS Duration 02/26/2022 72  ms Final    QT Interval 02/26/2022 338  ms Final    QTC Calculation 02/26/2022 413  ms Final    Calculated P Axis 02/26/2022 64  degrees Final    Calculated R Axis 02/26/2022 9  degrees Final    Calculated T Axis 02/26/2022 37  degrees Final       The ASCVD Risk score (Arnett DK, et al., 2019) failed to calculate for the following reasons:  The 2019 ASCVD risk score is only valid for ages 74 to 16    Vitals:    03/26/22 1632   BP: 138/82   Pulse: (!) 104   Temp: 37.3 C (99.1 F)   TempSrc: Tympanic   SpO2: 98%   Weight: 118 kg (259 lb 12.8  oz)   Height: 1.588 m (5' 2.5")   BMI: 46.86       Physical Examination:  Physical Exam  Vitals and nursing note reviewed.   Constitutional:       General: She is not in acute distress.     Appearance: Normal appearance. She is obese. She is not ill-appearing.   Cardiovascular:      Rate and Rhythm: Regular rhythm. Tachycardia present.      Pulses: Normal pulses.      Heart sounds: Normal heart sounds. No murmur heard.     No friction rub. No gallop.   Pulmonary:      Effort: Pulmonary effort is normal.      Breath sounds: Normal breath sounds. No wheezing, rhonchi or rales.   Musculoskeletal:         General: Normal range of motion.   Skin:     General: Skin is warm and dry.   Neurological:      General: No focal deficit present.      Mental Status: She is alert and oriented to person, place, and time. Mental status is at baseline.   Psychiatric:         Mood and Affect: Mood normal.          Procedures         Assessment and Plan  ENCOUNTER DIAGNOSES     ICD-10-CM   1. Tachycardia  R00.0   2. Gastroesophageal reflux disease without esophagitis  K21.9   3. 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     Orders Placed This Encounter    Refer to External Provider    metoprolol succinate (TOPROL-XL) 50 mg Oral Tablet Sustained Release 24 hr    pantoprazole (PROTONIX) 20 mg Oral Tablet, Delayed Release (E.C.)         (R00.0) Tachycardia  (primary encounter diagnosis)  Increase metoprolol succinate to (TOPROL-XL) 50 mg Oral         Tablet Sustained Release 24 hr,   Refer to External Provider, [facility] cardiology            (K21.9) Gastroesophageal reflux disease without esophagitis  Increase pantoprazole (PROTONIX) 20 mg Oral Tablet,         Delayed Release (E.C.), twice daily AC            (G47.09) Other insomnia  Continue Trazodone 50 mg nightly      The patient was given the opportunity to ask questions and those questions were answered to the patient's satisfaction. The patient was encouraged to call with any additional questions or  concerns.     There are no Patient Instructions on file for this visit.    Return in about 4 weeks (around 04/23/2022) for Follow-up medication changes.    Cleda Daub, FNP-BC      This note may have been partially generated using MModal Fluency Direct system, and there may be some incorrect words, spellings, and punctuation that were not noted in checking the note before saving.

## 2022-03-28 ENCOUNTER — Ambulatory Visit (HOSPITAL_BASED_OUTPATIENT_CLINIC_OR_DEPARTMENT_OTHER): Payer: PPO | Admitting: Family Medicine

## 2022-04-12 ENCOUNTER — Other Ambulatory Visit: Payer: Self-pay

## 2022-04-12 ENCOUNTER — Ambulatory Visit: Payer: PPO | Attending: Nurse Practitioner | Admitting: Nurse Practitioner

## 2022-04-12 ENCOUNTER — Encounter (INDEPENDENT_AMBULATORY_CARE_PROVIDER_SITE_OTHER): Payer: Self-pay | Admitting: Nurse Practitioner

## 2022-04-12 VITALS — BP 128/88 | HR 86 | Temp 97.3°F | Resp 18 | Wt 258.0 lb

## 2022-04-12 DIAGNOSIS — Z1152 Encounter for screening for COVID-19: Secondary | ICD-10-CM | POA: Insufficient documentation

## 2022-04-12 DIAGNOSIS — R059 Cough, unspecified: Secondary | ICD-10-CM | POA: Insufficient documentation

## 2022-04-12 LAB — COVID-19 ~~LOC~~ MOLECULAR LAB TESTING
INFLUENZA VIRUS TYPE A: NOT DETECTED
INFLUENZA VIRUS TYPE B: NOT DETECTED
RESPIRATORY SYNCTIAL VIRUS (RSV): NOT DETECTED
SARS-CoV-2: NOT DETECTED

## 2022-04-12 MED ORDER — PROMETHAZINE-DM 6.25 MG-15 MG/5 ML ORAL SYRUP
5.0000 mL | ORAL_SOLUTION | Freq: Four times a day (QID) | ORAL | 0 refills | Status: DC | PRN
Start: 2022-04-12 — End: 2022-04-17

## 2022-04-12 MED ORDER — AMOXICILLIN 875 MG-POTASSIUM CLAVULANATE 125 MG TABLET
1.0000 | ORAL_TABLET | Freq: Two times a day (BID) | ORAL | 0 refills | Status: AC
Start: 2022-04-12 — End: 2022-04-22

## 2022-04-12 MED ORDER — PREDNISONE 20 MG TABLET
20.0000 mg | ORAL_TABLET | Freq: Two times a day (BID) | ORAL | 0 refills | Status: AC
Start: 2022-04-12 — End: 2022-04-17

## 2022-04-12 MED ORDER — ALBUTEROL SULFATE HFA 90 MCG/ACTUATION AEROSOL INHALER
1.0000 | INHALATION_SPRAY | Freq: Four times a day (QID) | RESPIRATORY_TRACT | 0 refills | Status: AC | PRN
Start: 2022-04-12 — End: ?

## 2022-04-12 NOTE — Progress Notes (Signed)
[facility]  [address]  [place] [address]  Operated by [facility]     Name: Kristina Baxter MRN:  [id number]   Date of Birth: Dec 08, 1990 Age: 31 y.o.   Date: 04/12/2022  Time: 11:38     Provider: Myrna Blazer, FNP-BC  PCP: Cleda Daub, FNP-BC    Reason for visit:   Chief Complaint              Cough Since the 13th    Congestion     Wheezing             Vitals:   Vitals:    04/12/22 1127   BP: 128/88   Pulse: 86   Resp: 18   Temp: 36.3 C (97.3 F)   TempSrc: Tympanic   SpO2: 98%   Weight: 117 kg (258 lb)        History of Present Illness:  Kristina Baxter is a 31 y.o. female presenting with complaints of cough congestion and runny nose.  Patient reports symptoms present for about a week or so.  Denies any fevers.  Denies sick contacts.  Denies COPD or asthma does smoke and has continued to smoke.  States she feels like she has bronchitis or sinus infection.    Past Medical History:  Past Medical History:   Diagnosis Date    Anxiety     Depression          Past Surgical History:  Past Surgical History:   Procedure Laterality Date    DENTAL SURGERY      FOOT SURGERY Right          Allergies:  Allergies   Allergen Reactions    Doxycycline Hives/ Urticaria    Fish Derived Nausea/ Vomiting    Klonopin [Clonazepam] Hives/ Urticaria    Minocycline Hives/ Urticaria     Medications:  Current Outpatient Medications   Medication Sig    albuterol sulfate (PROVENTIL HFA) 90 mcg/actuation Inhalation oral inhaler Take 1-2 Puffs by inhalation Every 6 hours as needed    amoxicillin-pot clavulanate (AUGMENTIN) 875-125 mg Oral Tablet Take 1 Tablet by mouth Twice daily for 10 days    buPROPion (WELLBUTRIN SR) 150 mg Oral tablet sustained-release 12 hr Take 1 Tablet (150 mg total) by mouth Once a day Indications: anxiousness associated with depression    etonogestreL (NEXPLANON) 68 mg Subdermal Implant 68 mg by Subdermal route One time For subdermal insertion by health care providers trained in the insertion  and removal procedure.    metoprolol succinate (TOPROL-XL) 50 mg Oral Tablet Sustained Release 24 hr Take 1 Tablet (50 mg total) by mouth Once a day for 90 days Indications: Tachycardia    oxymetazoline (AFRIN) 0.05 % Nasal Spray, Non-Aerosol Administer 1-2 Sprays into each nostril Twice per day as needed (NO MORE than 3 (THREE DAYS) of continuous use!)    pantoprazole (PROTONIX) 20 mg Oral Tablet, Delayed Release (E.C.) Take 1 Tablet (20 mg total) by mouth Twice a day before meals for 90 days Indications: gastroesophageal reflux disease    predniSONE (DELTASONE) 20 mg Oral Tablet Take 1 Tablet (20 mg total) by mouth Twice daily for 5 days    promethazine-dextromethorphan (PHENERGAN-DM) 6.25-15 mg/5 mL Oral Syrup Take 5 mL by mouth Four times a day as needed for Cough    traZODone (DESYREL) 50 mg Oral Tablet Take 1 Tablet (50 mg total) by mouth Every night Indications: insomnia associated with depression     Family History:  Family Medical History:       Problem Relation (Age of Onset)    Alzheimer's/Dementia Maternal Grandmother    Cancer Maternal Grandfather    Diabetes Maternal Grandfather    Heart Attack Mother, Maternal Grandmother, Maternal Grandfather    Hypertension (High Blood Pressure) Mother, Maternal Grandmother, Maternal Grandfather    Lung disease Maternal Grandmother    No Known Problems Sister    Stroke Maternal Grandfather            Social History:  Social History     Socioeconomic History    Marital status: Single   Occupational History    Occupation: [employer]     Comment: FT   Tobacco Use    Smoking status: Every Day     Packs/day: .25     Types: Cigarettes    Smokeless tobacco: Never   Vaping Use    Vaping Use: Some days   Substance and Sexual Activity    Alcohol use: Not Currently    Drug use: Never    Sexual activity: Yes     Partners: Male     Birth control/protection: Implant     Social Determinants of Health     Financial Resource Strain: Low Risk  (02/12/2022)    Financial Resource  Strain     SDOH Financial: No   Transportation Needs: Low Risk  (02/12/2022)    Transportation Needs     SDOH Transportation: No   Social Connections: Low Risk  (02/12/2022)    Social Connections     SDOH Social Isolation: 5 or more times a week   Intimate Partner Violence: Low Risk  (02/12/2022)    Intimate Partner Violence     SDOH Domestic Violence: No   Housing Stability: Low Risk  (02/12/2022)    Housing Stability     SDOH Housing Situation: I have housing.       Review of Systems:  All pertinent Review of System as address in the HPI.    Physical Exam:  General Appearance: Alert and oriented; No acute distress; Cooperative; Appears stated age  Head: Normocephalic; Atraumatic  Eyes: PERRL; EOMI; Conjunctivae clear  Lungs:  Slight expiratory wheeze no rales or rhonchi   Heart: Regular rate and rhythm; No murmurs appreciated  Neurological: Alert and oriented     There are no exam notes on file for this visit.     Assessment:    ICD-10-CM    1. Cough, unspecified type  R05.9 COVID-19 SCREENING - SYMPTOMATIC (OP)           Plan:  Orders Placed This Encounter    COVID-19 SCREENING - SYMPTOMATIC (OP)    albuterol sulfate (PROVENTIL HFA) 90 mcg/actuation Inhalation oral inhaler    predniSONE (DELTASONE) 20 mg Oral Tablet    amoxicillin-pot clavulanate (AUGMENTIN) 875-125 mg Oral Tablet    promethazine-dextromethorphan (PHENERGAN-DM) 6.25-15 mg/5 mL Oral Syrup   COVID test pending.  Albuterol, prednisone, Augmentin, promethazine DM for bronchitis versus sinusitis.    I recommend following up with your primary care provider in 3-5 days.  If your symptoms worsen or fail to improve I recommend going to the emergency department for further evaluation and management.    No follow-ups on file.    Myrna Blazer, FNP-BC     Portions of this note may be dictated using voice recognition software or a dictation service. Variances in spelling and vocabulary are possible and unintentional. Not all errors are caught/corrected. Please  notify the Thereasa Parkin if any  discrepancies are noted or if the meaning of any statement is not clear.

## 2022-04-17 ENCOUNTER — Other Ambulatory Visit (HOSPITAL_BASED_OUTPATIENT_CLINIC_OR_DEPARTMENT_OTHER): Payer: Self-pay | Admitting: Family Medicine

## 2022-04-17 MED ORDER — PROMETHAZINE-DM 6.25 MG-15 MG/5 ML ORAL SYRUP
5.0000 mL | ORAL_SOLUTION | Freq: Four times a day (QID) | ORAL | 0 refills | Status: DC | PRN
Start: 2022-04-17 — End: 2022-07-11

## 2022-04-17 NOTE — Telephone Encounter (Signed)
Per call from pt she was in [facility] care 12/22 and was given cough meds. She is almost out and would like to know if Helene Kelp can send in a refill for it. [facility] in [place]

## 2022-04-23 ENCOUNTER — Ambulatory Visit (HOSPITAL_BASED_OUTPATIENT_CLINIC_OR_DEPARTMENT_OTHER): Payer: PPO | Admitting: Family Medicine

## 2022-04-29 ENCOUNTER — Ambulatory Visit (HOSPITAL_COMMUNITY): Payer: Self-pay

## 2022-05-01 ENCOUNTER — Ambulatory Visit (HOSPITAL_BASED_OUTPATIENT_CLINIC_OR_DEPARTMENT_OTHER): Payer: Self-pay | Admitting: Family Medicine

## 2022-05-05 ENCOUNTER — Ambulatory Visit (HOSPITAL_BASED_OUTPATIENT_CLINIC_OR_DEPARTMENT_OTHER): Payer: Self-pay | Admitting: Family Medicine

## 2022-05-20 ENCOUNTER — Ambulatory Visit (HOSPITAL_COMMUNITY): Payer: Self-pay

## 2022-05-26 ENCOUNTER — Telehealth (HOSPITAL_BASED_OUTPATIENT_CLINIC_OR_DEPARTMENT_OTHER): Payer: Self-pay | Admitting: Family Medicine

## 2022-05-26 NOTE — Telephone Encounter (Signed)
2/5   Called to follow up on the Echo that Helene Kelp ordered for the patient. She canceled her appt. Patient does not have insurance at this time. She has applied for Medicaid & is waiting on the approval or denial letter. She is out of town at this moment, but I gave the patient my direct number [phone] to call when she hears from [facility]. Deferring this order out for a few weeks, until patient is back in town.

## 2022-06-03 ENCOUNTER — Emergency Department: Payer: Medicaid - Out of State

## 2022-06-03 ENCOUNTER — Encounter: Payer: Self-pay | Admitting: Emergency Medicine

## 2022-06-03 ENCOUNTER — Other Ambulatory Visit: Payer: Self-pay

## 2022-06-03 ENCOUNTER — Emergency Department
Admission: EM | Admit: 2022-06-03 | Discharge: 2022-06-03 | Disposition: A | Discharge status: Home / Self Care | Payer: Medicaid - Out of State | Attending: Emergency Medicine | Admitting: Emergency Medicine

## 2022-06-03 DIAGNOSIS — W109XXA Fall (on) (from) unspecified stairs and steps, initial encounter: Secondary | ICD-10-CM | POA: Insufficient documentation

## 2022-06-03 DIAGNOSIS — M79605 Pain in left leg: Secondary | ICD-10-CM | POA: Diagnosis present

## 2022-06-03 DIAGNOSIS — S8000XA Contusion of unspecified knee, initial encounter: Secondary | ICD-10-CM

## 2022-06-03 DIAGNOSIS — Z87891 Personal history of nicotine dependence: Secondary | ICD-10-CM | POA: Insufficient documentation

## 2022-06-03 DIAGNOSIS — S8002XA Contusion of left knee, initial encounter: Secondary | ICD-10-CM | POA: Diagnosis not present

## 2022-06-03 DIAGNOSIS — Y92009 Unspecified place in unspecified non-institutional (private) residence as the place of occurrence of the external cause: Secondary | ICD-10-CM | POA: Diagnosis not present

## 2022-06-03 DIAGNOSIS — Y9301 Activity, walking, marching and hiking: Secondary | ICD-10-CM | POA: Diagnosis not present

## 2022-06-03 DIAGNOSIS — R600 Localized edema: Secondary | ICD-10-CM

## 2022-06-03 DIAGNOSIS — S8012XA Contusion of left lower leg, initial encounter: Secondary | ICD-10-CM | POA: Diagnosis not present

## 2022-06-03 NOTE — Discharge Instructions (Signed)
Your x-rays and ultrasound were normal.  Please follow-up with orthopedics if your symptoms persist.  You may wear the Ace wrap during the day and remove it at night.  Please return for any new, worsening, or change in symptoms or other concerns.

## 2022-06-03 NOTE — ED Triage Notes (Signed)
Pt sts that she fell when walking out of her cousins house as she did not see the step. Pt sts that she landed on her left knee two weeks ago and her leg has been swollen ever since with bruising to her left foot. Pt is ambulatory with no assistance needed.

## 2022-06-03 NOTE — ED Provider Notes (Signed)
[facility] Provider Note    Event Date/Time   First MD Initiated Contact with Patient 06/03/22 1146     (approximate)   History   Leg Swelling   HPI  Diane Stokes is a 32 y.o. female who presents today for evaluation of morbid obesity, who presents today for evaluation of left leg pain.  She reports that she was walking out of her cousin's house and did not see a step and she fell forward and landed on her left knee.  She reports that she has had swelling ever since as well as bruising to her foot.  She has been able to ambulate.  Patient Active Problem List   Diagnosis Date Noted   Palpitations 04/22/2016   Smoker 04/22/2016   SOB (shortness of breath) on exertion 04/22/2016   Class 3 obesity due to excess calories with body mass index (BMI) of 40.0 to 44.9 in adult 12/27/2013   Morbid obesity ([place]) 05/19/2013   Abnormal Pap smear of cervix 05/19/2013          Physical Exam   Triage Vital Signs: ED Triage Vitals [06/03/22 1141]  Enc Vitals Group     BP (!) 113/94     Pulse Rate 89     Resp      Temp 98 F (36.7 C)     Temp Source Oral     SpO2 100 %     Weight 233 lb (105.7 kg)     Height      Head Circumference      Peak Flow      Pain Score 8     Pain Loc      Pain Edu?      Excl. in [place]?     Most recent vital signs: Vitals:   06/03/22 1141  BP: (!) 113/94  Pulse: 89  Temp: 98 F (36.7 C)  SpO2: 100%    Physical Exam Vitals and nursing note reviewed.  Constitutional:      General: Awake and alert. No acute distress.    Appearance: Normal appearance. The patient is obese  HENT:     Head: Normocephalic and atraumatic.     Mouth: Mucous membranes are moist.  Eyes:     General: PERRL. Normal EOMs        Right eye: No discharge.        Left eye: No discharge.     Conjunctiva/sclera: Conjunctivae normal.  Cardiovascular:     Rate and Rhythm: Normal rate and regular rhythm.     Pulses: Normal pulses.     Heart  sounds: Normal heart sounds Pulmonary:     Effort: Pulmonary effort is normal. No respiratory distress.     Breath sounds: Normal breath sounds.  Abdominal:     Abdomen is soft. There is no abdominal tenderness. No rebound or guarding. No distention. Musculoskeletal:        General: No swelling. Normal range of motion.     Cervical back: Normal range of motion and neck supple.  Left lower extremity: Swelling and ecchymosis noted to the anterior lower leg extending into the lateral aspect of her foot.  There is swelling circumferentially to her lower leg, the compartments are soft and compressible.  She has 2+ pedal pulses.  Sensation intact light touch distally.  Normal capillary refill.  Normal range of motion of knee, ankle, toes.  Negative logroll of the hip.  Sensation intact light touch throughout. Skin:  General: Skin is warm and dry.     Capillary Refill: Capillary refill takes less than 2 seconds.     Findings: No rash.  Neurological:     Mental Status: The patient is awake and alert.      ED Results / Procedures / Treatments   Labs (all labs ordered are listed, but only abnormal results are displayed) Labs Reviewed - No data to display   EKG     RADIOLOGY I independently reviewed and interpreted imaging and agree with radiologists findings.     PROCEDURES:  Critical Care performed:   Procedures   MEDICATIONS ORDERED IN ED: Medications - No data to display   IMPRESSION / MDM / [place] / ED COURSE  I reviewed the triage vital signs and the nursing notes.   Differential diagnosis includes, but is not limited to, contusion, hematoma, fracture, dislocation, DVT.  Patient is awake and alert, hemodynamically stable and neurovascularly intact.  She has obvious ecchymosis in various stages of healing to her left lower leg.  There is no ligamental laxity in her knee.  Her compartments are soft compressible throughout, normal distal pulses and normal  sensation, not consistent with compartment syndrome.  No pain out of proportion.  X-rays obtained are negative for any acute bony injury or joint effusion to suggest ligamental injury.  Duplex ultrasound obtained to evaluate for potential DVT given her circumferential swelling which was also negative.  I suspect dependent edema and ecchymosis.  Normal distal pulses, no pain out of proportion, and timeframe of injury also not consistent with popliteal vessel injury.  She was given an Ace wrap for extra support.  She was instructed to remove it at night to decrease her risk for developing a blood clot.  Also recommended rest, ice, elevation.  She was instructed to follow-up with orthopedics and the appropriate information was provided.  We discussed symptomatic management and return precautions.  Patient understands and agrees with plan.  She was discharged in stable condition.   Patient's presentation is most consistent with acute complicated illness / injury requiring diagnostic workup.    FINAL CLINICAL IMPRESSION(S) / ED DIAGNOSES   Final diagnoses:  Leg edema  Contusion of knee and lower leg, initial encounter     Rx / DC Orders   ED Discharge Orders     None        Note:  This document was prepared using Dragon voice recognition software and may include unintentional dictation errors.   Emeline Gins 06/03/22 1348    Lavonia Drafts, MD 06/03/22 [phone]

## 2022-06-19 ENCOUNTER — Telehealth (HOSPITAL_BASED_OUTPATIENT_CLINIC_OR_DEPARTMENT_OTHER): Payer: Self-pay | Admitting: Family Medicine

## 2022-06-19 NOTE — Telephone Encounter (Signed)
2/29 Called & spoke to patient, she is still out of town, she asked if I would call back in a month & see if she can schl. I am deferring this order out for 1 month. Patient now has [facility], we were waiting on new ins information.

## 2022-07-04 IMAGING — DX DG FOOT COMPLETE 3+V*L*
3 series · 3 of 3 positions shown · non-contrast
Comparison: None.

CLINICAL DATA: Pain and swelling

EXAM:
LEFT FOOT - COMPLETE 3+ VIEW

[foot supine dp]
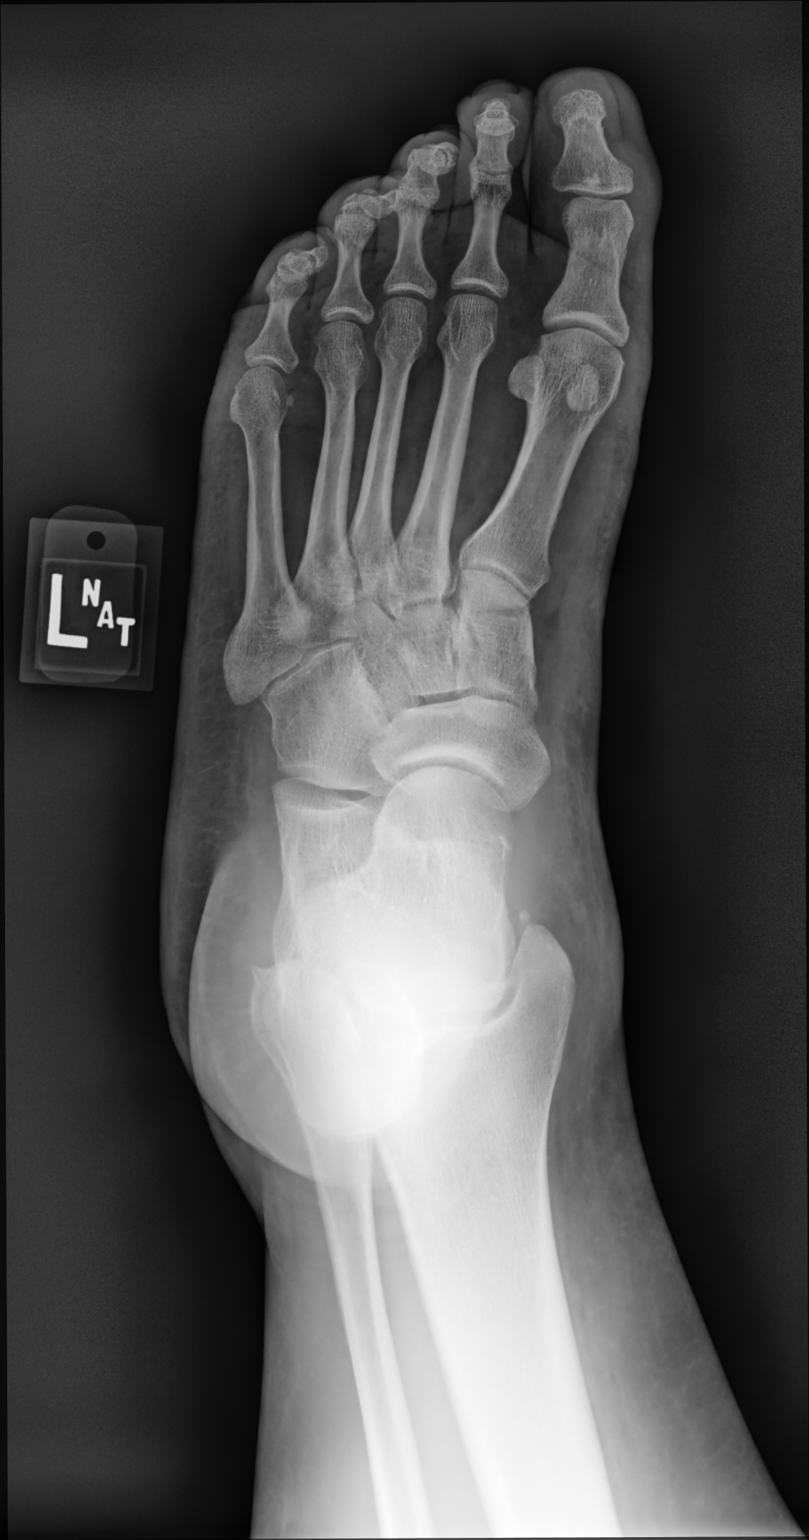

[foot medial oblique]
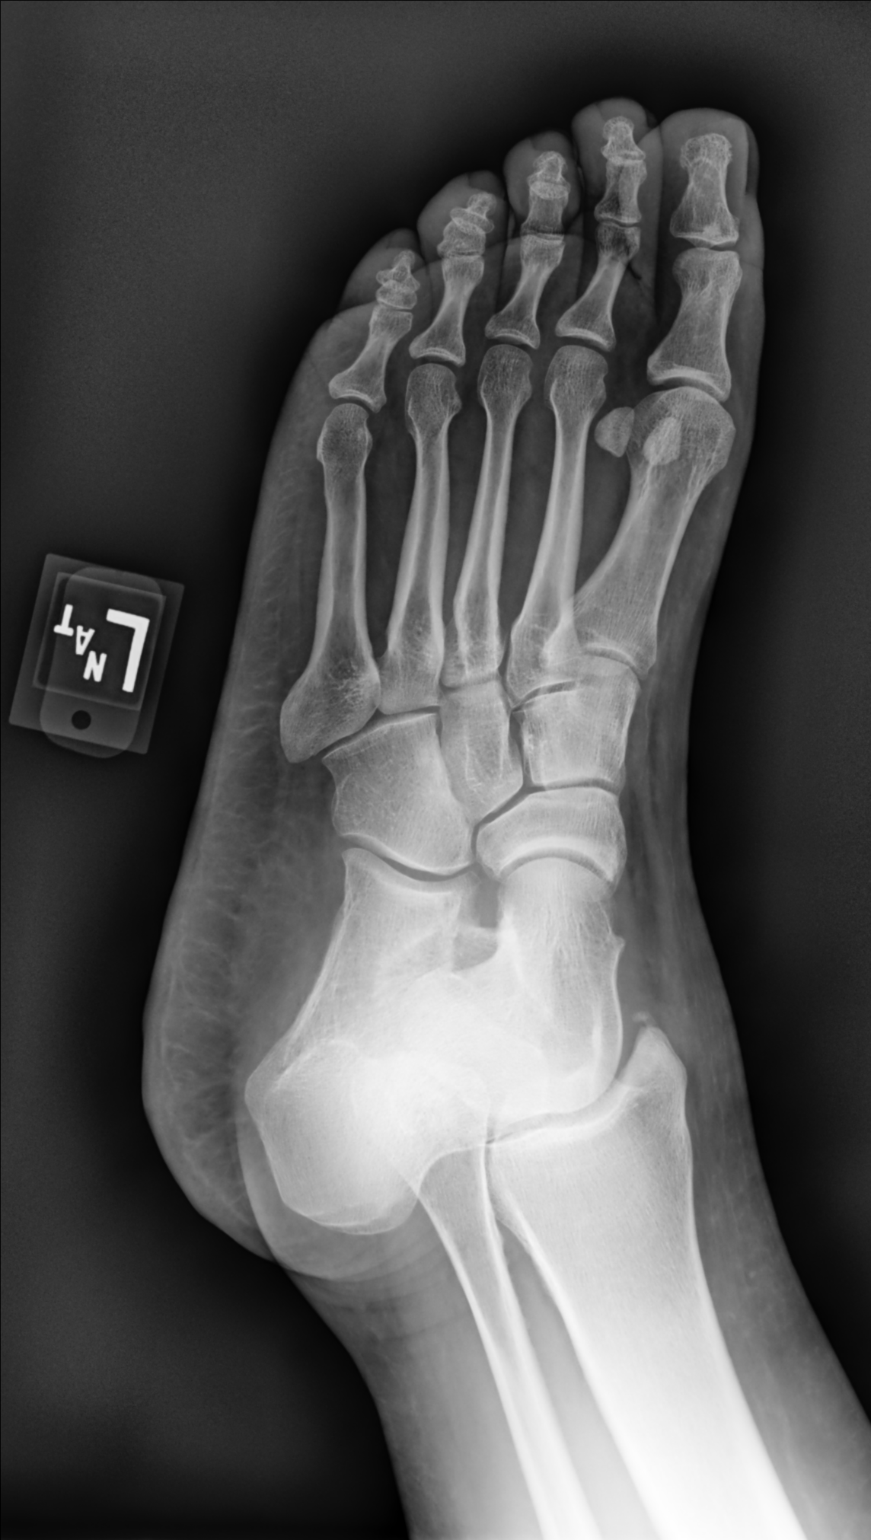

[foot supine lat]
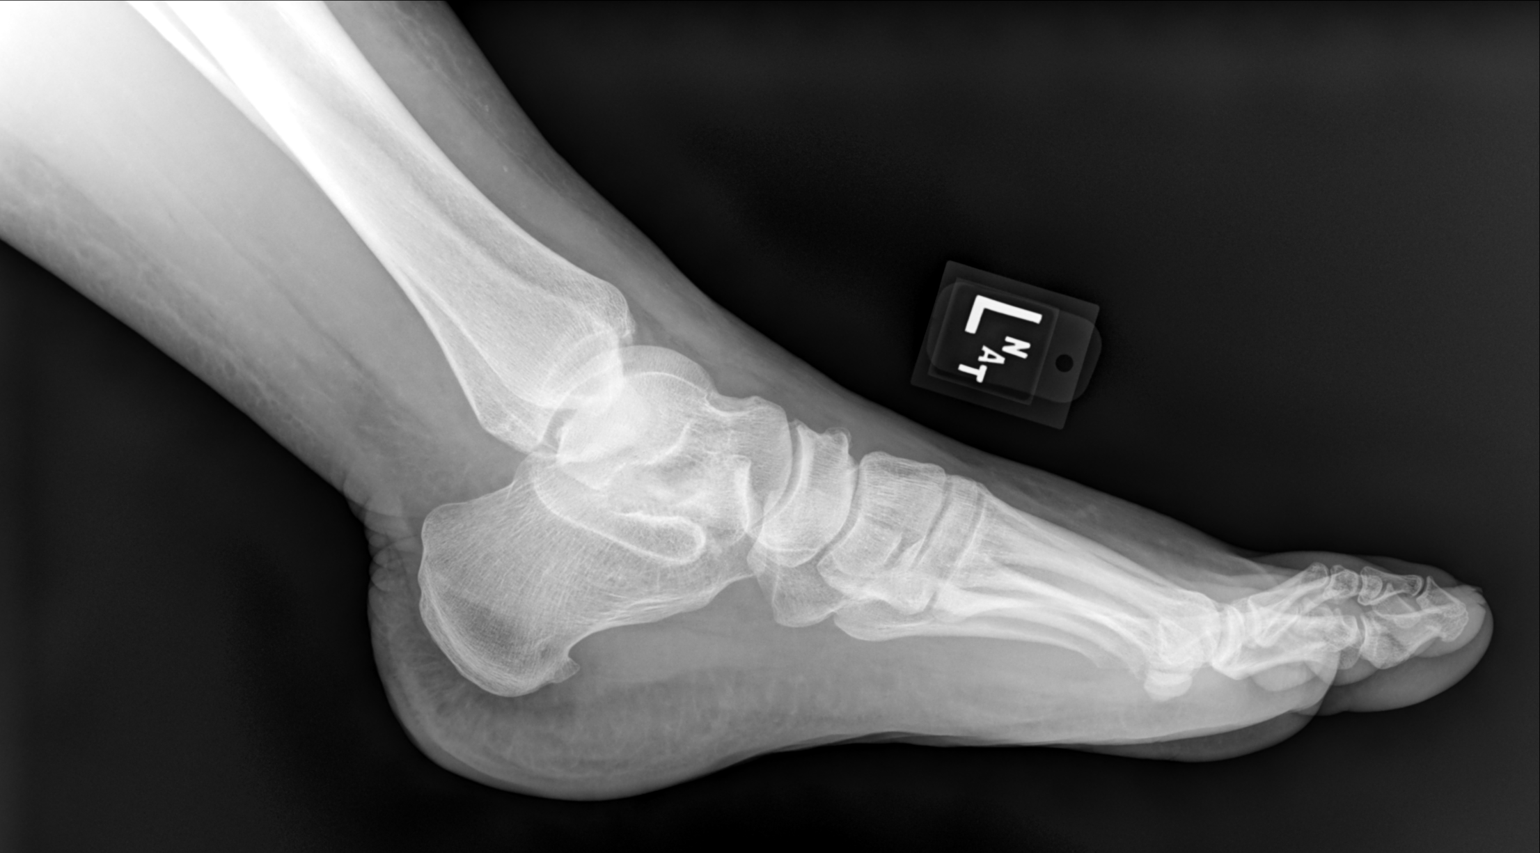

[3 of 3 positions shown; findings below may reference images not displayed]

FINDINGS: There is no evidence of fracture or dislocation. There is no
evidence of arthropathy or other focal bone abnormality. Soft tissue
edema about the forefoot.
IMPRESSION: No fracture or dislocation of the left foot. Soft tissue edema about
the forefoot.

## 2022-07-06 ENCOUNTER — Telehealth: Payer: Medicaid - Out of State | Admitting: Physician Assistant

## 2022-07-06 DIAGNOSIS — R062 Wheezing: Secondary | ICD-10-CM | POA: Diagnosis not present

## 2022-07-06 DIAGNOSIS — B9689 Other specified bacterial agents as the cause of diseases classified elsewhere: Secondary | ICD-10-CM

## 2022-07-06 DIAGNOSIS — J019 Acute sinusitis, unspecified: Secondary | ICD-10-CM

## 2022-07-07 ENCOUNTER — Other Ambulatory Visit (HOSPITAL_BASED_OUTPATIENT_CLINIC_OR_DEPARTMENT_OTHER): Payer: Self-pay | Admitting: Family Medicine

## 2022-07-07 DIAGNOSIS — K219 Gastro-esophageal reflux disease without esophagitis: Secondary | ICD-10-CM

## 2022-07-07 DIAGNOSIS — R000 Tachycardia, unspecified: Secondary | ICD-10-CM

## 2022-07-07 MED ORDER — AMOXICILLIN-POT CLAVULANATE 875-125 MG PO TABS: 1.0000 | ORAL_TABLET | Freq: Two times a day (BID) | ORAL | 0 refills | Status: DC

## 2022-07-07 MED ORDER — ALBUTEROL SULFATE HFA 108 (90 BASE) MCG/ACT IN AERS: 1.0000 | INHALATION_SPRAY | Freq: Four times a day (QID) | RESPIRATORY_TRACT | 0 refills | Status: DC | PRN

## 2022-07-07 NOTE — Progress Notes (Signed)
E-Visit for Sinus Problems  We are sorry that you are not feeling well.  Here is how we plan to help!  Based on what you have shared with me it looks like you have sinusitis.  Sinusitis is inflammation and infection in the sinus cavities of the head.  Based on your presentation I believe you most likely have Acute Bacterial Sinusitis.  This is an infection caused by bacteria and is treated with antibiotics. I have prescribed Augmentin 875mg /125mg  one tablet twice daily with food, for 7 days. I have also prescribed Albuterol inhlaer Use 1-2 puffs every 6 hours as needed for wheezing.  You may use an oral decongestant such as Mucinex D or if you have glaucoma or high blood pressure use plain Mucinex. Saline nasal spray help and can safely be used as often as needed for congestion.  If you develop worsening sinus pain, fever or notice severe headache and vision changes, or if symptoms are not better after completion of antibiotic, please schedule an appointment with a health care provider.    Sinus infections are not as easily transmitted as other respiratory infection, however we still recommend that you avoid close contact with loved ones, especially the very young and elderly.  Remember to wash your hands thoroughly throughout the day as this is the number one way to prevent the spread of infection!  Home Care: Only take medications as instructed by your medical team. Complete the entire course of an antibiotic. Do not take these medications with alcohol. A steam or ultrasonic humidifier can help congestion.  You can place a towel over your head and breathe in the steam from hot water coming from a faucet. Avoid close contacts especially the very young and the elderly. Cover your mouth when you cough or sneeze. Always remember to wash your hands.  Get Help Right Away If: You develop worsening fever or sinus pain. You develop a severe head ache or visual changes. Your symptoms persist after you  have completed your treatment plan.  Make sure you Understand these instructions. Will watch your condition. Will get help right away if you are not doing well or get worse.  Thank you for choosing an e-visit.  Your e-visit answers were reviewed by a board certified advanced clinical practitioner to complete your personal care plan. Depending upon the condition, your plan could have included both over the counter or prescription medications.  Please review your pharmacy choice. Make sure the pharmacy is open so you can pick up prescription now. If there is a problem, you may contact your provider through [employer] and have the prescription routed to another pharmacy.  Your safety is important to [place]. If you have drug allergies check your prescription carefully.   For the next 24 hours you can use MyChart to ask questions about today's visit, request a non-urgent call back, or ask for a work or school excuse. You will get an email in the next two days asking about your experience. I hope that your e-visit has been valuable and will speed your recovery.  I have spent 5 minutes in review of e-visit questionnaire, review and updating patient chart, medical decision making and response to patient.   Mar Daring, PA-C

## 2022-07-11 ENCOUNTER — Encounter (HOSPITAL_BASED_OUTPATIENT_CLINIC_OR_DEPARTMENT_OTHER): Payer: Self-pay | Admitting: Family Medicine

## 2022-07-11 ENCOUNTER — Other Ambulatory Visit: Payer: Self-pay

## 2022-07-11 ENCOUNTER — Other Ambulatory Visit (HOSPITAL_BASED_OUTPATIENT_CLINIC_OR_DEPARTMENT_OTHER): Payer: Self-pay | Admitting: Family Medicine

## 2022-07-11 ENCOUNTER — Ambulatory Visit: Payer: MEDICAID | Attending: Family Medicine | Admitting: Family Medicine

## 2022-07-11 VITALS — BP 130/72 | HR 96 | Temp 98.5°F | Ht 62.5 in | Wt 275.2 lb

## 2022-07-11 DIAGNOSIS — G4709 Other insomnia: Secondary | ICD-10-CM | POA: Insufficient documentation

## 2022-07-11 DIAGNOSIS — F419 Anxiety disorder, unspecified: Secondary | ICD-10-CM | POA: Insufficient documentation

## 2022-07-11 DIAGNOSIS — Z79899 Other long term (current) drug therapy: Secondary | ICD-10-CM | POA: Insufficient documentation

## 2022-07-11 DIAGNOSIS — K219 Gastro-esophageal reflux disease without esophagitis: Secondary | ICD-10-CM | POA: Insufficient documentation

## 2022-07-11 DIAGNOSIS — R000 Tachycardia, unspecified: Secondary | ICD-10-CM | POA: Insufficient documentation

## 2022-07-11 DIAGNOSIS — I10 Essential (primary) hypertension: Secondary | ICD-10-CM

## 2022-07-14 ENCOUNTER — Other Ambulatory Visit (HOSPITAL_BASED_OUTPATIENT_CLINIC_OR_DEPARTMENT_OTHER): Payer: Self-pay | Admitting: Family Medicine

## 2022-07-14 DIAGNOSIS — F419 Anxiety disorder, unspecified: Secondary | ICD-10-CM

## 2022-07-14 DIAGNOSIS — G4709 Other insomnia: Secondary | ICD-10-CM

## 2022-07-14 DIAGNOSIS — R000 Tachycardia, unspecified: Secondary | ICD-10-CM

## 2022-07-14 DIAGNOSIS — K219 Gastro-esophageal reflux disease without esophagitis: Secondary | ICD-10-CM

## 2022-07-14 MED ORDER — TRAZODONE 50 MG TABLET
50.0000 mg | ORAL_TABLET | Freq: Every evening | ORAL | 0 refills | Status: DC
Start: 2022-07-14 — End: 2022-07-14

## 2022-07-14 MED ORDER — METOPROLOL SUCCINATE ER 50 MG TABLET,EXTENDED RELEASE 24 HR
50.0000 mg | ORAL_TABLET | Freq: Every day | ORAL | 0 refills | Status: DC
Start: 2022-07-14 — End: 2022-09-12

## 2022-07-14 MED ORDER — PANTOPRAZOLE 20 MG TABLET,DELAYED RELEASE
20.0000 mg | DELAYED_RELEASE_TABLET | Freq: Two times a day (BID) | ORAL | 0 refills | Status: DC
Start: 2022-07-14 — End: 2022-09-12

## 2022-07-14 MED ORDER — METOPROLOL SUCCINATE ER 50 MG TABLET,EXTENDED RELEASE 24 HR
50.0000 mg | ORAL_TABLET | Freq: Every day | ORAL | 0 refills | Status: DC
Start: 2022-07-14 — End: 2022-07-14

## 2022-07-14 MED ORDER — BUPROPION HCL SR 150 MG TABLET,12 HR SUSTAINED-RELEASE
150.0000 mg | ORAL_TABLET | Freq: Every day | ORAL | 0 refills | Status: DC
Start: 2022-07-14 — End: 2022-07-14

## 2022-07-14 MED ORDER — BUPROPION HCL SR 150 MG TABLET,12 HR SUSTAINED-RELEASE
150.0000 mg | ORAL_TABLET | Freq: Every day | ORAL | 0 refills | Status: DC
Start: 2022-07-14 — End: 2022-10-24

## 2022-07-14 MED ORDER — PANTOPRAZOLE 20 MG TABLET,DELAYED RELEASE
20.0000 mg | DELAYED_RELEASE_TABLET | Freq: Two times a day (BID) | ORAL | 0 refills | Status: DC
Start: 2022-07-14 — End: 2022-07-14

## 2022-07-14 MED ORDER — TRAZODONE 50 MG TABLET
50.0000 mg | ORAL_TABLET | Freq: Every evening | ORAL | 0 refills | Status: DC
Start: 2022-07-14 — End: 2022-09-12

## 2022-07-14 NOTE — Telephone Encounter (Signed)
Pt needs refills Kristina Baxter Metoprolol and Protonix was in Friday and was to be sent she is out.   Thanks

## 2022-07-14 NOTE — Telephone Encounter (Signed)
Patient is requesting these medications be sent to [facility] in [place] not [place] thanks

## 2022-07-17 ENCOUNTER — Encounter (HOSPITAL_BASED_OUTPATIENT_CLINIC_OR_DEPARTMENT_OTHER): Payer: Self-pay | Admitting: Family Medicine

## 2022-07-17 NOTE — Progress Notes (Signed)
[facility], [place]  [place] [id number]  Operated by [facility]    Progress Note     Name: Kristina Baxter MRN:  [id number]   Date: 07/11/2022 Age: 32 y.o.       Chief Complaint: Medication follow up (Pt here today for medication follow up for increase in metoprolol and pantoprazole. Pt states she saw an improvement when taking pantoprazole twice a day. Pt is having a lot of sinus congestion and cough. She has tried multiple OTC medications for allergies. Kalen Ellifritz, MA/)    History of Present Illness:  Patient is seen today for follow-up to medication changes.  Patient had an increase in the metoprolol due to tachycardia and an increase in pantoprazole due to reflux symptoms.    Patient states that she has had an improvement in her reflux symptoms since increasing the pantoprazole to twice a day.  We have discussed GERD precautions.  I have asked the patient to identify foods that cause reflux and avoid.  She should also remain upright 30 minutes after eating and drinking.    Metoprolol was increased to  50 mg daily for tachycardia.  Patient's heart rate in office today is 96.  She states that she is not having any further episodes of increased heart rate or palpitations.    Patient is in need of medication refills on trazodone  and Wellbutrin and these are provided for her today.    [facility]  [facility] gave her Augmentin and Phenergan DM for symptoms.  She continues to have increased cold-like symptoms and a cough.  I have encouraged her to use OTC medications for symptom management.    Past Medical History  Past Medical History:   Diagnosis Date    Anxiety     Depression          Past Surgical History:   Procedure Laterality Date    DENTAL SURGERY      FOOT SURGERY Right          Current Outpatient Medications   Medication Sig    albuterol sulfate (PROVENTIL HFA) 90 mcg/actuation Inhalation oral  inhaler Take 1-2 Puffs by inhalation Every 6 hours as needed    amoxicillin-pot clavulanate (AUGMENTIN) 875-125 mg Oral Tablet Take 1 Tablet by mouth    buPROPion (WELLBUTRIN SR) 150 mg Oral tablet sustained-release 12 hr Take 1 Tablet (150 mg total) by mouth Once a day Indications: anxiousness associated with depression    Cetirizine 10 mg Oral Capsule Take 1 Capsule (10 mg total) by mouth    etonogestreL (NEXPLANON) 68 mg Subdermal Implant 68 mg by Subdermal route One time For subdermal insertion by health care providers trained in the insertion and removal procedure.    fluticasone propionate (FLONASE) 50 mcg/actuation Nasal Spray, Suspension Administer 1-2 Sprays into affected nostril(s)    hydrOXYzine pamoate (VISTARIL) 25 mg Oral Capsule TAKE 1 CAPSULE BY MOUTH EVERY 6 TO 8 HOURS AS NEEDED FOR ANXIETY AND FOR ITCHING    metoprolol succinate (TOPROL-XL) 50 mg Oral Tablet Sustained Release 24 hr Take 1 Tablet (50 mg total) by mouth Once a day for 90 days Indications: Tachycardia    oxymetazoline (AFRIN) 0.05 % Nasal Spray, Non-Aerosol Administer 1-2 Sprays into each nostril Twice per day as needed (NO MORE than 3 (THREE DAYS) of continuous use!) (Patient not taking: Reported on 07/11/2022)    pantoprazole (PROTONIX) 20 mg  Oral Tablet, Delayed Release (E.C.) Take 1 Tablet (20 mg total) by mouth Twice a day before meals for 90 days Indications: gastroesophageal reflux disease    traZODone (DESYREL) 50 mg Oral Tablet Take 1 Tablet (50 mg total) by mouth Every night Indications: insomnia associated with depression     Allergies   Allergen Reactions    Fluconazole Rash    Doxycycline Hives/ Urticaria    Fish Derived Nausea/ Vomiting    Klonopin [Clonazepam] Hives/ Urticaria    Minocycline Hives/ Urticaria     Social History     Tobacco Use   Smoking Status Every Day    Current packs/day: 0.25    Types: Cigarettes   Smokeless Tobacco Never       Family History  Family Medical History:       Problem Relation (Age of  Onset)    Alzheimer's/Dementia Maternal Grandmother    Cancer Maternal Grandfather    Diabetes Maternal Grandfather    Heart Attack Mother, Maternal Grandmother, Maternal Grandfather    Hypertension (High Blood Pressure) Mother, Maternal Grandmother, Maternal Grandfather    Lung disease Maternal Grandmother    No Known Problems Sister    Stroke Maternal Grandfather              Review of Systems  All pertinent Review of System as address in the HPI.    Review of Systems Reviewed    Imaging: No results were found from the past 30 days.    LABS REVIEWED  CBC  Diff   Lab Results   Component Value Date/Time    WBC 5.6 02/13/2022 07:41 AM    HGB 14.3 02/13/2022 07:41 AM    HCT 41.7 02/13/2022 07:41 AM    PLTCNT 216 02/13/2022 07:41 AM    RBC 4.40 02/13/2022 07:41 AM    MCV 94.8 02/13/2022 07:41 AM    MCHC 34.3 02/13/2022 07:41 AM    MCH 32.5 (H) 02/13/2022 07:41 AM    MPV 8.5 (L) 02/13/2022 07:41 AM    Lab Results   Component Value Date/Time    PMNS 60 02/13/2022 07:41 AM    MONOCYTES 7 02/13/2022 07:41 AM    BASOPHILS 1 02/13/2022 07:41 AM    BASOPHILS 0.04 02/13/2022 07:41 AM    PMNABS 3.39 02/13/2022 07:41 AM    LYMPHSABS 1.62 02/13/2022 07:41 AM    EOSABS 0.18 02/13/2022 07:41 AM    MONOSABS 0.38 02/13/2022 07:41 AM          COMPREHENSIVE METABOLIC PANEL   Lab Results   Component Value Date    SODIUM 139 02/13/2022    POTASSIUM 4.2 02/13/2022    CHLORIDE 109 02/13/2022    CO2 25 02/13/2022    ANIONGAP 5 02/13/2022    BUN 13 02/13/2022    CREATININE 0.83 02/13/2022    CALCIUM 9.0 02/13/2022    ALBUMIN 3.6 02/13/2022    TOTALPROTEIN 6.9 02/13/2022    ALKPHOS 67 02/13/2022    AST 36 02/13/2022    ALT 58 (H) 02/13/2022           Lab Results   Component Value Date    CHOLESTEROL 136 02/13/2022    HDLCHOL 37 (L) 02/13/2022    LDLCHOL 85 02/13/2022    TRIG 69 02/13/2022        No visits with results within 1 Month(s) from this visit.   Latest known visit with results is:   Office Visit on 04/12/2022   Component Date Value  Ref  Range Status    SARS-CoV-2 04/12/2022 Not Detected  Not Detected Final    INFLUENZA VIRUS TYPE A 04/12/2022 Not Detected  Not Detected Final    INFLUENZA VIRUS TYPE B 04/12/2022 Not Detected  Not Detected Final    RESPIRATORY SYNCTIAL VIRUS (RSV) 04/12/2022 Not Detected  Not Detected Final       The ASCVD Risk score (Arnett DK, et al., 2019) failed to calculate for the following reasons:    The 2019 ASCVD risk score is only valid for ages 27 to 47    Vitals:    07/11/22 1034   BP: 130/72   Pulse: 96   Temp: 36.9 C (98.5 F)   TempSrc: Tympanic   SpO2: 99%   Weight: 125 kg (275 lb 3.2 oz)   Height: 1.588 m (5' 2.5")   BMI: 49.64       Physical Examination:  Physical Exam  Vitals and nursing note reviewed.   Constitutional:       General: She is not in acute distress.     Appearance: Normal appearance. She is obese. She is not ill-appearing.   HENT:      Head: Normocephalic and atraumatic.      Right Ear: External ear normal.      Left Ear: External ear normal.      Nose: Nose normal.   Cardiovascular:      Rate and Rhythm: Normal rate and regular rhythm.      Pulses: Normal pulses.      Heart sounds: Normal heart sounds. No murmur heard.     No friction rub. No gallop.   Pulmonary:      Effort: Pulmonary effort is normal.      Breath sounds: Normal breath sounds. No wheezing, rhonchi or rales.   Musculoskeletal:         General: Normal range of motion.      Cervical back: Normal range of motion.   Skin:     General: Skin is warm and dry.   Neurological:      General: No focal deficit present.      Mental Status: She is alert and oriented to person, place, and time.   Psychiatric:         Mood and Affect: Mood normal.         Behavior: Behavior normal.          Procedures         Assessment and Plan  ENCOUNTER DIAGNOSES     ICD-10-CM   1. Gastroesophageal reflux disease without esophagitis  K21.9   2. Tachycardia  R00.0   3. Anxiety  F41.9   4. 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        (K21.9) Gastroesophageal reflux disease without esophagitis  Refill pantoprazole (PROTONIX) 20 mg         Oral Tablet, Delayed Release (E.C.)            (R00.0) Tachycardia  Refill metoprolol succinate (TOPROL-XL)         50 mg Oral Tablet Sustained Release 24 hr            (F41.9) Anxiety  Refill buPROPion (WELLBUTRIN SR) 150 mg         Oral tablet sustained-release 12 hr            (G47.09) Other insomnia  Refill traZODone (DESYREL) 50 mg Oral  Tablet              The patient was given the opportunity to ask questions and those questions were answered to the patient's satisfaction. The patient was encouraged to call with any additional questions or concerns.     There are no Patient Instructions on file for this visit.    Return in about 2 months (around 09/10/2022) for Routine follow-up with labs.    Deborra Medina, FNP-BC      This note may have been partially generated using [employer] Fluency Direct system, and there may be some incorrect words, spellings, and punctuation that were not noted in checking the note before saving.

## 2022-09-10 ENCOUNTER — Other Ambulatory Visit: Payer: MEDICAID | Attending: Family Medicine

## 2022-09-10 DIAGNOSIS — Z114 Encounter for screening for human immunodeficiency virus [HIV]: Secondary | ICD-10-CM

## 2022-09-10 DIAGNOSIS — R7989 Other specified abnormal findings of blood chemistry: Secondary | ICD-10-CM

## 2022-09-10 DIAGNOSIS — Z8349 Family history of other endocrine, nutritional and metabolic diseases: Secondary | ICD-10-CM

## 2022-09-10 DIAGNOSIS — Z1159 Encounter for screening for other viral diseases: Secondary | ICD-10-CM

## 2022-09-10 DIAGNOSIS — D509 Iron deficiency anemia, unspecified: Secondary | ICD-10-CM

## 2022-09-10 DIAGNOSIS — I10 Essential (primary) hypertension: Secondary | ICD-10-CM

## 2022-09-10 DIAGNOSIS — Z808 Family history of malignant neoplasm of other organs or systems: Secondary | ICD-10-CM

## 2022-09-10 LAB — CBC WITH DIFF
BASOPHIL #: <0.04 10*3/uL (ref <=0.20)
BASOPHIL %: 1 %
EOSINOPHIL #: 0.19 10*3/uL (ref <=0.50)
EOSINOPHIL %: 4 %
HCT: 43.5 % (ref 34.8–46.0)
HGB: 15.2 g/dL (ref 11.5–16.0)
IMMATURE GRANULOCYTE #: <0.04 10*3/uL (ref <0.10)
IMMATURE GRANULOCYTE %: 0 % (ref 0.0–1.0)
LYMPHOCYTE #: 1.58 10*3/uL (ref 1.00–4.80)
LYMPHOCYTE %: 32 %
MCH: 31.9 pg (ref 26.0–32.0)
MCHC: 34.9 g/dL (ref 31.0–35.5)
MCV: 91.2 fL (ref 78.0–100.0)
MONOCYTE #: 0.44 10*3/uL (ref 0.20–1.10)
MONOCYTE %: 9 %
MPV: 8.9 fL (ref 8.7–12.5)
NEUTROPHIL #: 2.72 10*3/uL (ref 1.50–7.70)
NEUTROPHIL %: 54 %
PLATELETS: 230 10*3/uL (ref 150–400)
RBC: 4.77 10*6/uL (ref 3.85–5.22)
RDW-CV: 11.4 % — ABNORMAL LOW (ref 11.5–15.5)
WBC: 5 10*3/uL (ref 3.7–11.0)

## 2022-09-10 LAB — COMPREHENSIVE METABOLIC PANEL, NON-FASTING
ALBUMIN: 3.8 g/dL (ref 3.5–5.0)
ALKALINE PHOSPHATASE: 81 U/L (ref 40–110)
ALT (SGPT): 30 U/L — ABNORMAL HIGH (ref 8–22)
ANION GAP: 6 mmol/L (ref 4–13)
AST (SGOT): 24 U/L (ref 8–45)
BILIRUBIN TOTAL: 0.5 mg/dL (ref 0.3–1.3)
BUN/CREA RATIO: 13 (ref 6–22)
BUN: 11 mg/dL (ref 8–25)
CALCIUM: 9.3 mg/dL (ref 8.6–10.2)
CHLORIDE: 108 mmol/L (ref 96–111)
CO2 TOTAL: 25 mmol/L (ref 22–30)
CREATININE: 0.86 mg/dL (ref 0.60–1.05)
ESTIMATED GFR - FEMALE: >90 mL/min/BSA (ref >=60)
GLUCOSE: 95 mg/dL (ref 65–125)
POTASSIUM: 3.8 mmol/L (ref 3.5–5.1)
PROTEIN TOTAL: 7.4 g/dL (ref 6.4–8.3)
SODIUM: 139 mmol/L (ref 136–145)

## 2022-09-10 LAB — LIPID PANEL
CHOL/HDL RATIO: 3.5
CHOLESTEROL: 121 mg/dL (ref 100–200)
HDL CHOL: 35 mg/dL — ABNORMAL LOW (ref >=50)
LDL CALC: 71 mg/dL (ref <100)
NON-HDL: 86 mg/dL (ref <=190)
TRIGLYCERIDES: 71 mg/dL (ref <150)
VLDL CALC: 11 mg/dL (ref <30)

## 2022-09-10 LAB — T3 (TRIIODOTHYRONINE), FREE, SERUM: T3 FREE: 3.1 pg/mL (ref 1.7–3.7)

## 2022-09-10 LAB — THYROID STIMULATING HORMONE (SENSITIVE TSH): TSH: 7.239 u[IU]/mL — ABNORMAL HIGH (ref 0.350–4.940)

## 2022-09-10 LAB — THYROXINE, FREE (FREE T4): THYROXINE (T4), FREE: 0.9 ng/dL (ref 0.70–1.48)

## 2022-09-12 ENCOUNTER — Ambulatory Visit: Payer: MEDICAID | Attending: Family Medicine | Admitting: Family Medicine

## 2022-09-12 ENCOUNTER — Encounter (HOSPITAL_BASED_OUTPATIENT_CLINIC_OR_DEPARTMENT_OTHER): Payer: Self-pay | Admitting: Family Medicine

## 2022-09-12 ENCOUNTER — Other Ambulatory Visit: Payer: Self-pay

## 2022-09-12 VITALS — BP 142/78 | HR 107 | Temp 98.6°F | Ht 62.5 in | Wt 275.4 lb

## 2022-09-12 DIAGNOSIS — K760 Fatty (change of) liver, not elsewhere classified: Secondary | ICD-10-CM | POA: Insufficient documentation

## 2022-09-12 DIAGNOSIS — R000 Tachycardia, unspecified: Secondary | ICD-10-CM | POA: Insufficient documentation

## 2022-09-12 DIAGNOSIS — F32A Depression, unspecified: Secondary | ICD-10-CM | POA: Insufficient documentation

## 2022-09-12 DIAGNOSIS — F419 Anxiety disorder, unspecified: Secondary | ICD-10-CM | POA: Insufficient documentation

## 2022-09-12 DIAGNOSIS — E039 Hypothyroidism, unspecified: Secondary | ICD-10-CM | POA: Insufficient documentation

## 2022-09-12 DIAGNOSIS — G4709 Other insomnia: Secondary | ICD-10-CM | POA: Insufficient documentation

## 2022-09-12 DIAGNOSIS — R7989 Other specified abnormal findings of blood chemistry: Secondary | ICD-10-CM | POA: Insufficient documentation

## 2022-09-12 DIAGNOSIS — F909 Attention-deficit hyperactivity disorder, unspecified type: Secondary | ICD-10-CM | POA: Insufficient documentation

## 2022-09-12 DIAGNOSIS — F17210 Nicotine dependence, cigarettes, uncomplicated: Secondary | ICD-10-CM | POA: Insufficient documentation

## 2022-09-12 DIAGNOSIS — Z79890 Hormone replacement therapy (postmenopausal): Secondary | ICD-10-CM | POA: Insufficient documentation

## 2022-09-12 DIAGNOSIS — I10 Essential (primary) hypertension: Secondary | ICD-10-CM

## 2022-09-12 DIAGNOSIS — Z6841 Body Mass Index (BMI) 40.0 and over, adult: Secondary | ICD-10-CM | POA: Insufficient documentation

## 2022-09-12 DIAGNOSIS — Z114 Encounter for screening for human immunodeficiency virus [HIV]: Secondary | ICD-10-CM | POA: Insufficient documentation

## 2022-09-12 DIAGNOSIS — Z862 Personal history of diseases of the blood and blood-forming organs and certain disorders involving the immune mechanism: Secondary | ICD-10-CM

## 2022-09-12 DIAGNOSIS — D509 Iron deficiency anemia, unspecified: Secondary | ICD-10-CM | POA: Insufficient documentation

## 2022-09-12 DIAGNOSIS — K219 Gastro-esophageal reflux disease without esophagitis: Secondary | ICD-10-CM

## 2022-09-12 DIAGNOSIS — Z79899 Other long term (current) drug therapy: Secondary | ICD-10-CM | POA: Insufficient documentation

## 2022-09-12 DIAGNOSIS — Z1159 Encounter for screening for other viral diseases: Secondary | ICD-10-CM | POA: Insufficient documentation

## 2022-09-12 LAB — CBC WITH DIFF
BASOPHIL #: 0.04 10*3/uL (ref <=0.20)
BASOPHIL %: 1 %
EOSINOPHIL #: 0.15 10*3/uL (ref <=0.50)
EOSINOPHIL %: 2 %
HCT: 44 % (ref 34.8–46.0)
HGB: 15.3 g/dL (ref 11.5–16.0)
IMMATURE GRANULOCYTE #: <0.04 10*3/uL (ref <0.10)
IMMATURE GRANULOCYTE %: 0 % (ref 0.0–1.0)
LYMPHOCYTE #: 1.67 10*3/uL (ref 1.00–4.80)
LYMPHOCYTE %: 27 %
MCH: 31.7 pg (ref 26.0–32.0)
MCHC: 34.8 g/dL (ref 31.0–35.5)
MCV: 91.1 fL (ref 78.0–100.0)
MONOCYTE #: 0.41 10*3/uL (ref 0.20–1.10)
MONOCYTE %: 7 %
MPV: 8.7 fL (ref 8.7–12.5)
NEUTROPHIL #: 3.94 10*3/uL (ref 1.50–7.70)
NEUTROPHIL %: 63 %
PLATELETS: 260 10*3/uL (ref 150–400)
RBC: 4.83 10*6/uL (ref 3.85–5.22)
RDW-CV: 11.4 % — ABNORMAL LOW (ref 11.5–15.5)
WBC: 6.2 10*3/uL (ref 3.7–11.0)

## 2022-09-12 LAB — HEPATITIS C ANTIBODY SCREEN WITH REFLEX TO HCV PCR: HCV ANTIBODY QUALITATIVE: NEGATIVE

## 2022-09-12 LAB — URINALYSIS, MACROSCOPIC
BILIRUBIN: NEGATIVE mg/dL
BLOOD: NEGATIVE mg/dL
GLUCOSE: NEGATIVE mg/dL
KETONES: NEGATIVE mg/dL
LEUKOCYTES: NEGATIVE WBCs/uL
NITRITE: NEGATIVE
PH: 5.5 (ref 5.0–8.0)
PROTEIN: NEGATIVE mg/dL
SPECIFIC GRAVITY: >=1.03 (ref 1.005–1.030)
UROBILINOGEN: 0.2 mg/dL (ref 0.2–1.0)

## 2022-09-12 LAB — FERRITIN: FERRITIN: 102 ng/mL (ref 5–200)

## 2022-09-12 LAB — MICROALBUMIN/CREATININE RATIO, URINE, RANDOM
CREATININE RANDOM URINE: 174 mg/dL
MICROALBUMIN RANDOM URINE: 0.5 mg/dL
MICROALBUMIN/CREATININE RATIO RANDOM URINE: 2.9 mg/g (ref <30.0)

## 2022-09-12 LAB — IRON TRANSFERRIN AND TIBC
IRON (TRANSFERRIN) SATURATION: 46 % (ref 15–50)
IRON: 141 ug/dL (ref 45–170)
TOTAL IRON BINDING CAPACITY: 305 ug/dL (ref 252–504)
TRANSFERRIN: 218 mg/dL (ref 180–360)

## 2022-09-12 LAB — URINALYSIS, MICROSCOPIC

## 2022-09-12 LAB — THYROXINE, FREE (FREE T4): THYROXINE (T4), FREE: 0.9 ng/dL (ref 0.70–1.48)

## 2022-09-12 LAB — T3 (TRIIODOTHYRONINE), FREE, SERUM: T3 FREE: 3.1 pg/mL (ref 1.7–3.7)

## 2022-09-12 LAB — THYROID STIMULATING HORMONE (SENSITIVE TSH): TSH: 5.371 u[IU]/mL — ABNORMAL HIGH (ref 0.350–4.940)

## 2022-09-12 LAB — HIV1/HIV2 SCREEN, COMBINED ANTIGEN AND ANTIBODY: HIV SCREEN, COMBINED ANTIGEN & ANTIBODY: NEGATIVE

## 2022-09-12 MED ORDER — METOPROLOL SUCCINATE ER 25 MG TABLET,EXTENDED RELEASE 24 HR
25.0000 mg | ORAL_TABLET | Freq: Every day | ORAL | 4 refills | Status: DC
Start: 2022-09-12 — End: 2023-09-21

## 2022-09-12 MED ORDER — PANTOPRAZOLE 20 MG TABLET,DELAYED RELEASE
20.0000 mg | DELAYED_RELEASE_TABLET | Freq: Two times a day (BID) | ORAL | 0 refills | Status: DC
Start: 2022-09-12 — End: 2022-10-27

## 2022-09-12 MED ORDER — TRAZODONE 50 MG TABLET
50.0000 mg | ORAL_TABLET | Freq: Every evening | ORAL | 0 refills | Status: DC
Start: 2022-09-12 — End: 2023-02-09

## 2022-09-12 MED ORDER — METOPROLOL SUCCINATE ER 50 MG TABLET,EXTENDED RELEASE 24 HR
50.0000 mg | ORAL_TABLET | Freq: Every day | ORAL | 0 refills | Status: DC
Start: 2022-09-12 — End: 2023-01-01

## 2022-09-15 LAB — THYROPEROXIDASE (TPO) ANTIBODIES, SERUM: ANTI THYROPEROXIDASE ANTIBODIES: 12 IU/mL (ref <=14)

## 2022-09-16 ENCOUNTER — Telehealth (HOSPITAL_BASED_OUTPATIENT_CLINIC_OR_DEPARTMENT_OTHER): Payer: Self-pay | Admitting: Family Medicine

## 2022-09-16 DIAGNOSIS — D509 Iron deficiency anemia, unspecified: Secondary | ICD-10-CM | POA: Insufficient documentation

## 2022-09-16 DIAGNOSIS — Z862 Personal history of diseases of the blood and blood-forming organs and certain disorders involving the immune mechanism: Secondary | ICD-10-CM | POA: Insufficient documentation

## 2022-09-16 DIAGNOSIS — E039 Hypothyroidism, unspecified: Secondary | ICD-10-CM | POA: Insufficient documentation

## 2022-09-16 DIAGNOSIS — Z1159 Encounter for screening for other viral diseases: Secondary | ICD-10-CM | POA: Insufficient documentation

## 2022-09-16 MED ORDER — LEVOTHYROXINE 25 MCG TABLET
25.0000 ug | ORAL_TABLET | Freq: Every morning | ORAL | 1 refills | Status: DC
Start: 2022-09-16 — End: 2022-10-24

## 2022-09-16 NOTE — Telephone Encounter (Signed)
Returned pt's call regarding medications. Informed her that the provider has put in an Rx for levothyroxine 25 mcg and she is to take that in the AM 30 minutes before she eats with water only. Also let the pt know that her iron levels are fine and that she can continue taking her OTC iron supplements as before. She is to repeat TSH in 4 weeks. Pt stated that she will not return from [place] until 6 wks. Let her know that's fine and she was transferred to the front to schedule lab appt.

## 2022-09-16 NOTE — Telephone Encounter (Signed)
Patient looked at her lab results and reported that her and Rosey Bath and discussed a possible increase in medication, she is leaving town at [employer] and going back to [place] she just wanted to see what Rosey Bath had decided

## 2022-09-16 NOTE — Progress Notes (Signed)
[facility]  [address] [place] [address]  Operated by [facility]    Progress Note     Name: Kristina Baxter MRN:  [id number]   Date: 09/12/2022 Age: 32 y.o.       Chief Complaint: Follow Up (Pt here today for 2 mth follow up w/labs. Pt states that she discontinued the Wellbutrin due to weight gain. Pt would like to try a different medication for depression and anxiety. Pt is concerned about thyriod labs. She would also like medication for ADHD. She states that she was on vyvanse when she was younger and had to stop taking it due to insurance issues. Kalen Ellifritz, MA/)    History of Present Illness:  Patient presents today for a routine follow-up with labs.     BP is elevated today at 142/78. HR is also elevated at 107. Patient was recently seen by cardiology for tachycardia and there were no changes. She will follow-up in 6 months. I will increase metoprolol to 75 mg daily and have patient follow-up in 4 weeks for changes.     Patient's TSH is noted to be elevated at 7.239. Repeat showed 5.371. T4, T3 and TPO were normal. Will start patient on levothyroxine 25 mcg daily and follow-up TSH in 4 weeks.     Patient has a stated history of iron deficiency anemia. She is taking OTC iron supplements. Her CBC is normal.     Patient's liver functions show an elevated ALT. She has a documented history of fatty liver disease.     Patient has a history of anxiety, depression and ADHD. She was previously in Vyvanse when she was young, but stopped the medication due to insurance issues. She was stable on Wellbutrin but stopped the medication due to recent weight gain. She would like to have a medication that will not contribute to weight gain and would like to resume ADHD medications. Patient will be referred to behavioral health for further evaluation and management.     Patient further discusses her weight gain. According to her chart, she has gained about 17 pounds in 3 months.  She inquires about GLP-1 injections. I explain that her insurance will most likely not cover this. Will refer her to weight loss management [place].       Past Medical History  Past Medical History:   Diagnosis Date    Anxiety     Depression          Past Surgical History:   Procedure Laterality Date    DENTAL SURGERY      FOOT SURGERY Right          Current Outpatient Medications   Medication Sig    albuterol sulfate (PROVENTIL HFA) 90 mcg/actuation Inhalation oral inhaler Take 1-2 Puffs by inhalation Every 6 hours as needed    buPROPion (WELLBUTRIN SR) 150 mg Oral tablet sustained-release 12 hr Take 1 Tablet (150 mg total) by mouth Once a day Indications: anxiousness associated with depression (Patient not taking: Reported on 09/12/2022)    Cetirizine 10 mg Oral Capsule Take 1 Capsule (10 mg total) by mouth (Patient not taking: Reported on 09/12/2022)    etonogestreL (NEXPLANON) 68 mg Subdermal Implant 68 mg by Subdermal route One time For subdermal insertion by health care providers trained in the insertion and removal procedure.    fluticasone propionate (FLONASE) 50 mcg/actuation Nasal Spray, Suspension Administer 1-2 Sprays into affected nostril(s)    hydrOXYzine pamoate (VISTARIL) 25 mg  Oral Capsule TAKE 1 CAPSULE BY MOUTH EVERY 6 TO 8 HOURS AS NEEDED FOR ANXIETY AND FOR ITCHING    levothyroxine (SYNTHROID) 25 mcg Oral Tablet Take 1 Tablet (25 mcg total) by mouth Every morning Indications: a condition with low thyroid hormone levels    metoprolol succinate (TOPROL-XL) 25 mg Oral Tablet Sustained Release 24 hr Take 1 Tablet (25 mg total) by mouth Once a day Take with 50 mg to equal 75 mg daily    metoprolol succinate (TOPROL-XL) 50 mg Oral Tablet Sustained Release 24 hr Take 1 Tablet (50 mg total) by mouth Once a day for 90 days Take with 25 mg to equal 75 mg daily Indications: Tachycardia    nicotine (NICODERM CQ) 21 mg/24 hr Transdermal Patch 24 hr Place 1 Patch (21 mg total) on the skin Every 24 hours     oxymetazoline (AFRIN) 0.05 % Nasal Spray, Non-Aerosol Administer 1-2 Sprays into each nostril Twice per day as needed (NO MORE than 3 (THREE DAYS) of continuous use!) (Patient not taking: Reported on 07/11/2022)    pantoprazole (PROTONIX) 20 mg Oral Tablet, Delayed Release (E.C.) Take 1 Tablet (20 mg total) by mouth Twice a day before meals for 90 days Indications: gastroesophageal reflux disease    traZODone (DESYREL) 50 mg Oral Tablet Take 1 Tablet (50 mg total) by mouth Every night Indications: insomnia associated with depression     Allergies   Allergen Reactions    Fluconazole Rash    Doxycycline Hives/ Urticaria    Fish Derived Nausea/ Vomiting    Klonopin [Clonazepam] Hives/ Urticaria    Minocycline Hives/ Urticaria     Social History     Tobacco Use   Smoking Status Every Day    Current packs/day: 0.25    Average packs/day: 0.3 packs/day for 16.4 years (4.1 ttl pk-yrs)    Types: Cigarettes    Start date: 2008   Smokeless Tobacco Never       Family History  Family Medical History:       Problem Relation (Age of Onset)    Alzheimer's/Dementia Maternal Grandmother    Cancer Maternal Grandfather    Diabetes Maternal Grandfather    Heart Attack Mother, Maternal Grandmother, Maternal Grandfather    Hypertension (High Blood Pressure) Mother, Maternal Grandmother, Maternal Grandfather    Lung disease Maternal Grandmother    No Known Problems Sister    Stroke Maternal Grandfather    Thyroid Cancer Maternal Aunt              Review of Systems  All pertinent Review of System as address in the HPI.    Review of Systems Reviewed    Imaging: No results were found from the past 30 days.    LABS REVIEWED  CBC  Diff   Lab Results   Component Value Date/Time    WBC 6.2 09/12/2022 11:11 AM    HGB 15.3 09/12/2022 11:11 AM    HCT 44.0 09/12/2022 11:11 AM    PLTCNT 260 09/12/2022 11:11 AM    RBC 4.83 09/12/2022 11:11 AM    MCV 91.1 09/12/2022 11:11 AM    MCHC 34.8 09/12/2022 11:11 AM    MCH 31.7 09/12/2022 11:11 AM    MPV 8.7  09/12/2022 11:11 AM    Lab Results   Component Value Date/Time    PMNS 63.0 09/12/2022 11:11 AM    MONOCYTES 7.0 09/12/2022 11:11 AM    BASOPHILS 1.0 09/12/2022 11:11 AM    BASOPHILS 0.04 09/12/2022 11:11 AM  PMNABS 3.94 09/12/2022 11:11 AM    LYMPHSABS 1.67 09/12/2022 11:11 AM    EOSABS 0.15 09/12/2022 11:11 AM    MONOSABS 0.41 09/12/2022 11:11 AM          COMPREHENSIVE METABOLIC PANEL   Lab Results   Component Value Date    SODIUM 139 09/10/2022    POTASSIUM 3.8 09/10/2022    CHLORIDE 108 09/10/2022    CO2 25 09/10/2022    ANIONGAP 6 09/10/2022    BUN 11 09/10/2022    CREATININE 0.86 09/10/2022    CALCIUM 9.3 09/10/2022    ALBUMIN 3.8 09/10/2022    TOTALPROTEIN 7.4 09/10/2022    ALKPHOS 81 09/10/2022    AST 24 09/10/2022    ALT 30 (H) 09/10/2022           Lab Results   Component Value Date    CHOLESTEROL 121 09/10/2022    HDLCHOL 35 (L) 09/10/2022    LDLCHOL 71 09/10/2022    TRIG 71 09/10/2022        Appointment on 09/10/2022   Component Date Value Ref Range Status    SODIUM 09/10/2022 139  136 - 145 mmol/L Final    POTASSIUM 09/10/2022 3.8  3.5 - 5.1 mmol/L Final    CHLORIDE 09/10/2022 108  96 - 111 mmol/L Final    CO2 TOTAL 09/10/2022 25  22 - 30 mmol/L Final    ANION GAP 09/10/2022 6  4 - 13 mmol/L Final    BUN 09/10/2022 11  8 - 25 mg/dL Final    CREATININE 81/19/1478 0.86  0.60 - 1.05 mg/dL Final    BUN/CREA RATIO 09/10/2022 13  6 - 22 Final    ALBUMIN 09/10/2022 3.8  3.5 - 5.0 g/dL  Final    CALCIUM 29/56/2130 9.3  8.6 - 10.2 mg/dL Final    Gadolinium-containing contrast can interfere with calcium measurement.      GLUCOSE 09/10/2022 95  65 - 125 mg/dL Final    ALKALINE PHOSPHATASE 09/10/2022 81  40 - 110 U/L Final    ALT (SGPT) 09/10/2022 30 (H)  8 - 22 U/L Final    AST (SGOT)  09/10/2022 24  8 - 45 U/L Final    BILIRUBIN TOTAL 09/10/2022 0.5  0.3 - 1.3 mg/dL Final    Naproxen therapy can falsely elevate total bilirubin levels.    PROTEIN TOTAL 09/10/2022 7.4  6.4 - 8.3 g/dL Final    ESTIMATED GFR -  FEMALE 09/10/2022 >90  >=60 mL/min/BSA Final    Estimated Glomerular Filtration Rate (eGFR) is calculated using the gender-dependent CKD-EPI (2021) equation, intended for patients 36 years of age and older.    If patient sex is not documented, or if patient sex and gender are incongruent, eGFR results calculated for both female and female will be reported. Recommend correlation and careful interpretation of patient history, keeping in mind that serum creatinine is influenced by hormone therapy.    Stage, GFR, Classification   G1, 90, Normal or High   G2, 60-89, Mildly decreased   G3a, 45-59, Mildly to moderately decreased   G3b, 30-44, Moderately to severely decreased   G4, 15-29, Severely decreased   G5, <15, Kidney failure   In the absence of kidney damage, neither G1 or G2 fulfill criteria for CKD per KDIGO.      CHOLESTEROL  09/10/2022 121  100 - 200 mg/dL Final    HDL CHOL 86/57/8469 35 (L)  >=50 mg/dL Final    TRIGLYCERIDES 09/10/2022 71  <150  mg/dL Final    LDL CALC 54/12/8117 71  <100 mg/dL Final    <147 mg/dL, Optimal  829-562 mg/dL, Near/Above Optimal  130-865 mg/dL, Borderline High  784-696 mg/dL, High  >=295 mg/dL, Very high    VLDL CALC 09/10/2022 11  <30 mg/dL Final    NON-HDL 28/41/3244 86  <=190 mg/dL Final    CHOL/HDL RATIO 09/10/2022 3.5   Final    CREATININE RANDOM URINE 09/12/2022 174  No reference intervals are established mg/dL Final    Urine creatinine is used to adjust for specimen concentration or dilution. It does not necessarily speak to abnormality in a random urine specimen.      MICROALBUMIN RANDOM URINE 09/12/2022 0.5  No reference intervals are established mg/dL Final    MICROALBUMIN/CREATININE RATIO RAND* 09/12/2022 2.9  <30.0 mg/g Final    Microalbuminuria, 30 - 299 mg/g  Clinical albuminuria, >=300 mg/g    TSH 09/10/2022 7.239 (H)  0.350 - 4.940 uIU/mL Final    Pregnant patients have trimester-specific ranges derived from this analyzer/technology. For singleton pregnancies:    First  trimester, 0.15 - 2.45 uIU/mL  Second trimester, 0.35 - 3.20 uIU/mL  Third trimester, 0.40 - 4.30 uIU/mL    THYROXINE (T4), FREE 09/10/2022 0.90  0.70 - 1.48 ng/dL Final    Pregnant patients have trimester-specific reference intervals derived from a meta-analysis (Osinga et al, 2022):    First trimester: 0.85-1.45 ng/dL  Second trimester: 0.10-2.72 ng/dL  Third trimester: 5.36-6.44 ng/dL      T3 FREE 03/47/4259 3.1  1.7 - 3.7 pg/mL Final    WBC 09/10/2022 5.0  3.7 - 11.0 x10^3/uL Final    RBC 09/10/2022 4.77  3.85 - 5.22 x10^6/uL Final    HGB 09/10/2022 15.2  11.5 - 16.0 g/dL Final    HCT 56/38/7564 43.5  34.8 - 46.0 % Final    MCV 09/10/2022 91.2  78.0 - 100.0 fL Final    MCH 09/10/2022 31.9  26.0 - 32.0 pg Final    MCHC 09/10/2022 34.9  31.0 - 35.5 g/dL Final    RDW-CV 33/29/5188 11.4 (L)  11.5 - 15.5 % Final    PLATELETS 09/10/2022 230  150 - 400 x10^3/uL Final    MPV 09/10/2022 8.9  8.7 - 12.5 fL Final    NEUTROPHIL % 09/10/2022 54.0  % Final    LYMPHOCYTE % 09/10/2022 32.0  % Final    MONOCYTE % 09/10/2022 9.0  % Final    EOSINOPHIL % 09/10/2022 4.0  % Final    BASOPHIL % 09/10/2022 1.0  % Final    NEUTROPHIL # 09/10/2022 2.72  1.50 - 7.70 x10^3/uL Final    LYMPHOCYTE # 09/10/2022 1.58  1.00 - 4.80 x10^3/uL Final    MONOCYTE # 09/10/2022 0.44  0.20 - 1.10 x10^3/uL Final    EOSINOPHIL # 09/10/2022 0.19  <=0.50 x10^3/uL Final    BASOPHIL # 09/10/2022 <0.04  <=0.20 x10^3/uL Final    IMMATURE GRANULOCYTE % 09/10/2022 0.0  0.0 - 1.0 % Final    The immature granulocyte fraction (IGF) quantifies total circulating myelocytes, metamyelocytes, and promyelocytes. It is used to evaluate immune responses to infection, inflammation, or other stimuli of the bone marrow. Caution is advised in interpreting test results in neonates who normally have greater numbers of circulating immature blood cells.      IMMATURE GRANULOCYTE # 09/10/2022 <0.04  <0.10 x10^3/uL Final    COLOR 09/12/2022 Yellow   Final    APPEARANCE 09/12/2022  Clear   Final  SPECIFIC GRAVITY 09/12/2022 >=1.030  1.005 - 1.030 Final    PH 09/12/2022 5.5  5.0 - 8.0 Final    LEUKOCYTES 09/12/2022 Negative  Negative WBCs/uL Final    NITRITE 09/12/2022 Negative  Negative Final    PROTEIN 09/12/2022 Negative  Negative mg/dL Final    GLUCOSE 04/54/0981 Negative  Negative mg/dL Final    KETONES 19/14/7829 Negative  Negative mg/dL Final    UROBILINOGEN 56/21/3086 0.2  0.2 - 1.0 mg/dL Final    BILIRUBIN 57/84/6962 Negative  Negative mg/dL Final    BLOOD 95/28/4132 Negative  Negative mg/dL Final    RBCS 44/04/270 0-2  Occasional or less, 0-2, 1-3, None /hpf Final    WBCS 09/12/2022 Occasional or less  Occasional or less, 0-2, 1-3, None /hpf Final    BACTERIA 09/12/2022 Trace (A)  None /hpf Final    SQUAMOUS EPITHELIAL 09/12/2022 Several  /hpf Final    TSH 09/12/2022 5.371 (H)  0.350 - 4.940 uIU/mL Final    Pregnant patients have trimester-specific ranges derived from this analyzer/technology. For singleton pregnancies:    First trimester, 0.15 - 2.45 uIU/mL  Second trimester, 0.35 - 3.20 uIU/mL  Third trimester, 0.40 - 4.30 uIU/mL    T3 FREE 09/12/2022 3.1  1.7 - 3.7 pg/mL Final    THYROXINE (T4), FREE 09/12/2022 0.90  0.70 - 1.48 ng/dL Final    Pregnant patients have trimester-specific reference intervals derived from a meta-analysis (Osinga et al, 2022):    First trimester: 0.85-1.45 ng/dL  Second trimester: 5.36-6.44 ng/dL  Third trimester: 0.34-7.42 ng/dL      ANTI THYROPEROXIDASE ANTIBODIES 09/12/2022 12  <=14 IU/mL Final    HCV ANTIBODY QUALITATIVE 09/12/2022 Negative  Negative Final    Hepatitis Serology Methods = Chemiluminescent immunoassay performed on [employer] equipment    HIV SCREEN, COMBINED ANTIGEN & ANT* 09/12/2022 Negative  Negative Final    HIV serology screening follows CDC guidance. Reactive HIV combo screens are performed with chemiluminescent immunoassay ([employer]); the screening test is for p24 antigen and HIV-1/-2 antibodies, detected in a single qualitative  assessment. Reactive screens reflex automatically to antibody differentiation testing ([employer] lateral flow immunoassay). If a negative antibody differentiation test occurs, presence of p24 (i.e., acute HIV infection prior to seroconversion) OR false-positive screen results are possible. PCR testing is suggested in these instances, to adjudicate the discrepancy.    Note: for patients <2 yrs of age, it is recommended that an "HIV-1 Proviral DNA by PCR" be ordered to rule out the presence of HIV1/2 antigen or antibody from maternal origin.    FERRITIN 09/12/2022 102  5 - 200 ng/mL Final    Circulating ferritin <10 ng/mL suggests iron deficiency anemia.    TOTAL IRON BINDING CAPACITY 09/12/2022 305  252 - 504 ug/dL Final    IRON (TRANSFERRIN) SATURATION 09/12/2022 46  15 - 50 % Final    IRON 09/12/2022 141  45 - 170 ug/dL Final    TRANSFERRIN 59/56/3875 218  180 - 360 mg/dL Final    WBC 64/33/2951 6.2  3.7 - 11.0 x10^3/uL Final    RBC 09/12/2022 4.83  3.85 - 5.22 x10^6/uL Final    HGB 09/12/2022 15.3  11.5 - 16.0 g/dL Final    HCT 88/41/6606 44.0  34.8 - 46.0 % Final    MCV 09/12/2022 91.1  78.0 - 100.0 fL Final    MCH 09/12/2022 31.7  26.0 - 32.0 pg Final    MCHC 09/12/2022 34.8  31.0 - 35.5 g/dL Final    RDW-CV  09/12/2022 11.4 (L)  11.5 - 15.5 % Final    PLATELETS 09/12/2022 260  150 - 400 x10^3/uL Final    MPV 09/12/2022 8.7  8.7 - 12.5 fL Final    NEUTROPHIL % 09/12/2022 63.0  % Final    LYMPHOCYTE % 09/12/2022 27.0  % Final    MONOCYTE % 09/12/2022 7.0  % Final    EOSINOPHIL % 09/12/2022 2.0  % Final    BASOPHIL % 09/12/2022 1.0  % Final    NEUTROPHIL # 09/12/2022 3.94  1.50 - 7.70 x10^3/uL Final    LYMPHOCYTE # 09/12/2022 1.67  1.00 - 4.80 x10^3/uL Final    MONOCYTE # 09/12/2022 0.41  0.20 - 1.10 x10^3/uL Final    EOSINOPHIL # 09/12/2022 0.15  <=0.50 x10^3/uL Final    BASOPHIL # 09/12/2022 0.04  <=0.20 x10^3/uL Final    IMMATURE GRANULOCYTE % 09/12/2022 0.0  0.0 - 1.0 % Final    The immature granulocyte  fraction (IGF) quantifies total circulating myelocytes, metamyelocytes, and promyelocytes. It is used to evaluate immune responses to infection, inflammation, or other stimuli of the bone marrow. Caution is advised in interpreting test results in neonates who normally have greater numbers of circulating immature blood cells.      IMMATURE GRANULOCYTE # 09/12/2022 <0.04  <0.10 x10^3/uL Final       The ASCVD Risk score (Arnett DK, et al., 2019) failed to calculate for the following reasons:    The 2019 ASCVD risk score is only valid for ages 63 to 51    Vitals:    09/12/22 1013   BP: (!) 142/78   Pulse: (!) 107   Temp: 37 C (98.6 F)   TempSrc: Tympanic   SpO2: 97%   Weight: 125 kg (275 lb 6.4 oz)   Height: 1.588 m (5' 2.5")   BMI: 49.67       Physical Examination:  Physical Exam  Vitals and nursing note reviewed.   Constitutional:       Appearance: Normal appearance. She is obese.   HENT:      Head: Normocephalic and atraumatic.      Right Ear: External ear normal.      Left Ear: External ear normal.      Nose: Nose normal.   Cardiovascular:      Rate and Rhythm: Normal rate and regular rhythm.      Pulses: Normal pulses.      Heart sounds: Normal heart sounds.   Pulmonary:      Effort: Pulmonary effort is normal.      Breath sounds: Normal breath sounds.   Musculoskeletal:         General: Normal range of motion.   Skin:     General: Skin is warm and dry.   Neurological:      General: No focal deficit present.      Mental Status: She is alert and oriented to person, place, and time.   Psychiatric:         Behavior: Behavior normal.          Procedures         Assessment and Plan  ENCOUNTER DIAGNOSES     ICD-10-CM   1. Hypertension, unspecified type  I10   2. 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 Elevated TSH  R79.89   4. Iron deficiency anemia  D50.9   5. Gastroesophageal reflux disease without esophagitis  K21.9   6. Non-alcoholic fatty liver disease  Z61.0   7. Other  insomnia  G47.09   8. History of anemia  Z86.2    9. Tachycardia  R00.0   10. Anxiety  F41.9   11. 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 Need for hepatitis C screening test  Z11.59   13. Screening for HIV (human immunodeficiency virus)  Z11.4     Orders Placed This Encounter    THYROID STIMULATING HORMONE (SENSITIVE TSH)    T3 (TRIIODOTHYRONINE), FREE, SERUM    THYROXINE, FREE (FREE T4)    THYROPEROXIDASE (TPO) ANTIBODIES, SERUM    HEPATITIS C ANTIBODY SCREEN WITH REFLEX TO HCV PCR    HIV1/HIV2 SCREEN, COMBINED ANTIGEN AND ANTIBODY    CBC/DIFF    FERRITIN    IRON TRANSFERRIN AND TIBC    THYROID STIMULATING HORMONE (SENSITIVE TSH)    T3 (TRIIODOTHYRONINE), FREE, SERUM    THYROXINE, FREE (FREE T4)    Refer to [facility] [place]    Refer to External Provider    metoprolol succinate (TOPROL-XL) 50 mg Oral Tablet Sustained Release 24 hr    metoprolol succinate (TOPROL-XL) 25 mg Oral Tablet Sustained Release 24 hr    traZODone (DESYREL) 50 mg Oral Tablet    pantoprazole (PROTONIX) 20 mg Oral Tablet, Delayed Release (E.C.)    levothyroxine (SYNTHROID) 25 mcg Oral Tablet         (I10) Hypertension, unspecified type  (primary encounter diagnosis)  BP is stable on current medication    (E03.9) Hypothyroidism (acquired)  levothyroxine (SYNTHROID) 25 mcg Oral Tablet,   THYROID STIMULATING HORMONE (SENSITIVE TSH), T3        (TRIIODOTHYRONINE), FREE, SERUM, THYROXINE,         FREE (FREE T4)            (R79.89) Elevated TSH  THYROID STIMULATING HORMONE (SENSITIVE TSH), T3        (TRIIODOTHYRONINE), FREE, SERUM, THYROXINE,         FREE (FREE T4), THYROPEROXIDASE (TPO)         ANTIBODIES, SERUM            (D50.9) Iron deficiency anemia   CBC/DIFF, FERRITIN, IRON TRANSFERRIN AND TIBC            (K21.9) Gastroesophageal reflux disease without esophagitis  pantoprazole (PROTONIX) 20 mg Oral Tablet,         Delayed Release (E.C.)            (K76.0) Non-alcoholic fatty liver disease  Encouraged lifestyle modifications and weight  loss    (G47.09) Other insomnia  traZODone (DESYREL) 50 mg Oral Tablet            (Z86.2) History of anemia  Continue OTC iron supplementation    (R00.0) Tachycardia  metoprolol succinate (TOPROL-XL) 50 mg Oral         Tablet Sustained Release 24 hr,   metoprolol         succinate (TOPROL-XL) 25 mg Oral Tablet         Sustained Release 24 hr            (F41.9) Anxiety   Refer to [facility] [place]            (E66.01,  Z68.42) 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 The patient was encouraged to call with any additional questions or concerns.     There are no Patient Instructions on file for this visit.    Return in about 4 weeks (around 10/10/2022) for Follow-up medication changes.    Cleda Daub, FNP-BC      This note may have been partially generated using MModal Fluency Direct system, and there may be some incorrect words, spellings, and punctuation that were not noted in checking the note before saving.

## 2022-10-08 ENCOUNTER — Ambulatory Visit (INDEPENDENT_AMBULATORY_CARE_PROVIDER_SITE_OTHER): Payer: Self-pay | Admitting: Cardiovascular Disease

## 2022-10-20 ENCOUNTER — Ambulatory Visit (HOSPITAL_BASED_OUTPATIENT_CLINIC_OR_DEPARTMENT_OTHER): Payer: Self-pay | Admitting: Family Medicine

## 2022-10-22 ENCOUNTER — Other Ambulatory Visit: Payer: MEDICAID | Attending: Family Medicine

## 2022-10-22 ENCOUNTER — Other Ambulatory Visit: Payer: Self-pay

## 2022-10-22 ENCOUNTER — Other Ambulatory Visit (HOSPITAL_BASED_OUTPATIENT_CLINIC_OR_DEPARTMENT_OTHER): Payer: Self-pay | Admitting: Family Medicine

## 2022-10-22 DIAGNOSIS — G4709 Other insomnia: Secondary | ICD-10-CM

## 2022-10-22 DIAGNOSIS — K219 Gastro-esophageal reflux disease without esophagitis: Secondary | ICD-10-CM

## 2022-10-22 DIAGNOSIS — E039 Hypothyroidism, unspecified: Secondary | ICD-10-CM | POA: Insufficient documentation

## 2022-10-22 LAB — THYROXINE, FREE (FREE T4): THYROXINE (T4), FREE: 0.94 ng/dL (ref 0.70–1.48)

## 2022-10-22 LAB — T3 (TRIIODOTHYRONINE), FREE, SERUM: T3 FREE: 3.4 pg/mL (ref 1.7–3.7)

## 2022-10-22 LAB — THYROID STIMULATING HORMONE (SENSITIVE TSH): TSH: 4.894 u[IU]/mL (ref 0.350–4.940)

## 2022-10-24 ENCOUNTER — Other Ambulatory Visit: Payer: Self-pay

## 2022-10-24 ENCOUNTER — Other Ambulatory Visit (HOSPITAL_BASED_OUTPATIENT_CLINIC_OR_DEPARTMENT_OTHER): Payer: Self-pay | Admitting: Family Medicine

## 2022-10-24 ENCOUNTER — Ambulatory Visit: Payer: MEDICAID | Attending: Family Medicine | Admitting: Family Medicine

## 2022-10-24 ENCOUNTER — Encounter (HOSPITAL_BASED_OUTPATIENT_CLINIC_OR_DEPARTMENT_OTHER): Payer: Self-pay | Admitting: Family Medicine

## 2022-10-24 VITALS — BP 142/80 | HR 91 | Temp 98.5°F | Ht 62.5 in | Wt 275.0 lb

## 2022-10-24 DIAGNOSIS — E039 Hypothyroidism, unspecified: Secondary | ICD-10-CM | POA: Insufficient documentation

## 2022-10-24 DIAGNOSIS — K219 Gastro-esophageal reflux disease without esophagitis: Secondary | ICD-10-CM

## 2022-10-24 DIAGNOSIS — I10 Essential (primary) hypertension: Secondary | ICD-10-CM | POA: Insufficient documentation

## 2022-10-24 DIAGNOSIS — R000 Tachycardia, unspecified: Secondary | ICD-10-CM | POA: Insufficient documentation

## 2022-10-24 DIAGNOSIS — F17210 Nicotine dependence, cigarettes, uncomplicated: Secondary | ICD-10-CM | POA: Insufficient documentation

## 2022-10-24 MED ORDER — LEVOTHYROXINE 50 MCG TABLET
50.0000 ug | ORAL_TABLET | Freq: Every morning | ORAL | 1 refills | Status: DC
Start: 2022-10-24 — End: 2023-01-29

## 2022-10-24 NOTE — Progress Notes (Signed)
[facility]  [address] [place] [address]  Operated by [facility]    Progress Note     Name: Kristina Baxter MRN:  [id number]   Date: 10/24/2022 Age: 32 y.o.       Chief Complaint: Medication follow up (Pt here today for medication follow up for levothyroxine and metoprolol. Pt states they are working well. No other concerns at this time. Kalen Ellifritz, MA/)    History of Present Illness:  Patient is seen today for medication follow-ups.     Patients TSH was elevated on her last two assessments. She was started on levothyroxine 25 mcg. Her TSH decreased to 4.894. She states that she is feeling better now that she started the medication. She would like to increase the medication to further lower her TSH. Will increase to 50 mcg and repeat TSH in 6 weeks.     Patient is doing well on 75 mcg of metoprolol for tachycardia. Her HR in the office today is 91. She states that she is not having episodes of tachycardia. She was evaluated by cardiology and was instructed to continue her metoprolol. She will follow-up with cardiology 02/27/2023.     Patient has her PAP performed by Dr. Beverlyn Roux in [facility]. Will contact Health department for documentation.     Patient will follow-up in the office for routine visit in November.         Past Medical History  Past Medical History:   Diagnosis Date    Anxiety     Depression          Past Surgical History:   Procedure Laterality Date    DENTAL SURGERY      FOOT SURGERY Right          Current Outpatient Medications   Medication Sig    albuterol sulfate (PROVENTIL HFA) 90 mcg/actuation Inhalation oral inhaler Take 1-2 Puffs by inhalation Every 6 hours as needed    Cetirizine 10 mg Oral Capsule Take 1 Capsule (10 mg total) by mouth (Patient not taking: Reported on 09/12/2022)    etonogestreL (NEXPLANON) 68 mg Subdermal Implant 68 mg by Subdermal route One time For subdermal insertion by health care providers trained in the  insertion and removal procedure.    fluticasone propionate (FLONASE) 50 mcg/actuation Nasal Spray, Suspension Administer 1-2 Sprays into affected nostril(s)    hydrOXYzine pamoate (VISTARIL) 25 mg Oral Capsule TAKE 1 CAPSULE BY MOUTH EVERY 6 TO 8 HOURS AS NEEDED FOR ANXIETY AND FOR ITCHING    levothyroxine (SYNTHROID) 50 mcg Oral Tablet Take 1 Tablet (50 mcg total) by mouth Every morning Indications: a condition with low thyroid hormone levels    metoprolol succinate (TOPROL-XL) 25 mg Oral Tablet Sustained Release 24 hr Take 1 Tablet (25 mg total) by mouth Once a day Take with 50 mg to equal 75 mg daily    metoprolol succinate (TOPROL-XL) 50 mg Oral Tablet Sustained Release 24 hr Take 1 Tablet (50 mg total) by mouth Once a day for 90 days Take with 25 mg to equal 75 mg daily Indications: Tachycardia    pantoprazole (PROTONIX) 20 mg Oral Tablet, Delayed Release (E.C.) Take 1 Tablet (20 mg total) by mouth Twice a day before meals for 90 days Indications: gastroesophageal reflux disease    traZODone (DESYREL) 50 mg Oral Tablet Take 1 Tablet (50 mg total) by mouth Every night Indications: insomnia associated with depression     Allergies  Allergen Reactions    Fluconazole Rash    Doxycycline Hives/ Urticaria    Fish Derived Nausea/ Vomiting    Klonopin [Clonazepam] Hives/ Urticaria    Minocycline Hives/ Urticaria     Social History     Tobacco Use   Smoking Status Every Day    Current packs/day: 0.25    Average packs/day: 0.3 packs/day for 16.5 years (4.1 ttl pk-yrs)    Types: Cigarettes    Start date: 2008   Smokeless Tobacco Never       Family History  Family Medical History:       Problem Relation (Age of Onset)    Alzheimer's/Dementia Maternal Grandmother    Cancer Maternal Grandfather    Diabetes Maternal Grandfather    Heart Attack Mother, Maternal Grandmother, Maternal Grandfather    Hypertension (High Blood Pressure) Mother, Maternal Grandmother, Maternal Grandfather    Lung disease Maternal Grandmother    No  Known Problems Sister    Stroke Maternal Grandfather    Thyroid Cancer Maternal Aunt              Review of Systems  All pertinent Review of System as address in the HPI.    Review of Systems Reviewed    Imaging: No results were found from the past 30 days.    LABS REVIEWED  CBC  Diff   Lab Results   Component Value Date/Time    WBC 6.2 09/12/2022 11:11 AM    HGB 15.3 09/12/2022 11:11 AM    HCT 44.0 09/12/2022 11:11 AM    PLTCNT 260 09/12/2022 11:11 AM    RBC 4.83 09/12/2022 11:11 AM    MCV 91.1 09/12/2022 11:11 AM    MCHC 34.8 09/12/2022 11:11 AM    MCH 31.7 09/12/2022 11:11 AM    MPV 8.7 09/12/2022 11:11 AM    Lab Results   Component Value Date/Time    PMNS 63.0 09/12/2022 11:11 AM    MONOCYTES 7.0 09/12/2022 11:11 AM    BASOPHILS 1.0 09/12/2022 11:11 AM    BASOPHILS 0.04 09/12/2022 11:11 AM    PMNABS 3.94 09/12/2022 11:11 AM    LYMPHSABS 1.67 09/12/2022 11:11 AM    EOSABS 0.15 09/12/2022 11:11 AM    MONOSABS 0.41 09/12/2022 11:11 AM          COMPREHENSIVE METABOLIC PANEL   Lab Results   Component Value Date    SODIUM 139 09/10/2022    POTASSIUM 3.8 09/10/2022    CHLORIDE 108 09/10/2022    CO2 25 09/10/2022    ANIONGAP 6 09/10/2022    BUN 11 09/10/2022    CREATININE 0.86 09/10/2022    CALCIUM 9.3 09/10/2022    ALBUMIN 3.8 09/10/2022    TOTALPROTEIN 7.4 09/10/2022    ALKPHOS 81 09/10/2022    AST 24 09/10/2022    ALT 30 (H) 09/10/2022           Lab Results   Component Value Date    CHOLESTEROL 121 09/10/2022    HDLCHOL 35 (L) 09/10/2022    LDLCHOL 71 09/10/2022    TRIG 71 09/10/2022        Appointment on 10/22/2022   Component Date Value Ref Range Status    TSH 10/22/2022 4.894  0.350 - 4.940 uIU/mL Final    Pregnant patients have trimester-specific ranges derived from this analyzer/technology. For singleton pregnancies:    First trimester, 0.15 - 2.45 uIU/mL  Second trimester, 0.35 - 3.20 uIU/mL  Third trimester, 0.40 - 4.30 uIU/mL  T3 FREE 10/22/2022 3.4  1.7 - 3.7 pg/mL Final    THYROXINE (T4), FREE 10/22/2022  0.94  0.70 - 1.48 ng/dL Final    Pregnant patients have trimester-specific reference intervals derived from a meta-analysis (Osinga et al, 2022):    First trimester: 0.85-1.45 ng/dL  Second trimester: 5.40-9.81 ng/dL  Third trimester: 1.91-4.78 ng/dL         The ASCVD Risk score (Arnett DK, et al., 2019) failed to calculate for the following reasons:    The 2019 ASCVD risk score is only valid for ages 44 to 76    Vitals:    10/24/22 1046   BP: (!) 142/80   Pulse: 91   Temp: 36.9 C (98.5 F)   TempSrc: Tympanic   SpO2: 98%   Weight: 125 kg (275 lb)   Height: 1.588 m (5' 2.5")   BMI: 49.6       Physical Examination:  Physical Exam  Vitals and nursing note reviewed.   Constitutional:       Appearance: Normal appearance.   HENT:      Head: Normocephalic and atraumatic.   Cardiovascular:      Rate and Rhythm: Normal rate.   Pulmonary:      Effort: Pulmonary effort is normal.   Musculoskeletal:         General: Normal range of motion.      Cervical back: Normal range of motion.   Skin:     General: Skin is warm and dry.   Neurological:      General: No focal deficit present.      Mental Status: She is alert and oriented to person, place, and time.   Psychiatric:         Mood and Affect: Mood normal.         Behavior: Behavior normal.          Procedures         Assessment and Plan  ENCOUNTER DIAGNOSES     ICD-10-CM   1. 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 Tachycardia  R00.0     Orders Placed This Encounter    THYROID STIMULATING HORMONE (SENSITIVE TSH)    T3 (TRIIODOTHYRONINE), FREE, SERUM    THYROXINE, FREE (FREE T4)    levothyroxine (SYNTHROID) 50 mcg Oral Tablet         (E03.9) Hypothyroidism (acquired)  (primary encounter diagnosis)  levothyroxine (SYNTHROID) 50 mcg Oral Tablet,   THYROID STIMULATING HORMONE (SENSITIVE TSH), T3        (TRIIODOTHYRONINE), FREE, SERUM, THYROXINE,         FREE (FREE T4)            (R00.0) Tachycardia  Continue metoprolol 75 mg daily      The patient was given the opportunity to ask  questions and those questions were answered to the patient's satisfaction. The patient was encouraged to call with any additional questions or concerns.     There are no Patient Instructions on file for this visit.    Return in about 4 months (around 02/24/2023) for Routine follow-up with labs.    Cleda Daub, FNP-BC      This note may have been partially generated using MModal Fluency Direct system, and there may be some incorrect words, spellings, and punctuation that were not noted in checking the note before saving.

## 2022-12-10 ENCOUNTER — Ambulatory Visit (HOSPITAL_BASED_OUTPATIENT_CLINIC_OR_DEPARTMENT_OTHER): Payer: Self-pay

## 2022-12-24 ENCOUNTER — Ambulatory Visit (HOSPITAL_BASED_OUTPATIENT_CLINIC_OR_DEPARTMENT_OTHER): Payer: Self-pay

## 2022-12-31 ENCOUNTER — Other Ambulatory Visit (HOSPITAL_BASED_OUTPATIENT_CLINIC_OR_DEPARTMENT_OTHER): Payer: Self-pay | Admitting: Family Medicine

## 2022-12-31 DIAGNOSIS — R000 Tachycardia, unspecified: Secondary | ICD-10-CM

## 2023-01-05 ENCOUNTER — Telehealth: Payer: Self-pay

## 2023-01-05 ENCOUNTER — Telehealth: Payer: Self-pay | Admitting: Physician Assistant

## 2023-01-05 ENCOUNTER — Telehealth (HOSPITAL_BASED_OUTPATIENT_CLINIC_OR_DEPARTMENT_OTHER): Payer: Self-pay | Admitting: Family Medicine

## 2023-01-05 DIAGNOSIS — K047 Periapical abscess without sinus: Secondary | ICD-10-CM

## 2023-01-05 MED ORDER — NAPROXEN 500 MG PO TABS: 500.0000 mg | ORAL_TABLET | Freq: Two times a day (BID) | ORAL | 0 refills | Status: DC

## 2023-01-05 MED ORDER — AMOXICILLIN-POT CLAVULANATE 875-125 MG PO TABS: 1.0000 | ORAL_TABLET | Freq: Two times a day (BID) | ORAL | 0 refills | Status: DC

## 2023-01-05 NOTE — Progress Notes (Signed)
E-Visit for Dental Pain  We are sorry that you are not feeling well.  Here is how we plan to help!  Based on what you have shared with me in the questionnaire, it sounds like you have a dental infection.   Augmentin 875-125mg  twice a day for 7 days and Naprosyn 500mg  2 times a day for 7 days for discomfort  It is imperative that you see a dentist within 10 days of this eVisit to determine the cause of the dental pain and be sure it is adequately treated  A toothache or tooth pain is caused when the nerve in the root of a tooth or surrounding a tooth is irritated. Dental (tooth) infection, decay, injury, or loss of a tooth are the most common causes of dental pain. Pain may also occur after an extraction (tooth is pulled out). Pain sometimes originates from other areas and radiates to the jaw, thus appearing to be tooth pain.Bacteria growing inside your mouth can contribute to gum disease and dental decay, both of which can cause pain. A toothache occurs from inflammation of the central portion of the tooth called pulp. The pulp contains nerve endings that are very sensitive to pain. Inflammation to the pulp or pulpitis may be caused by dental cavities, trauma, and infection.    HOME CARE:   For toothaches: Over-the-counter pain medications such as acetaminophen or ibuprofen may be used. Take these as directed on the package while you arrange for a dental appointment. Avoid very cold or hot foods, because they may make the pain worse. You may get relief from biting on a cotton ball soaked in oil of cloves. You can get oil of cloves at most drug stores.  For jaw pain:  Aspirin may be helpful for problems in the joint of the jaw in adults. If pain happens every time you open your mouth widely, the temporomandibular joint (TMJ) may be the source of the pain. Yawning or taking a large bite of food may worsen the pain. An appointment with your doctor or dentist will help you find the cause.      GET HELP RIGHT AWAY IF:  You have a high fever or chills If you have had a recent head or face injury and develop headache, light headedness, nausea, vomiting, or other symptoms that concern you after an injury to your face or mouth, you could have a more serious injury in addition to your dental injury. A facial rash associated with a toothache: This condition may improve with medication. Contact your doctor for them to decide what is appropriate. Any jaw pain occurring with chest pain: Although jaw pain is most commonly caused by dental disease, it is sometimes referred pain from other areas. People with heart disease, especially people who have had stents placed, people with diabetes, or those who have had heart surgery may have jaw pain as a symptom of heart attack or angina. If your jaw or tooth pain is associated with lightheadedness, sweating, or shortness of breath, you should see a doctor as soon as possible. Trouble swallowing or excessive pain or bleeding from gums: If you have a history of a weakened immune system, diabetes, or steroid use, you may be more susceptible to infections. Infections can often be more severe and extensive or caused by unusual organisms. Dental and gum infections in people with these conditions may require more aggressive treatment. An abscess may need draining or IV antibiotics, for example.  MAKE SURE YOU   Understand these  instructions. Will watch your condition. Will get help right away if you are not doing well or get worse.  Thank you for choosing an e-visit.  Your e-visit answers were reviewed by a board certified advanced clinical practitioner to complete your personal care plan. Depending upon the condition, your plan could have included both over the counter or prescription medications.  Please review your pharmacy choice. Make sure the pharmacy is open so you can pick up prescription now. If there is a problem, you may contact your provider  through [employer] and have the prescription routed to another pharmacy.  Your safety is important to [place]. If you have drug allergies check your prescription carefully.   For the next 24 hours you can use MyChart to ask questions about today's visit, request a non-urgent call back, or ask for a work or school excuse. You will get an email in the next two days asking about your experience. I hope that your e-visit has been valuable and will speed your recovery.  I have spent 5 minutes in review of e-visit questionnaire, review and updating patient chart, medical decision making and response to patient.   Margaretann Loveless, PA-C

## 2023-01-05 NOTE — Telephone Encounter (Signed)
Called pt to verify pharmacy. She stated that she is [facility] [place]. She asked if she could go to [facility] to have labs completed. Let her know that it fine.

## 2023-01-08 ENCOUNTER — Other Ambulatory Visit (HOSPITAL_BASED_OUTPATIENT_CLINIC_OR_DEPARTMENT_OTHER): Payer: Self-pay | Admitting: Family Medicine

## 2023-01-08 DIAGNOSIS — R000 Tachycardia, unspecified: Secondary | ICD-10-CM

## 2023-01-15 ENCOUNTER — Ambulatory Visit (HOSPITAL_BASED_OUTPATIENT_CLINIC_OR_DEPARTMENT_OTHER): Payer: MEDICAID

## 2023-01-23 ENCOUNTER — Ambulatory Visit: Payer: MEDICAID | Admitting: Physician Assistant

## 2023-01-23 DIAGNOSIS — R519 Headache, unspecified: Secondary | ICD-10-CM

## 2023-01-23 DIAGNOSIS — K0889 Other specified disorders of teeth and supporting structures: Secondary | ICD-10-CM

## 2023-01-23 DIAGNOSIS — R067 Sneezing: Secondary | ICD-10-CM

## 2023-01-23 DIAGNOSIS — R0981 Nasal congestion: Secondary | ICD-10-CM

## 2023-01-23 MED ORDER — LACTOBACILLUS ACIDOPHILUS 75 MILLION CELL-PECTIN 100 MG CAPSULE
1.0000 | ORAL_CAPSULE | Freq: Every day | ORAL | 0 refills | Status: AC
Start: 2023-01-23 — End: 2023-02-06

## 2023-01-23 MED ORDER — PENICILLIN V POTASSIUM 500 MG TABLET
500.0000 mg | ORAL_TABLET | Freq: Three times a day (TID) | ORAL | 0 refills | Status: DC
Start: 2023-01-23 — End: 2023-01-24

## 2023-01-23 NOTE — E-Visit Routing Comment (Signed)
E-visit received and forwarded to [employer], per patient request.  Blanche East, PATIENT NAVIGATOR,01/23/2023,12:58

## 2023-01-23 NOTE — Progress Notes (Signed)
[facility]  [address]  [place] [address]  E-Visit Note  Attending Dr. Zada Girt  Reason for E-Visit: Visit Diagnosis with associated orders:     ICD-10-CM    1. Sinus congestion  R09.81       2. Pain, dental  K08.89         [facility] E-Visit Sinus 1       Question 01/23/2023 12:52 PM EDT - Filed by Patient    Which of the following have you been experiencing? (congested nose, pain around nose and face, headache, ear pain, neck pain, cough, sneezing, watery eyes) Pain around the nose and face     Sneezing    In the last month, have you been in contact with someone who was confirmed or suspected to have Coronavirus / COVID-19? No / Unsure    Have you had any of the following? (difficulty swallowing, difficulty breathing, dificulty seeing or seeing blurry or double) None of the above    If we need to reach you today, what is the best method of contact?  Please indicate [facility] or list the best phone number for contact. [email or web address]    Do you have chronic exposure to second hand smoke?  No    How long have you been having these symptoms? Just today    Do you have a fever? No, I do not have a fever    Do you smoke? No    Have you ever smoked? I smoked in the past, but quit    Do you have any chronic illnesses, such as diabetes, heart disease, or lung disease, or any illness that would weaken your body's ability to fight infection? No    Have you experienced similar problems in the past? Yes    What treatments have worked in the past?  What has not worked? Antibiotics in the past but the infection from my sniuses has went down to tooth which has started a absess in my tooth which cause swelling and pain    Is this illness similar to previous illnesses you have had?  How is it the same?  How is it different? No just making my face and tooth hurt really bad    Have you recently been hospitalized? No    What medications are you currently taking for these symptoms? Decongestants      Pain medicine    Please enter the names of any medications you are taking, or any other treatments you are trying. Metoprolol er 25mg  and 50mg  once a day     Pantoprazole 20 twice a day     Iron 27mg  once a day     Levothyroxin 50mg  once a day      Baby aspirin a day    Are you pregnant? I am confident that I am not pregnant    Are you breastfeeding? No    Anything else you would like to add?               No images are attached to the encounter or orders placed in the encounter.   Assessment:   Assessment/Plan   1. Sinus congestion    2. Pain, dental        Hello there,  I have sent antibiotics to your pharmacy in the event this is a dental infection. Once you start the prescription you MUST finish the entire course even if you are feeling better after a few days in order  to prevent antibiotic resistance. If you have fever, difficulty swallowing, difficulty breathing, inability to open your jaw, swelling under your chin or swelling under your tongue go to the ER. If you have significant visible facial swelling the antibiotics may not help without an incision and drainage of a potential abscess so I'd recommend going to the ER.You will need to follow up with a dentist as this is only a temporary fix. [facility] has a [facility] of dent[facility] that takes walk-in visits starting at 8 am on the week days.  It is located at [facility] just beyond the ER entrance.      Orders Placed This Encounter    penicillin V potassium (VEETID) 500 mg Oral Tablet    lactobacillus acidoph-pectin (ACIDOPHILUS-PECTIN) 75 million cell -100 mg Oral Capsule        Preferred Pharmacy       [facility] [address] [place], [place] - [address]    [address] [place] [address]    Phone: [phone] Fax: [phone]    Hours: Not open 24 hours          The above listed pharmacy is the pharmacy you have selected and is where any prescriptions for this visit have been sent. If for some reason you need to change your pharmacy, simply call the new  preferred pharmacy and ask them to retrieve the prescription from the above listed pharmacy.     The patient (or their delegate) initiated request for e-visit using MyChart patient portal.  See MyChart messages documented in this encounter for details of online digital evaluation and management provided.    I personally spent 6 minutes of cumulative time performing this service (within 7 days since e-visit initiated).    Evlyn Courier [place], PA-C  01/23/2023, 12:59

## 2023-01-24 ENCOUNTER — Other Ambulatory Visit: Payer: Self-pay

## 2023-01-24 ENCOUNTER — Encounter (HOSPITAL_COMMUNITY): Payer: Self-pay

## 2023-01-24 ENCOUNTER — Emergency Department
Admission: EM | Admit: 2023-01-24 | Discharge: 2023-01-24 | Disposition: A | Discharge status: Home / Self Care | Payer: MEDICAID | Source: Home / Self Care | Attending: Family Medicine | Admitting: Family Medicine

## 2023-01-24 DIAGNOSIS — L03211 Cellulitis of face: Secondary | ICD-10-CM | POA: Insufficient documentation

## 2023-01-24 DIAGNOSIS — K029 Dental caries, unspecified: Secondary | ICD-10-CM | POA: Insufficient documentation

## 2023-01-24 LAB — BLUE TOP TUBE

## 2023-01-24 LAB — GRAY TOP TUBE

## 2023-01-24 LAB — LAVENDER TOP TUBE

## 2023-01-24 LAB — LIGHT GREEN TOP TUBE

## 2023-01-24 MED ORDER — SODIUM CHLORIDE 0.9 % INTRAVENOUS PIGGYBACK
2.0000 g | INTRAVENOUS | Status: AC
Start: 2023-01-24 — End: 2023-01-24
  Administered 2023-01-24: 2 g via INTRAVENOUS
  Administered 2023-01-24: 0 g via INTRAVENOUS
  Filled 2023-01-24: qty 20

## 2023-01-24 MED ORDER — AMOXICILLIN 875 MG-POTASSIUM CLAVULANATE 125 MG TABLET
1.0000 | ORAL_TABLET | Freq: Two times a day (BID) | ORAL | 0 refills | Status: AC
Start: 2023-01-24 — End: 2023-02-03

## 2023-01-24 NOTE — ED Triage Notes (Signed)
Patient with dental pain to right side, had telehealth visit and rx penicillin today. Patient states mild pain but concerned swelling is not improving and she thinks is getting worse. NAD noted in triage.

## 2023-01-24 NOTE — ED Nurses Note (Signed)
Patient discharged home with family.  AVS reviewed with patient/care giver.  A written copy of the AVS and discharge instructions was given to the patient/care giver.  Questions sufficiently answered as needed.  Patient/care giver encouraged to follow up with PCP as indicated.  In the event of an emergency, patient/care giver instructed to call 911 or go to the nearest emergency room.

## 2023-01-24 NOTE — Discharge Instructions (Signed)
Augmentin as prescribed.  Motrin as needed.  Stop penicillin.  Return if painful ocular motion.  Follow up with dentist as soon as possible.

## 2023-01-24 NOTE — ED Provider Notes (Signed)
[facility]  EMERGENCY DEPARTMENT NOTE    Date:   01/24/2023  Name: Kristina Baxter  Age: 32 y.o.  MRN: [id number]    Chief Complaint: Facial Swelling    History of Present Illness  32 year old female significant past medical history of obesity and dental caries presents to [facility] ED due to facial swelling.  Patient states began to suffer right maxillary swelling and dental pain this morning.  Patient prescribed penicillin therapy at that time.  Patient states facial swelling continues to worsen.  Patient denies cough or fever.  Patient denies painful ocular motion.    Past Medical History  Past Medical History:   Diagnosis Date    Anxiety     Depression          Past Surgical History:   Procedure Laterality Date    DENTAL SURGERY      FOOT SURGERY Right      Current Outpatient Medications   Medication Sig    albuterol sulfate (PROVENTIL HFA) 90 mcg/actuation Inhalation oral inhaler Take 1-2 Puffs by inhalation Every 6 hours as needed    amoxicillin-pot clavulanate (AUGMENTIN) 875-125 mg Oral Tablet Take 1 Tablet by mouth Twice daily for 10 days    Cetirizine 10 mg Oral Capsule Take 1 Capsule (10 mg total) by mouth (Patient not taking: Reported on 09/12/2022)    etonogestreL (NEXPLANON) 68 mg Subdermal Implant 68 mg by Subdermal route One time For subdermal insertion by health care providers trained in the insertion and removal procedure.    fluticasone propionate (FLONASE) 50 mcg/actuation Nasal Spray, Suspension Administer 1-2 Sprays into affected nostril(s)    hydrOXYzine pamoate (VISTARIL) 25 mg Oral Capsule TAKE 1 CAPSULE BY MOUTH EVERY 6 TO 8 HOURS AS NEEDED FOR ANXIETY AND FOR ITCHING    lactobacillus acidoph-pectin (ACIDOPHILUS-PECTIN) 75 million cell -100 mg Oral Capsule Take 1 Capsule by mouth Once a day for 14 days    levothyroxine (SYNTHROID) 50 mcg Oral Tablet Take 1 Tablet (50 mcg total) by mouth Every morning Indications: a condition with low thyroid hormone levels    metoprolol succinate (TOPROL-XL)  25 mg Oral Tablet Sustained Release 24 hr Take 1 Tablet (25 mg total) by mouth Once a day Take with 50 mg to equal 75 mg daily    metoprolol succinate (TOPROL-XL) 50 mg Oral Tablet Sustained Release 24 hr Take 1 tablet by mouth once daily    pantoprazole (PROTONIX) 20 mg Oral Tablet, Delayed Release (E.C.) TAKE 1 TABLET BY MOUTH TWICE DAILY BEFORE MEAL(S)    traZODone (DESYREL) 50 mg Oral Tablet Take 1 Tablet (50 mg total) by mouth Every night Indications: insomnia associated with depression     Allergies   Allergen Reactions    Fluconazole Rash    Doxycycline Hives/ Urticaria    Fish Derived Nausea/ Vomiting    Klonopin [Clonazepam] Hives/ Urticaria    Minocycline Hives/ Urticaria     Family History  Family Medical History:       Problem Relation (Age of Onset)    Alzheimer's/Dementia Maternal Grandmother    Cancer Maternal Grandfather    Diabetes Maternal Grandfather    Heart Attack Mother, Maternal Grandmother, Maternal Grandfather    Hypertension (High Blood Pressure) Mother, Maternal Grandmother, Maternal Grandfather    Lung disease Maternal Grandmother    No Known Problems Sister    Stroke Maternal Grandfather    Thyroid Cancer Maternal Aunt          Review of Systems  Twelve point review  of systems unremarkable with the exception of positives listed in History of Present Illness.     Physical Examination:    BP 129/84   Pulse 87   Temp 36.2 C (97.2 F)   Resp 18   Ht 1.6 m (5\' 3" )   Wt 131 kg (289 lb)   SpO2 99%   BMI 51.19 kg/m     General Appearance: Alert and oriented; No acute distress; Cooperative; Appears stated age  Head: Normocephalic, extensive right maxillary swelling  Eyes: PERRL; EOMI; Conjunctivae clear  Ears: External auditory canals clear; Tympanic membranes intact with normal light reflex   Nose: Nares patent; No drainage appreciated  Throat: No pharyngeal erythema or tonsillar exudate; extensive right maxillary dental caries with gingival erythema without apical abscess  Neck:  Supple; Full ROM; No lymphadenopathy    Lungs: Clear to auscultation in all lung fields; No rhonchi; No wheezes    Heart: Regular rate and rhythm; No murmurs appreciated   Extremities: Radial pulses (+); No edema  Musculoskeletal: No deformities; No swelling  Neurological: Alert and oriented    No results found for any visits on 01/24/23.    Assessment and Plan    ENCOUNTER DIAGNOSES     ICD-10-CM   1. Facial cellulitis  L03.211   2. Dental caries  K02.9     Medical decision making: History and physical exam suggestive of facial cellulitis secondary to dental caries.  Patient with no evidence of orbital extension.  Rocephin given.  Patient stable for discharge home with Augmentin therapy with instructions to follow up dentist for further evaluation and care and possibly return if for ocular motion.    Orders Placed This Encounter    EXTRA TUBES - [facility]    LIGHT GREEN TOP TUBE    BLUE TOP TUBE    LAVENDER TOP TUBE    GRAY TOP TUBE    cefTRIAXone (ROCEPHIN) 2 g in NS 50 mL IVPB minibag    amoxicillin-pot clavulanate (AUGMENTIN) 875-125 mg Oral Tablet     Augmentin as prescribed.  Motrin as needed.  Follow up dentist for definitive care.  Return if painful ocular motion.  Follow up with PCP in 1 week.    Ruel Favors, DO

## 2023-01-28 ENCOUNTER — Telehealth (HOSPITAL_BASED_OUTPATIENT_CLINIC_OR_DEPARTMENT_OTHER): Payer: Self-pay | Admitting: Family Medicine

## 2023-01-28 ENCOUNTER — Other Ambulatory Visit (HOSPITAL_BASED_OUTPATIENT_CLINIC_OR_DEPARTMENT_OTHER): Payer: Self-pay | Admitting: Family Medicine

## 2023-01-28 DIAGNOSIS — E039 Hypothyroidism, unspecified: Secondary | ICD-10-CM

## 2023-01-28 NOTE — Telephone Encounter (Signed)
Patient called in about her thyroid level, she thinks her meds may need to be increased, she said she is feeling like she did before starting the medication she would like a call back thanks

## 2023-01-28 NOTE — Telephone Encounter (Signed)
Returned pt's call regarding thyroid issues. Let her know that the provider would like to repeat thyroid labs before changing medications. Pt was transferred to the front to schedule lab appointment.

## 2023-01-29 ENCOUNTER — Other Ambulatory Visit: Payer: Self-pay

## 2023-01-29 ENCOUNTER — Other Ambulatory Visit: Payer: MEDICAID | Attending: Family Medicine

## 2023-01-29 ENCOUNTER — Other Ambulatory Visit (HOSPITAL_BASED_OUTPATIENT_CLINIC_OR_DEPARTMENT_OTHER): Payer: Self-pay | Admitting: Family Medicine

## 2023-01-29 ENCOUNTER — Telehealth (HOSPITAL_BASED_OUTPATIENT_CLINIC_OR_DEPARTMENT_OTHER): Payer: Self-pay | Admitting: Family Medicine

## 2023-01-29 DIAGNOSIS — E039 Hypothyroidism, unspecified: Secondary | ICD-10-CM | POA: Insufficient documentation

## 2023-01-29 LAB — THYROXINE, FREE (FREE T4): THYROXINE (T4), FREE: 1.03 ng/dL (ref 0.70–1.48)

## 2023-01-29 LAB — T3 (TRIIODOTHYRONINE), FREE, SERUM: T3 FREE: 2.9 pg/mL (ref 1.7–3.7)

## 2023-01-29 LAB — THYROID STIMULATING HORMONE (SENSITIVE TSH): TSH: 4.576 u[IU]/mL (ref 0.350–4.940)

## 2023-01-29 MED ORDER — LEVOTHYROXINE 75 MCG TABLET
75.0000 ug | ORAL_TABLET | Freq: Every morning | ORAL | 1 refills | Status: DC
Start: 2023-01-29 — End: 2023-07-13

## 2023-01-29 NOTE — Telephone Encounter (Signed)
Called pt regarding her labs. Let her know that her thyroid labs were in the normal range but they were on the high side. We can increase the dose of her levothyroxine if she would like. she asked if she would take two what she has now. Let her know that she would not, that we would send in a new prescription for 75 mcg. She said she would like it sent to [facility] pharmacy in [place].

## 2023-02-09 ENCOUNTER — Ambulatory Visit: Payer: MEDICAID

## 2023-02-09 ENCOUNTER — Other Ambulatory Visit: Payer: Self-pay

## 2023-02-09 ENCOUNTER — Encounter (HOSPITAL_BASED_OUTPATIENT_CLINIC_OR_DEPARTMENT_OTHER): Payer: Self-pay

## 2023-02-09 VITALS — BP 132/78 | HR 81 | Temp 97.8°F | Resp 18 | Ht 62.0 in | Wt 285.2 lb

## 2023-02-09 DIAGNOSIS — F419 Anxiety disorder, unspecified: Secondary | ICD-10-CM | POA: Insufficient documentation

## 2023-02-09 DIAGNOSIS — Z79899 Other long term (current) drug therapy: Secondary | ICD-10-CM | POA: Insufficient documentation

## 2023-02-09 DIAGNOSIS — F332 Major depressive disorder, recurrent severe without psychotic features: Secondary | ICD-10-CM | POA: Insufficient documentation

## 2023-02-09 MED ORDER — QUETIAPINE 50 MG TABLET
50.0000 mg | ORAL_TABLET | Freq: Every evening | ORAL | 1 refills | Status: DC
Start: 2023-02-09 — End: 2023-04-09
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
## 2023-02-09 NOTE — Patient Instructions (Signed)
Stop trazodone.  Start Seroquel 50mg  nightly.

## 2023-03-06 ENCOUNTER — Telehealth: Payer: Self-pay | Admitting: Physician Assistant

## 2023-03-06 ENCOUNTER — Ambulatory Visit (HOSPITAL_BASED_OUTPATIENT_CLINIC_OR_DEPARTMENT_OTHER): Payer: Self-pay | Admitting: Family Medicine

## 2023-03-06 DIAGNOSIS — K047 Periapical abscess without sinus: Secondary | ICD-10-CM

## 2023-03-06 MED ORDER — CLINDAMYCIN HCL 300 MG PO CAPS: 300.0000 mg | ORAL_CAPSULE | Freq: Three times a day (TID) | ORAL | 0 refills | Status: DC

## 2023-03-06 NOTE — Progress Notes (Signed)

## 2023-03-06 NOTE — Progress Notes (Signed)

## 2023-03-10 ENCOUNTER — Ambulatory Visit (HOSPITAL_BASED_OUTPATIENT_CLINIC_OR_DEPARTMENT_OTHER): Payer: Self-pay

## 2023-03-23 ENCOUNTER — Ambulatory Visit (HOSPITAL_BASED_OUTPATIENT_CLINIC_OR_DEPARTMENT_OTHER): Payer: MEDICAID | Admitting: Family Medicine

## 2023-03-28 ENCOUNTER — Other Ambulatory Visit (HOSPITAL_BASED_OUTPATIENT_CLINIC_OR_DEPARTMENT_OTHER): Payer: Self-pay | Admitting: Family Medicine

## 2023-03-28 DIAGNOSIS — R000 Tachycardia, unspecified: Secondary | ICD-10-CM

## 2023-03-30 ENCOUNTER — Telehealth (HOSPITAL_BASED_OUTPATIENT_CLINIC_OR_DEPARTMENT_OTHER): Payer: Self-pay

## 2023-03-30 ENCOUNTER — Encounter (HOSPITAL_BASED_OUTPATIENT_CLINIC_OR_DEPARTMENT_OTHER): Payer: Self-pay

## 2023-03-30 NOTE — Telephone Encounter (Signed)
Patient called and wants provider to know the Seroquel she is on makes her sleep 2-3 days at a time when she takes it. I told her I will let the provider know and I will call her back.

## 2023-04-02 ENCOUNTER — Telehealth (HOSPITAL_BASED_OUTPATIENT_CLINIC_OR_DEPARTMENT_OTHER): Payer: Self-pay | Admitting: Family Medicine

## 2023-04-02 NOTE — Telephone Encounter (Signed)
Called pt to remind her of labs that need completed before her appointment on 04/06/23. Pt was transferred to the front to schedule a lab appointment.

## 2023-04-03 ENCOUNTER — Other Ambulatory Visit: Payer: Self-pay

## 2023-04-03 ENCOUNTER — Ambulatory Visit: Payer: MEDICAID | Attending: Family Medicine

## 2023-04-03 DIAGNOSIS — I10 Essential (primary) hypertension: Secondary | ICD-10-CM | POA: Insufficient documentation

## 2023-04-03 DIAGNOSIS — E039 Hypothyroidism, unspecified: Secondary | ICD-10-CM | POA: Insufficient documentation

## 2023-04-03 LAB — COMPREHENSIVE METABOLIC PNL, FASTING
ALBUMIN: 3.8 g/dL (ref 3.5–5.0)
ALKALINE PHOSPHATASE: 84 U/L (ref 40–110)
ALT (SGPT): 47 U/L — ABNORMAL HIGH (ref 8–22)
ANION GAP: 4 mmol/L (ref 4–13)
AST (SGOT): 32 U/L (ref 8–45)
BILIRUBIN TOTAL: 0.6 mg/dL (ref 0.3–1.3)
BUN/CREA RATIO: 12 (ref 6–22)
BUN: 10 mg/dL (ref 8–25)
CALCIUM: 9.3 mg/dL (ref 8.6–10.2)
CHLORIDE: 106 mmol/L (ref 96–111)
CO2 TOTAL: 27 mmol/L (ref 22–30)
CREATININE: 0.83 mg/dL (ref 0.60–1.05)
ESTIMATED GFR - FEMALE: >90 mL/min/BSA (ref >=60)
GLUCOSE: 99 mg/dL (ref 70–99)
POTASSIUM: 4 mmol/L (ref 3.5–5.1)
PROTEIN TOTAL: 7.3 g/dL (ref 6.4–8.3)
SODIUM: 137 mmol/L (ref 136–145)

## 2023-04-03 LAB — CBC WITH DIFF
BASOPHIL #: <0.04 10*3/uL (ref <=0.20)
BASOPHIL %: 0.4 %
EOSINOPHIL #: 0.3 10*3/uL (ref <=0.50)
EOSINOPHIL %: 5.6 %
HCT: 43.3 % (ref 34.8–46.0)
HGB: 14.8 g/dL (ref 11.5–16.0)
IMMATURE GRANULOCYTE #: <0.04 10*3/uL (ref <0.10)
IMMATURE GRANULOCYTE %: 0.2 % (ref 0.0–1.0)
LYMPHOCYTE #: 1.69 10*3/uL (ref 1.00–4.80)
LYMPHOCYTE %: 31.8 %
MCH: 31.4 pg (ref 26.0–32.0)
MCHC: 34.2 g/dL (ref 31.0–35.5)
MCV: 91.9 fL (ref 78.0–100.0)
MONOCYTE #: 0.39 10*3/uL (ref 0.20–1.10)
MONOCYTE %: 7.3 %
MPV: 9 fL (ref 8.7–12.5)
NEUTROPHIL #: 2.9 10*3/uL (ref 1.50–7.70)
NEUTROPHIL %: 54.7 %
PLATELETS: 227 10*3/uL (ref 150–400)
RBC: 4.71 10*6/uL (ref 3.85–5.22)
RDW-CV: 11.3 % — ABNORMAL LOW (ref 11.5–15.5)
WBC: 5.3 10*3/uL (ref 3.7–11.0)

## 2023-04-03 LAB — THYROID STIMULATING HORMONE (SENSITIVE TSH): TSH: 1.815 u[IU]/mL (ref 0.350–4.940)

## 2023-04-03 LAB — LIPID PANEL
CHOL/HDL RATIO: 4.5
CHOLESTEROL: 141 mg/dL (ref 100–200)
HDL CHOL: 31 mg/dL — ABNORMAL LOW (ref >=50)
LDL CALC: 87 mg/dL (ref <100)
NON-HDL: 110 mg/dL (ref <=190)
TRIGLYCERIDES: 129 mg/dL (ref <150)
VLDL CALC: 20 mg/dL (ref <30)

## 2023-04-03 LAB — THYROXINE, FREE (FREE T4): THYROXINE (T4), FREE: 1.17 ng/dL (ref 0.70–1.48)

## 2023-04-06 ENCOUNTER — Telehealth (HOSPITAL_BASED_OUTPATIENT_CLINIC_OR_DEPARTMENT_OTHER): Payer: Self-pay

## 2023-04-06 ENCOUNTER — Ambulatory Visit (HOSPITAL_BASED_OUTPATIENT_CLINIC_OR_DEPARTMENT_OTHER): Payer: MEDICAID | Admitting: Family Medicine

## 2023-04-06 NOTE — Telephone Encounter (Signed)
I advised patient per Kristina Baxter to cut the Seroquel dose in half and try that for a while and see how it goes. She said ok.

## 2023-04-09 ENCOUNTER — Encounter (HOSPITAL_BASED_OUTPATIENT_CLINIC_OR_DEPARTMENT_OTHER): Payer: Self-pay | Admitting: Family Medicine

## 2023-04-09 ENCOUNTER — Other Ambulatory Visit: Payer: Self-pay

## 2023-04-09 ENCOUNTER — Ambulatory Visit: Payer: MEDICAID | Attending: Family Medicine | Admitting: Family Medicine

## 2023-04-09 VITALS — BP 128/88 | HR 75 | Temp 98.1°F | Wt 282.0 lb

## 2023-04-09 DIAGNOSIS — F32A Depression, unspecified: Secondary | ICD-10-CM | POA: Insufficient documentation

## 2023-04-09 DIAGNOSIS — G6289 Other specified polyneuropathies: Secondary | ICD-10-CM | POA: Insufficient documentation

## 2023-04-09 DIAGNOSIS — R200 Anesthesia of skin: Secondary | ICD-10-CM | POA: Insufficient documentation

## 2023-04-09 DIAGNOSIS — I10 Essential (primary) hypertension: Secondary | ICD-10-CM | POA: Insufficient documentation

## 2023-04-09 DIAGNOSIS — E039 Hypothyroidism, unspecified: Secondary | ICD-10-CM | POA: Insufficient documentation

## 2023-04-09 DIAGNOSIS — F17210 Nicotine dependence, cigarettes, uncomplicated: Secondary | ICD-10-CM | POA: Insufficient documentation

## 2023-04-09 DIAGNOSIS — Z09 Encounter for follow-up examination after completed treatment for conditions other than malignant neoplasm: Secondary | ICD-10-CM | POA: Insufficient documentation

## 2023-04-09 DIAGNOSIS — F419 Anxiety disorder, unspecified: Secondary | ICD-10-CM | POA: Insufficient documentation

## 2023-04-09 MED ORDER — GABAPENTIN 100 MG CAPSULE
100.0000 mg | ORAL_CAPSULE | Freq: Three times a day (TID) | ORAL | 0 refills | Status: DC
Start: 2023-04-09 — End: 2023-05-07

## 2023-04-09 NOTE — Progress Notes (Signed)
[facility], [facility]  [address] [place] [address]  Operated by [facility]    Progress Note     Name: Kristina Baxter MRN:  [id number]   Date: 04/09/2023 Age: 32 y.o.       Chief Complaint: Lab Results (108mon./Denies any current concerns.Lowanda Foster Paugh, RTR/), Earache (LT.), and Hand Swelling (Bilateral hands, NKI and denies pain. Fingers seem to stay swollen lately.)    History of Present Illness:  Patient is seen today for a 4 month routine follow-up. She has been experiencing left ear pain and is concerned about swelling in her bilateral hands.     Patient states that she has been experiencing left ear pain that occurs when she goes outside in the cold. Inspection of the left ear is normal. There is no redness of the canal or TM. There is not TM bulging. Patient is not experiencing any cold-like symptoms. She has been encouraged to cover her ear when going out in the cold.     Patient has been experiencing swelling of the bilateral fingers. She states that she noticed this about two days ago when she realized that her rings were tight on her hands. She denies consuming increase sodium in her her diet. Her BP is normal. She is experiencing some swelling in the bilateral ankles. Medications were reviewed. Patient additionally reports that she is experiencing numbness and tingling in the bilateral hands, ankles and feet with increased burning pain. She states that the pain is worse at night and frequently keeps her awake. Will give patient gabapentin 100 mg three times per day and follow-up in the office in 4 weeks for medication changes.     BP in normal in the office at 128/88. Patient states that her tachycardia is controlled with current medication regimen. Her HR in the office today is 75 with a regular rhythm. TSH is normal with current dose of levothyroxine. The remainder of patients labs are stable.     Patient is working with behavioral health on symptoms of anxiety and  depression.       Health maintenance is reviewed. Will perform Annual exam at next routine follow-up visit. Patient declines influenza and pneumonia vaccines.     Tobacco cessation counseling performed for 5 minutes. Patient is a smoker. Patient advised on the adverse effects of tobacco.  Patient was instructed of the benefits of quitting.  Patient was provided information on techniques to quit and offered any support wanted in order to quit.  Patient aware the only effective way to quit smoking is to keep trying.  All questions were answered.     Review of Systems   Constitutional:  Negative for chills and fever.   HENT:  Positive for ear pain (left ear). Negative for congestion, ear discharge and sinus pain.    Eyes: Negative.    Respiratory:  Negative for cough and shortness of breath.    Cardiovascular:  Negative for chest pain, palpitations and leg swelling.   Gastrointestinal:  Positive for constipation (Relieved with stool softeners). Negative for abdominal pain and diarrhea.   Genitourinary:  Negative for dysuria, frequency and urgency.   Musculoskeletal: Negative.    Skin:  Negative for rash.   Neurological:  Negative for weakness and headaches.   Endo/Heme/Allergies: Negative.    Psychiatric/Behavioral:  Positive for depression. The patient is nervous/anxious.         Currently working with behavioral health   All other systems reviewed and are negative.  Past Medical History  Past Medical History:   Diagnosis Date    Anxiety     Depression          Past Surgical History:   Procedure Laterality Date    DENTAL SURGERY      FOOT SURGERY Right          Current Outpatient Medications   Medication Sig    albuterol sulfate (PROVENTIL HFA) 90 mcg/actuation Inhalation oral inhaler Take 1-2 Puffs by inhalation Every 6 hours as needed    etonogestreL (NEXPLANON) 68 mg Subdermal Implant 68 mg by Subdermal route One time For subdermal insertion by health care providers trained in the insertion and removal  procedure.    fluticasone propionate (FLONASE) 50 mcg/actuation Nasal Spray, Suspension Administer 1-2 Sprays into affected nostril(s)    gabapentin (NEURONTIN) 100 mg Oral Capsule Take 1 Capsule (100 mg total) by mouth Three times a day Indications: neuropathic pain    hydrOXYzine pamoate (VISTARIL) 25 mg Oral Capsule TAKE 1 CAPSULE BY MOUTH EVERY 6 TO 8 HOURS AS NEEDED FOR ANXIETY AND FOR ITCHING    levothyroxine (SYNTHROID) 75 mcg Oral Tablet Take 1 Tablet (75 mcg total) by mouth Every morning    metoprolol succinate (TOPROL-XL) 25 mg Oral Tablet Sustained Release 24 hr Take 1 Tablet (25 mg total) by mouth Once a day Take with 50 mg to equal 75 mg daily    metoprolol succinate (TOPROL-XL) 50 mg Oral Tablet Sustained Release 24 hr Take 1 tablet by mouth once daily    pantoprazole (PROTONIX) 20 mg Oral Tablet, Delayed Release (E.C.) TAKE 1 TABLET BY MOUTH TWICE DAILY BEFORE MEAL(S)     Allergies   Allergen Reactions    Fluconazole Rash    Doxycycline Hives/ Urticaria    Fish Derived Nausea/ Vomiting    Klonopin [Clonazepam] Hives/ Urticaria    Minocycline Hives/ Urticaria     Social History     Tobacco Use   Smoking Status Every Day    Current packs/day: 0.25    Average packs/day: 0.3 packs/day for 17.0 years (4.2 ttl pk-yrs)    Types: Cigarettes    Start date: 2008   Smokeless Tobacco Never       Family History  Family Medical History:       Problem Relation (Age of Onset)    Alzheimer's/Dementia Maternal Grandmother    Cancer Maternal Grandfather    Diabetes Maternal Grandfather    Heart Attack Mother, Maternal Grandmother, Maternal Grandfather    Hypertension (High Blood Pressure) Mother, Maternal Grandmother, Maternal Grandfather    Lung disease Maternal Grandmother    No Known Problems Sister    Stroke Maternal Grandfather    Thyroid Cancer Maternal Aunt              Review of Systems  All pertinent Review of System as address in the HPI.    Review of Systems Reviewed    Imaging: No results were found from the  past 30 days.    LABS REVIEWED  CBC  Diff   Lab Results   Component Value Date/Time    WBC 5.3 04/03/2023 10:07 AM    HGB 14.8 04/03/2023 10:07 AM    HCT 43.3 04/03/2023 10:07 AM    PLTCNT 227 04/03/2023 10:07 AM    RBC 4.71 04/03/2023 10:07 AM    MCV 91.9 04/03/2023 10:07 AM    MCHC 34.2 04/03/2023 10:07 AM    MCH 31.4 04/03/2023 10:07 AM    MPV 9.0  04/03/2023 10:07 AM    Lab Results   Component Value Date/Time    PMNS 54.7 04/03/2023 10:07 AM    MONOCYTES 7.3 04/03/2023 10:07 AM    BASOPHILS 0.4 04/03/2023 10:07 AM    BASOPHILS <0.04 04/03/2023 10:07 AM    PMNABS 2.90 04/03/2023 10:07 AM    LYMPHSABS 1.69 04/03/2023 10:07 AM    EOSABS 0.30 04/03/2023 10:07 AM    MONOSABS 0.39 04/03/2023 10:07 AM          COMPREHENSIVE METABOLIC PANEL   Lab Results   Component Value Date    SODIUM 137 04/03/2023    POTASSIUM 4.0 04/03/2023    CHLORIDE 106 04/03/2023    CO2 27 04/03/2023    ANIONGAP 4 04/03/2023    BUN 10 04/03/2023    CREATININE 0.83 04/03/2023    CALCIUM 9.3 04/03/2023    ALBUMIN 3.8 04/03/2023    TOTALPROTEIN 7.3 04/03/2023    ALKPHOS 84 04/03/2023    AST 32 04/03/2023    ALT 47 (H) 04/03/2023           Lab Results   Component Value Date    CHOLESTEROL 141 04/03/2023    HDLCHOL 31 (L) 04/03/2023    LDLCHOL 87 04/03/2023    TRIG 129 04/03/2023        Appointment on 04/03/2023   Component Date Value Ref Range Status    SODIUM 04/03/2023 137  136 - 145 mmol/L Final    POTASSIUM 04/03/2023 4.0  3.5 - 5.1 mmol/L Final    CHLORIDE 04/03/2023 106  96 - 111 mmol/L Final    CO2 TOTAL 04/03/2023 27  22 - 30 mmol/L Final    ANION GAP 04/03/2023 4  4 - 13 mmol/L Final    BUN 04/03/2023 10  8 - 25 mg/dL Final    CREATININE 44/06/4740 0.83  0.60 - 1.05 mg/dL Final    BUN/CREA RATIO 04/03/2023 12  6 - 22 Final    ALBUMIN 04/03/2023 3.8  3.5 - 5.0 g/dL  Final    CALCIUM 59/56/3875 9.3  8.6 - 10.2 mg/dL Final    Gadolinium-containing contrast can interfere with calcium measurement.      GLUCOSE 04/03/2023 99  70 - 99 mg/dL Final     ALKALINE PHOSPHATASE 04/03/2023 84  40 - 110 U/L Final    ALT (SGPT) 04/03/2023 47 (H)  8 - 22 U/L Final    AST (SGOT)  04/03/2023 32  8 - 45 U/L Final    BILIRUBIN TOTAL 04/03/2023 0.6  0.3 - 1.3 mg/dL Final    Naproxen therapy can falsely elevate total bilirubin levels.    PROTEIN TOTAL 04/03/2023 7.3  6.4 - 8.3 g/dL Final    ESTIMATED GFR - FEMALE 04/03/2023 >90  >=60 mL/min/BSA Final    Estimated Glomerular Filtration Rate (eGFR) is calculated using the gender-dependent CKD-EPI (2021) equation, intended for patients 37 years of age and older.    If patient sex is not documented, or if patient sex and gender are incongruent, eGFR results calculated for both female and female will be reported. Recommend correlation and careful interpretation of patient history, keeping in mind that serum creatinine is influenced by hormone therapy.    Stage, GFR, Classification   G1, 90, Normal or High   G2, 60-89, Mildly decreased   G3a, 45-59, Mildly to moderately decreased   G3b, 30-44, Moderately to severely decreased   G4, 15-29, Severely decreased   G5, <15, Kidney failure   In the absence  of kidney damage, neither G1 or G2 fulfill criteria for CKD per KDIGO.      TSH 04/03/2023 1.815  0.350 - 4.940 uIU/mL Final    Pregnant patients have trimester-specific ranges derived from this analyzer/technology. For singleton pregnancies:    First trimester, 0.15 - 2.45 uIU/mL  Second trimester, 0.35 - 3.20 uIU/mL  Third trimester, 0.40 - 4.30 uIU/mL    THYROXINE (T4), FREE 04/03/2023 1.17  0.70 - 1.48 ng/dL Final    Pregnant patients have trimester-specific reference intervals derived from a meta-analysis (Osinga et al, 2022):    First trimester: 0.85-1.45 ng/dL  Second trimester: 5.36-6.44 ng/dL  Third trimester: 0.34-7.42 ng/dL      CHOLESTEROL  59/56/3875 141  100 - 200 mg/dL Final    HDL CHOL 64/33/2951 31 (L)  >=50 mg/dL Final    TRIGLYCERIDES 04/03/2023 129  <150 mg/dL Final    LDL CALC 88/41/6606 87  <100 mg/dL Final    <301  mg/dL, Optimal  601-093 mg/dL, Near/Above Optimal  235-573 mg/dL, Borderline High  220-254 mg/dL, High  >=270 mg/dL, Very high    VLDL CALC 04/03/2023 20  <30 mg/dL Final    NON-HDL 62/37/6283 110  <=190 mg/dL Final    CHOL/HDL RATIO 04/03/2023 4.5   Final    WBC 04/03/2023 5.3  3.7 - 11.0 x10^3/uL Final    RBC 04/03/2023 4.71  3.85 - 5.22 x10^6/uL Final    HGB 04/03/2023 14.8  11.5 - 16.0 g/dL Final    HCT 15/17/6160 43.3  34.8 - 46.0 % Final    MCV 04/03/2023 91.9  78.0 - 100.0 fL Final    MCH 04/03/2023 31.4  26.0 - 32.0 pg Final    MCHC 04/03/2023 34.2  31.0 - 35.5 g/dL Final    RDW-CV 73/71/0626 11.3 (L)  11.5 - 15.5 % Final    PLATELETS 04/03/2023 227  150 - 400 x10^3/uL Final    MPV 04/03/2023 9.0  8.7 - 12.5 fL Final    NEUTROPHIL % 04/03/2023 54.7  % Final    LYMPHOCYTE % 04/03/2023 31.8  % Final    MONOCYTE % 04/03/2023 7.3  % Final    EOSINOPHIL % 04/03/2023 5.6  % Final    BASOPHIL % 04/03/2023 0.4  % Final    NEUTROPHIL # 04/03/2023 2.90  1.50 - 7.70 x10^3/uL Final    LYMPHOCYTE # 04/03/2023 1.69  1.00 - 4.80 x10^3/uL Final    MONOCYTE # 04/03/2023 0.39  0.20 - 1.10 x10^3/uL Final    EOSINOPHIL # 04/03/2023 0.30  <=0.50 x10^3/uL Final    BASOPHIL # 04/03/2023 <0.04  <=0.20 x10^3/uL Final    IMMATURE GRANULOCYTE % 04/03/2023 0.2  0.0 - 1.0 % Final    The immature granulocyte fraction (IGF) quantifies total circulating myelocytes, metamyelocytes, and promyelocytes. It is used to evaluate immune responses to infection, inflammation, or other stimuli of the bone marrow. Caution is advised in interpreting test results in neonates who normally have greater numbers of circulating immature blood cells.      IMMATURE GRANULOCYTE # 04/03/2023 <0.04  <0.10 x10^3/uL Final       The ASCVD Risk score (Arnett DK, et al., 2019) failed to calculate for the following reasons:    The 2019 ASCVD risk score is only valid for ages 30 to 68    Vitals:    04/09/23 1424   BP: 128/88   Pulse: 75   Temp: 36.7 C (98.1 F)   SpO2:  99%   Weight: 128  kg (282 lb)       Physical Examination:  Physical Exam  Vitals and nursing note reviewed.   Constitutional:       General: She is not in acute distress.     Appearance: Normal appearance. She is not ill-appearing.   HENT:      Head: Normocephalic and atraumatic.      Right Ear: Tympanic membrane, ear canal and external ear normal.      Left Ear: Tympanic membrane, ear canal and external ear normal.      Nose: Nose normal. No congestion or rhinorrhea.   Cardiovascular:      Rate and Rhythm: Normal rate and regular rhythm.      Pulses: Normal pulses.      Heart sounds: Normal heart sounds. No murmur heard.     No friction rub. No gallop.   Pulmonary:      Effort: Pulmonary effort is normal.      Breath sounds: Normal breath sounds. No wheezing, rhonchi or rales.   Musculoskeletal:         General: Normal range of motion.      Cervical back: Normal range of motion.   Skin:     General: Skin is warm and dry.   Neurological:      General: No focal deficit present.      Mental Status: She is alert and oriented to person, place, and time.   Psychiatric:         Mood and Affect: Mood normal.         Behavior: Behavior normal.          Procedures         Assessment and Plan  ENCOUNTER DIAGNOSES     ICD-10-CM   1. Follow-up exam, 3-6 months since previous exam  Z09   2. Hypertension, unspecified type  I10   3. 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 Anxiety  F41.9   5. 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     Orders Placed This Encounter    gabapentin (NEURONTIN) 100 mg Oral Capsule         (Z09) Follow-up exam, 3-6 months since previous exam  (primary encounter diagnosis)  Follow-up in 4 weeks for medication changes    (I10) Hypertension, unspecified type  Controlled with current medication regimen    (E03.9) Hypothyroidism (acquired)  Continue current dose of levothyroxine    (F41.9) Anxiety  Continue working with behavioral health    (F32.A) Depression,  unspecified depression type  Continue working with behavioral health    (G62.89) Other polyneuropathy  gabapentin (NEURONTIN) 100 mg Oral Capsule, three times per day              The patient was given the opportunity to ask questions and those questions were answered to the patient's satisfaction. The patient was encouraged to call with any additional questions or concerns.     There are no Patient Instructions on file for this visit.    Return in about 4 weeks (around 05/07/2023) for Follow-up medication changes.    Cleda Daub, FNP-BC      This note may have been partially generated using MModal Fluency Direct system, and there may be some incorrect words, spellings, and punctuation that were not noted in checking the note before saving.

## 2023-04-18 ENCOUNTER — Other Ambulatory Visit (HOSPITAL_BASED_OUTPATIENT_CLINIC_OR_DEPARTMENT_OTHER): Payer: Self-pay | Admitting: Family Medicine

## 2023-04-18 DIAGNOSIS — K219 Gastro-esophageal reflux disease without esophagitis: Secondary | ICD-10-CM

## 2023-04-24 ENCOUNTER — Telehealth (HOSPITAL_BASED_OUTPATIENT_CLINIC_OR_DEPARTMENT_OTHER): Payer: Self-pay

## 2023-04-24 ENCOUNTER — Telehealth (HOSPITAL_BASED_OUTPATIENT_CLINIC_OR_DEPARTMENT_OTHER): Payer: Self-pay | Admitting: Family Medicine

## 2023-04-24 NOTE — Telephone Encounter (Signed)
Patient called and asked if provider could send a letter for her to get food stamps. After speaking with the provider I let her know she can print off her AVS and turn that in for proof of being under her care. She said ok but that she didn't have a printer so could she come in to the office today and get the printout of the AVS. I told her yes abd I let the schedulers know she would be stopping in.

## 2023-04-24 NOTE — Telephone Encounter (Signed)
Called pt regarding letter request. She stated that she would like provider to write a letter for [facility] letting them know that she is under her care and trying to get her medical issues taken care of. She stated that she is unable to work due to her heart issues and needs the letter to state this. Let her know that if the provider is able to write the letter for her, she may need to come in and sign a consent form. Pt understands. She said that she has recently had cough, congestion, body aches and headache. She states that she has the norovirus. Let her know that we close in one hour and she may want to go to [facility] if symptoms worsen. Pt understands.

## 2023-04-24 NOTE — Telephone Encounter (Signed)
Patient called in and was requesting if Rosey Bath could write a letter to [facility] stating that she comes to this office and is trying to get her Medical issues taken care of. She also reported that she has the flu and would like a call to discuss that thanks

## 2023-04-27 ENCOUNTER — Other Ambulatory Visit (HOSPITAL_BASED_OUTPATIENT_CLINIC_OR_DEPARTMENT_OTHER): Payer: Self-pay | Admitting: Family Medicine

## 2023-04-30 ENCOUNTER — Ambulatory Visit: Payer: MEDICAID

## 2023-04-30 ENCOUNTER — Other Ambulatory Visit: Payer: Self-pay

## 2023-04-30 ENCOUNTER — Encounter (HOSPITAL_BASED_OUTPATIENT_CLINIC_OR_DEPARTMENT_OTHER): Payer: Self-pay

## 2023-04-30 DIAGNOSIS — F331 Major depressive disorder, recurrent, moderate: Secondary | ICD-10-CM | POA: Insufficient documentation

## 2023-04-30 DIAGNOSIS — F419 Anxiety disorder, unspecified: Secondary | ICD-10-CM | POA: Insufficient documentation

## 2023-04-30 DIAGNOSIS — Z79899 Other long term (current) drug therapy: Secondary | ICD-10-CM | POA: Insufficient documentation

## 2023-04-30 MED ORDER — DULOXETINE 30 MG CAPSULE,DELAYED RELEASE
30.0000 mg | DELAYED_RELEASE_CAPSULE | Freq: Every day | ORAL | 1 refills | Status: DC
Start: 2023-04-30 — End: 2023-06-12

## 2023-04-30 NOTE — Telephone Encounter (Signed)
Returned pt's call regarding the letter. Let her know that the provider is unable to write the requested letter. Pt indicated understanding.

## 2023-04-30 NOTE — Progress Notes (Signed)
BEHAVIORAL MEDICINE, Celedonio Miyamoto  [address]  [place] [address]  Operated by [facility]     Progress Note    Name: Kristina Baxter MRN:  [id number]   Date: 04/30/2023 Age: 33 y.o.      Chief Complaint:   Chief Complaint              Medication follow up depression            There were no vitals filed for this visit.    Allergies   Allergen Reactions    Fluconazole Rash    Doxycycline Hives/ Urticaria    Fish Derived Nausea/ Vomiting    Klonopin [Clonazepam] Hives/ Urticaria    Minocycline Hives/ Urticaria        History of Present Illness:    Marlaina Maturino is a 33 y.o. female who presents for outpatient psychiatric followup.  Patient is being seen via televisit, patient verified they are located in [place] and gives verbal consent. Chart reviewed. She was placed on Gabapentin by her PCP for neuropathy. When she combines it at bedtime with hydroxyzine she is sleeping really well.     Patient is asking for an antidepressant today. She is also requesting a letter stating that she is unable to work at this time due to mental health issues. This is for her to obtain food stamps.     Patient has a history of and is being treated for Anxiety and Depression.  At the patient's last visit, medication was started and changed. See current medication list.      Medication Compliance: Patient is compliant with medication.  Patient denies side effects and desires to remain on current regimen on medication.     Current Outpatient Medications   Medication Sig    albuterol sulfate (PROVENTIL HFA) 90 mcg/actuation Inhalation oral inhaler Take 1-2 Puffs by inhalation Every 6 hours as needed    DULoxetine (CYMBALTA DR) 30 mg Oral Capsule, Delayed Release(E.C.) Take 1 Capsule (30 mg total) by mouth Once a day Indications: anxiousness associated with depression    etonogestreL (NEXPLANON) 68 mg Subdermal Implant 68 mg by Subdermal route One time For subdermal insertion by health care providers trained in the insertion and removal  procedure.    fluticasone propionate (FLONASE) 50 mcg/actuation Nasal Spray, Suspension Administer 1-2 Sprays into affected nostril(s)    gabapentin (NEURONTIN) 100 mg Oral Capsule Take 1 Capsule (100 mg total) by mouth Three times a day Indications: neuropathic pain    hydrOXYzine pamoate (VISTARIL) 25 mg Oral Capsule Take 1 capsule by mouth three times daily as needed    levothyroxine (SYNTHROID) 75 mcg Oral Tablet Take 1 Tablet (75 mcg total) by mouth Every morning    metoprolol succinate (TOPROL-XL) 25 mg Oral Tablet Sustained Release 24 hr Take 1 Tablet (25 mg total) by mouth Once a day Take with 50 mg to equal 75 mg daily    metoprolol succinate (TOPROL-XL) 50 mg Oral Tablet Sustained Release 24 hr Take 1 tablet by mouth once daily    pantoprazole (PROTONIX) 20 mg Oral Tablet, Delayed Release (E.C.) TAKE 1 TABLET BY MOUTH TWICE DAILY BEFORE MEAL(S)       Bio-Psychosocial Stressors: Patient's stressors were evaluated and discussed with patient at length.  Patient states utilizing coping mechanisms such as practicing self care, grounding, mindfullness, meditating, keeping busy and relaxing to reduce severity of symptoms.     Severity of Symptoms of Depression:  5 on a scale from 1-10, with  1 being none and 10 being the worst. Patient denies SI and HI thoughts and plans today.  Targeted symptoms of depression/mood: unchanged           02/12/2022     2:03 PM 02/09/2023    10:02 AM 04/30/2023     1:51 PM   Most Recent PHQ-9 Scores   PHQ 9 Total 22 22 6          PHQ Questionnaire  Little interest or pleasure in doing things.: More than half the days  Feeling down, depressed, or hopeless: More than half the days  PHQ 2 Total: 4  Trouble falling or staying asleep, or sleeping too much.: Not at all  Feeling tired or having little energy: Not at all  Poor appetite or overeating: Not at all  Feeling bad about yourself/ that you are a failure in the past 2 weeks?: More than half the days  Trouble concentrating on things in  the past 2 weeks?: Not at all  Moving/Speaking slowly or being fidgety or restless  in the past 2 weeks?: Not at all  Thoughts that you would be better off DEAD, or of hurting yourself in some way.: Not at all  If you checked off any problems, how difficult have these problems made it for you to do your work, take care of things at home, or get along with other people?: Somewhat difficult  PHQ 9 Total: 6  Interpretation of Total Score: 5-9 Mild depression      Severity of Symptoms of Anxiety: anxiety scale: 6 on a scale from 1-10, with 1 being none and 10 being the worst.  Targeted symptoms of anxiety: improved        02/12/2022     2:03 PM 02/09/2023    10:07 AM 04/30/2023     1:52 PM   GAD-7 Questionnaire   Feeling nervous,anxious,on edge 3 3 1    Not being able to stop or control worrying 3 3 2    Worrying too much about different things 3 3 2    Trouble relaxing 3 3 1    Being so restless that it is hard to sit still 3 3 1    Becoming easily annoyed or irritable 3 3 3    Feeling afraid as if something awful might happen 2 3 0   How difficult have these problems made it for you to work, take care of things at home, or get along with other people? Somewhat difficult Extremely difficult Very difficult   Gad-7 Score Total 20 21 10    Interpretation 15 or more, severe anxiety 15 or more, severe anxiety 10-14, moderate anxiety             Mood Symptoms: No    Psychosis: No hallucinations and No delusions     Review of Systems:   Review of Systems   Psychiatric/Behavioral:  Positive for depression. The patient is nervous/anxious.         Mental Status Exam:  Physical Exam  Vitals and nursing note reviewed.   Psychiatric:         Attention and Perception: Attention and perception normal.         Mood and Affect: Affect normal. Mood is anxious and depressed.         Speech: Speech normal.         Behavior: Behavior normal. Behavior is cooperative.         Thought Content: Thought content normal.         Cognition  and Memory:  Cognition and memory normal.         Judgment: Judgment normal.           Labs:  Results in Last 18 Months   Lab Test 02/13/22  0741 02/13/22  0744 04/03/23  1007   TSH  --    < > 1.815   AST  --    < > 32   ALT  --    < > 47*   HA1C 4.8  --   --    CHOLESTEROL  --    < > 141   HDLCHOL  --    < > 31*   LDLCHOL  --    < > 87   TRIG  --    < > 129    < > = values in this interval not displayed.         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)       Rule Out/Differential Diagnosis:  BH PROTECTIVE FACTORS: sense of responsibility to loved ones, resilient - ability to cope with stress, frustration tolerance, social supports, positive therapeutic relationships, expressive of needs/emotions, good impulse control, insight into illness/situation, treatment compliance, motivated for treatment, positive educational/vocational history, resourceful, stable living environment     BH RISK FACTORS: current/past diagnosis, hopelessness, anhedonia, impulsivity, social stressors, recent relapse, frequent relapse, poor social support, unresolved trauma history, poor coping skills     Risk Factors for Suicide: Anxiety (worry, apprehension, panic), Deception, contradictions, Depression, sadness, Employment problem, Hopeless, Impulsivity, Intolerable emotional pain, Lacking social support, and Major depression    Goals:  Depression LTG:  Patient will agree to continued care plans.     Depression STG:  Patient will verbalize knowledge of medication effects, side effects, food/drug interactions, and importance of compliance.     Anxiety LTG:  Patient will identify 3 triggers to anxiety/panic attacks.     Anxiety STG:  Comply with medications as prescribed and report any side effects to staff.    Education:      Safety: Denies any thoughts of wanting to hurt self or others, not safety issue identified as of this moment.   Medication Risk: The  patient was educated about the risk, benefit, and alternative of the medication/treatment including potential side effects.  The patient is aware and verbalized understanding about them.   Compliance: The patient was educated about the need to be compliant with treatment to achieve a better therapeutic outcome.    Weight: The patient was educated about diet and exercise to maintain an adequate weight and minimize the risk of metabolic syndrome.   Substance use: Patient was counseled about the use of alcohol or any illicit drug especially while using medication.        Plan:    Medication Orders   Medications    DULoxetine (CYMBALTA DR) 30 mg Oral Capsule, Delayed Release(E.C.)     Sig: Take 1 Capsule (30 mg total) by mouth Once a day Indications: anxiousness associated with depression     Dispense:  30 Capsule     Refill:  1   Start Cymbalta 30mg  daily for depression/anxiety/neuropathy pain    Letter written for [facility] indicating she should be able to go to work in one month from today's date.   Return in about 4 weeks (around 05/28/2023), or if symptoms worsen or fail to improve, for In Person Visit, Video Visit.  Sooner if necessary.  Advised patient to call or use MyChart with any questions or concerns. Given business card.   SSRI: The risks and benefits of Serotonin Selective Reuptake Inhibitors were discussed, including but not limited to: nausea and gastrointestinal difficulties, the risk of activation and manic shifts, other common side effects, and the black box warning on suicidality.  Patient was advised to call 911 and/or go to the nearest emergency department should her condition worsen or thoughts of suicide or homicide occur.    Start Time: 1404  End Time: 1413    Marijean Niemann, APRN,PMHNP-BC  04/30/2023, 14:04

## 2023-04-30 NOTE — Telephone Encounter (Signed)
Patient called and was inquiring about letter she requested and wanted to know if Kristina Baxter was going to do this, she would like a call back

## 2023-05-07 ENCOUNTER — Ambulatory Visit: Payer: MEDICAID | Attending: Family Medicine | Admitting: Family Medicine

## 2023-05-07 ENCOUNTER — Encounter (HOSPITAL_BASED_OUTPATIENT_CLINIC_OR_DEPARTMENT_OTHER): Payer: Self-pay | Admitting: Family Medicine

## 2023-05-07 DIAGNOSIS — J329 Chronic sinusitis, unspecified: Secondary | ICD-10-CM | POA: Insufficient documentation

## 2023-05-07 DIAGNOSIS — G6289 Other specified polyneuropathies: Secondary | ICD-10-CM | POA: Insufficient documentation

## 2023-05-07 DIAGNOSIS — G629 Polyneuropathy, unspecified: Secondary | ICD-10-CM | POA: Insufficient documentation

## 2023-05-07 DIAGNOSIS — B9689 Other specified bacterial agents as the cause of diseases classified elsewhere: Secondary | ICD-10-CM | POA: Insufficient documentation

## 2023-05-07 MED ORDER — GABAPENTIN 300 MG CAPSULE
300.0000 mg | ORAL_CAPSULE | Freq: Every evening | ORAL | 0 refills | Status: DC
Start: 2023-05-07 — End: 2023-07-13

## 2023-05-07 MED ORDER — CEFDINIR 300 MG CAPSULE
300.0000 mg | ORAL_CAPSULE | Freq: Two times a day (BID) | ORAL | 0 refills | Status: DC
Start: 2023-05-07 — End: 2023-06-04

## 2023-05-07 MED ORDER — METHYLPREDNISOLONE 4 MG TABLETS IN A DOSE PACK
ORAL_TABLET | ORAL | 0 refills | Status: DC
Start: 2023-05-07 — End: 2023-06-04

## 2023-05-07 NOTE — Progress Notes (Signed)
[facility]  TELEMEDICINE CONSULTATION       Name: Kristina Baxter  MRN: [id number]  DOB: [birth year]    Date of Service:  05/07/2023    TELEMEDICINE DOCUMENTATION:  Patient Location:  MyChart video visit from home address: [address] [place] [facility] [address]   Patient/family aware of provider location:  yes  Patient/family consent for telemedicine:  yes  Examination observed and performed by:  Cleda Daub, FNP-BC    Chief Complaint:    Chief Complaint   Patient presents with    Medication follow up       History of Present Illness:  Kristina Baxter is a 33 y.o. female  who was seen today via MyChart visit to follow-up on addition of gabapentin for symptoms of polyneuropathy.  Patient states that she has only been taking 100 mg of gabapentin at night prior to bed.  She states that this has been helping her symptoms.  Have encouraged her to increase the medication to 200 mg for 3 days and then to 300 mg at hours sleep thereafter.  Patient states that she was also recently started on Cymbalta by behavior health to help with depression, anxiety and musculoskeletal and joint pain.    Patient reports a 2 day history of cold-like symptoms.  She states that her she began with a sore throat today.  She denies fever or chills, cough or chest congestion.      Patient Active Problem List    Diagnosis    Peripheral neuropathy    Bacterial sinusitis    Numbness and tingling in both hands    Follow-up exam, 3-6 months since previous exam    Hypothyroidism (acquired)    Iron deficiency anemia    History of anemia    Morbid obesity with BMI of 45.0-49.9, adult (CMS HCC)    Need for hepatitis C screening test    Non-alcoholic fatty liver disease    Other insomnia    Gastroesophageal reflux disease without esophagitis    Tachycardia    Anxiety    Screening for HIV (human immunodeficiency virus)    High blood pressure    Nexplanon in place    Secondary syphilis    Depression    Palpitations    SOB (shortness of breath) on exertion      Past Medical History:   Diagnosis Date    Anxiety     Depression          Past Surgical History:   Procedure Laterality Date    DENTAL SURGERY      FOOT SURGERY Right          Current Outpatient Medications   Medication Sig    albuterol sulfate (PROVENTIL HFA) 90 mcg/actuation Inhalation oral inhaler Take 1-2 Puffs by inhalation Every 6 hours as needed    cefdinir (OMNICEF) 300 mg Oral Capsule Take 1 Capsule (300 mg total) by mouth Twice daily for 7 days    DULoxetine (CYMBALTA DR) 30 mg Oral Capsule, Delayed Release(E.C.) Take 1 Capsule (30 mg total) by mouth Once a day Indications: anxiousness associated with depression    etonogestreL (NEXPLANON) 68 mg Subdermal Implant 68 mg by Subdermal route One time For subdermal insertion by health care providers trained in the insertion and removal procedure.    fluticasone propionate (FLONASE) 50 mcg/actuation Nasal Spray, Suspension Administer 1-2 Sprays into affected nostril(s)    gabapentin (NEURONTIN) 300 mg Oral Capsule Take 1 Capsule (300 mg total) by mouth Every night Indications: neuropathic  pain    hydrOXYzine pamoate (VISTARIL) 25 mg Oral Capsule Take 1 capsule by mouth three times daily as needed    levothyroxine (SYNTHROID) 75 mcg Oral Tablet Take 1 Tablet (75 mcg total) by mouth Every morning    Methylprednisolone (MEDROL DOSEPACK) 4 mg Oral Tablets, Dose Pack Take as instructed.    metoprolol succinate (TOPROL-XL) 25 mg Oral Tablet Sustained Release 24 hr Take 1 Tablet (25 mg total) by mouth Once a day Take with 50 mg to equal 75 mg daily    metoprolol succinate (TOPROL-XL) 50 mg Oral Tablet Sustained Release 24 hr Take 1 tablet by mouth once daily    pantoprazole (PROTONIX) 20 mg Oral Tablet, Delayed Release (E.C.) TAKE 1 TABLET BY MOUTH TWICE DAILY BEFORE MEAL(S)     Allergies   Allergen Reactions    Fluconazole Rash    Doxycycline Hives/ Urticaria    Fish Derived Nausea/ Vomiting    Klonopin [Clonazepam] Hives/ Urticaria    Minocycline Hives/ Urticaria      Family Medical History:       Problem Relation (Age of Onset)    Alzheimer's/Dementia Maternal Grandmother    Cancer Maternal Grandfather    Diabetes Maternal Grandfather    Heart Attack Mother, Maternal Grandmother, Maternal Grandfather    Hypertension (High Blood Pressure) Mother, Maternal Grandmother, Maternal Grandfather    Lung disease Maternal Grandmother    No Known Problems Sister    Stroke Maternal Grandfather    Thyroid Cancer Maternal Aunt            Social History     Tobacco Use    Smoking status: Every Day     Current packs/day: 0.25     Average packs/day: 0.3 packs/day for 17.0 years (4.3 ttl pk-yrs)     Types: Cigarettes     Start date: 2008    Smokeless tobacco: Never   Substance Use Topics    Alcohol use: Not Currently         Review of Systems:  Ears, nose, mouth, throat, and face: positive for nasal congestion    Physical Exam:  There were no vitals taken for this visit.      General:  appears in good health, comfortable    Data Reviewed:    Assessment/Plan:    ICD-10-CM    1. Other polyneuropathy  G62.89 gabapentin (NEURONTIN) 300 mg Oral Capsule      2. Bacterial sinusitis  J32.9 Methylprednisolone (MEDROL DOSEPACK) 4 mg Oral Tablets, Dose Pack    B96.89 cefdinir (OMNICEF) 300 mg Oral Capsule         Orders Placed This Encounter    gabapentin (NEURONTIN) 300 mg Oral Capsule    Methylprednisolone (MEDROL DOSEPACK) 4 mg Oral Tablets, Dose Pack    cefdinir (OMNICEF) 300 mg Oral Capsule       Follow up:  Return in about 4 weeks (around 06/04/2023) for Follow-up medication changes.      Cleda Daub, FNP-BC  05/07/2023, 16:04

## 2023-05-13 ENCOUNTER — Ambulatory Visit: Payer: MEDICAID

## 2023-05-13 ENCOUNTER — Encounter (HOSPITAL_BASED_OUTPATIENT_CLINIC_OR_DEPARTMENT_OTHER): Payer: Self-pay

## 2023-05-13 ENCOUNTER — Other Ambulatory Visit: Payer: Self-pay

## 2023-05-13 DIAGNOSIS — F331 Major depressive disorder, recurrent, moderate: Secondary | ICD-10-CM | POA: Insufficient documentation

## 2023-05-13 DIAGNOSIS — F419 Anxiety disorder, unspecified: Secondary | ICD-10-CM | POA: Insufficient documentation

## 2023-05-13 DIAGNOSIS — Z79899 Other long term (current) drug therapy: Secondary | ICD-10-CM | POA: Insufficient documentation

## 2023-05-13 NOTE — Progress Notes (Signed)
BEHAVIORAL MEDICINE, Celedonio Miyamoto  [address]  [place] [address]  Operated by [facility]     Progress Note    Name: [name] MRN:  [id number]   Date: 05/13/2023 Age: 33 y.o.       TELEMEDICINE DOCUMENTATION:    Patient Location:  MyChart video visit from home address: [address] [place]  [place] [address]    Patient/family aware of provider location:  yes  Patient/family consent for telemedicine:  yes  Examination observed and performed by:  Marijean Niemann, APRN,PMHNP-BC      Chief Complaint:   Chief Complaint              Medication follow up depression            There were no vitals filed for this visit.    Allergies   Allergen Reactions    Fluconazole Rash    Doxycycline Hives/ Urticaria    Fish Derived Nausea/ Vomiting    Klonopin [Clonazepam] Hives/ Urticaria    Minocycline Hives/ Urticaria        History of Present Illness:    Kristina Baxter is a 33 y.o. female who presents for outpatient psychiatric followup.  Patient is being seen via televisit, patient verified they are located in [place] and gives verbal consent. Chart reviewed. At her last visit, she was started on Cymbalta. "I can see a difference in a good way." She has noticed a reduced appetite but no other side effects. She's been completing diamond paintings recently, giving them as gifts, and is showing a sense of pride of the accomplishment.     Her PCP recently increased her Gabapentin.     She is asking for ADHD medication today.     Patient has a history of and is being treated for Anxiety and Depression.  At the patient's last visit, medication was started and changed. See current medication list.      Medication Compliance: Patient is compliant with medication.  Patient denies side effects and desires to remain on current regimen on medication.     Current Outpatient Medications   Medication Sig    albuterol sulfate (PROVENTIL HFA) 90 mcg/actuation Inhalation oral inhaler Take 1-2 Puffs by inhalation Every 6 hours as  needed    cefdinir (OMNICEF) 300 mg Oral Capsule Take 1 Capsule (300 mg total) by mouth Twice daily for 7 days    DULoxetine (CYMBALTA DR) 30 mg Oral Capsule, Delayed Release(E.C.) Take 1 Capsule (30 mg total) by mouth Once a day Indications: anxiousness associated with depression    etonogestreL (NEXPLANON) 68 mg Subdermal Implant 68 mg by Subdermal route One time For subdermal insertion by health care providers trained in the insertion and removal procedure.    fluticasone propionate (FLONASE) 50 mcg/actuation Nasal Spray, Suspension Administer 1-2 Sprays into affected nostril(s)    gabapentin (NEURONTIN) 300 mg Oral Capsule Take 1 Capsule (300 mg total) by mouth Every night Indications: neuropathic pain    hydrOXYzine pamoate (VISTARIL) 25 mg Oral Capsule Take 1 capsule by mouth three times daily as needed    levothyroxine (SYNTHROID) 75 mcg Oral Tablet Take 1 Tablet (75 mcg total) by mouth Every morning    Methylprednisolone (MEDROL DOSEPACK) 4 mg Oral Tablets, Dose Pack Take as instructed.    metoprolol succinate (TOPROL-XL) 25 mg Oral Tablet Sustained Release 24 hr Take 1 Tablet (25 mg total) by mouth Once a day Take with 50 mg to equal 75 mg daily    metoprolol  succinate (TOPROL-XL) 50 mg Oral Tablet Sustained Release 24 hr Take 1 tablet by mouth once daily    pantoprazole (PROTONIX) 20 mg Oral Tablet, Delayed Release (E.C.) TAKE 1 TABLET BY MOUTH TWICE DAILY BEFORE MEAL(S)       Bio-Psychosocial Stressors: Patient's stressors were evaluated and discussed with patient at length.  Patient states utilizing coping mechanisms such as practicing self care, grounding, mindfullness, meditating, keeping busy and relaxing to reduce severity of symptoms.     Severity of Symptoms of Depression:  5 on a scale from 1-10, with 1 being none and 10 being the worst. Patient denies SI and HI thoughts and plans today.  Targeted symptoms of depression/mood: unchanged           02/09/2023    10:02 AM 04/30/2023     1:51 PM  05/13/2023    11:10 AM   Most Recent PHQ-9 Scores   PHQ 9 Total 22 6 7          PHQ Questionnaire  Little interest or pleasure in doing things.: Not at all  Feeling down, depressed, or hopeless: Not at all  PHQ 2 Total: 0  Trouble falling or staying asleep, or sleeping too much.: Not at all  Feeling tired or having little energy: Not at all  Poor appetite or overeating: Several Days  Feeling bad about yourself/ that you are a failure in the past 2 weeks?: Not at all  Trouble concentrating on things in the past 2 weeks?: Nearly every day  Moving/Speaking slowly or being fidgety or restless  in the past 2 weeks?: Nearly every day  Thoughts that you would be better off DEAD, or of hurting yourself in some way.: Not at all  If you checked off any problems, how difficult have these problems made it for you to do your work, take care of things at home, or get along with other people?: Very difficult  PHQ 9 Total: 7  Interpretation of Total Score: 5-9 Mild depression      Severity of Symptoms of Anxiety: anxiety scale: 6 on a scale from 1-10, with 1 being none and 10 being the worst.  Targeted symptoms of anxiety: improved        02/12/2022     2:03 PM 02/09/2023    10:07 AM 04/30/2023     1:52 PM 05/13/2023    11:11 AM   GAD-7 Questionnaire   Feeling nervous,anxious,on edge 3 3 1  0   Not being able to stop or control worrying 3 3 2 1    Worrying too much about different things 3 3 2 2    Trouble relaxing 3 3 1  0   Being so restless that it is hard to sit still 3 3 1  0   Becoming easily annoyed or irritable 3 3 3 1    Feeling afraid as if something awful might happen 2 3 0 0   How difficult have these problems made it for you to work, take care of things at home, or get along with other people? Somewhat difficult Extremely difficult Very difficult Somewhat difficult   Gad-7 Score Total 20 21 10 4    Interpretation 15 or more, severe anxiety 15 or more, severe anxiety 10-14, moderate anxiety 0-4, normal          Mood Symptoms:  No    Psychosis: No hallucinations and No delusions     Review of Systems:   Review of Systems   Psychiatric/Behavioral:  Positive for depression. The patient is  nervous/anxious.         Mental Status Exam:  Physical Exam  Vitals and nursing note reviewed.   Psychiatric:         Attention and Perception: Attention and perception normal.         Mood and Affect: Affect normal. Mood is anxious and depressed.         Speech: Speech normal.         Behavior: Behavior normal. Behavior is cooperative.         Thought Content: Thought content normal.         Cognition and Memory: Cognition and memory normal.         Judgment: Judgment normal.           Labs:  Results in Last 18 Months   Lab Test 02/13/22  0741 02/13/22  0744 04/03/23  1007   TSH  --    < > 1.815   AST  --    < > 32   ALT  --    < > 47*   HA1C 4.8  --   --    CHOLESTEROL  --    < > 141   HDLCHOL  --    < > 31*   LDLCHOL  --    < > 87   TRIG  --    < > 129    < > = values in this interval not displayed.         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)         Rule Out/Differential Diagnosis:  BH PROTECTIVE FACTORS: sense of responsibility to loved ones, resilient - ability to cope with stress, frustration tolerance, social supports, positive therapeutic relationships, expressive of needs/emotions, good impulse control, insight into illness/situation, treatment compliance, motivated for treatment, positive educational/vocational history, resourceful, stable living environment     BH RISK FACTORS: current/past diagnosis, hopelessness, anhedonia, impulsivity, social stressors, recent relapse, frequent relapse, poor social support, unresolved trauma history, poor coping skills     Risk Factors for Suicide: Anxiety (worry, apprehension, panic), Deception, contradictions, Depression, sadness, Employment problem, Hopeless, Impulsivity, Intolerable emotional pain, Lacking  social support, and Major depression    Goals:  Depression LTG:  Patient will agree to continued care plans.     Depression STG:  Patient will verbalize knowledge of medication effects, side effects, food/drug interactions, and importance of compliance.     Anxiety LTG:  Patient will identify 3 triggers to anxiety/panic attacks.     Anxiety STG:  Comply with medications as prescribed and report any side effects to staff.    Education:      Safety: Denies any thoughts of wanting to hurt self or others, not safety issue identified as of this moment.   Medication Risk: The patient was educated about the risk, benefit, and alternative of the medication/treatment including potential side effects.  The patient is aware and verbalized understanding about them.   Compliance: The patient was educated about the need to be compliant with treatment to achieve a better therapeutic outcome.    Weight: The patient was educated about diet and exercise to maintain an adequate weight and minimize the risk of metabolic syndrome.   Substance use: Patient was counseled about the use of alcohol or any illicit drug especially while using medication.        Plan:    Medication Orders  No Medications ordered   Cymbalta 30mg  daily  Patient requests ADHD medication, however, she is showing improvement on focus and attention without  No follow-ups on file. Sooner if necessary.  Advised patient to call or use MyChart with any questions or concerns. Given business card.   SSRI: The risks and benefits of Serotonin Selective Reuptake Inhibitors were discussed, including but not limited to: nausea and gastrointestinal difficulties, the risk of activation and manic shifts, other common side effects, and the black box warning on suicidality.  Patient was advised to call 911 and/or go to the nearest emergency department should her condition worsen or thoughts of suicide or homicide occur.    Start Time: 1123  End Time: 1134    Marijean Niemann,  APRN,PMHNP-BC  04/30/2023, 14:04

## 2023-06-04 ENCOUNTER — Ambulatory Visit: Payer: MEDICAID | Attending: Family Medicine | Admitting: Family Medicine

## 2023-06-04 ENCOUNTER — Encounter (HOSPITAL_BASED_OUTPATIENT_CLINIC_OR_DEPARTMENT_OTHER): Payer: Self-pay | Admitting: Family Medicine

## 2023-06-04 DIAGNOSIS — R059 Cough, unspecified: Secondary | ICD-10-CM | POA: Insufficient documentation

## 2023-06-04 DIAGNOSIS — G629 Polyneuropathy, unspecified: Secondary | ICD-10-CM | POA: Insufficient documentation

## 2023-06-04 MED ORDER — PREDNISONE 10 MG TABLET
ORAL_TABLET | ORAL | 0 refills | Status: DC
Start: 2023-06-04 — End: 2023-07-17

## 2023-06-04 MED ORDER — PROMETHAZINE-DM 6.25 MG-15 MG/5 ML ORAL SYRUP
5.0000 mL | ORAL_SOLUTION | Freq: Four times a day (QID) | ORAL | 0 refills | Status: DC | PRN
Start: 2023-06-04 — End: 2023-07-27

## 2023-06-04 NOTE — Progress Notes (Signed)
[facility]  TELEMEDICINE CONSULTATION       Name: Kristina Baxter  MRN: [id number]  DOB: 1991/01/02    Date of Service:  06/04/2023    TELEMEDICINE DOCUMENTATION:  Patient Location:  MyChart video visit from home address: [address] [place] [facility] [address]   Patient/family aware of provider location:  yes  Patient/family consent for telemedicine:  yes  Examination observed and performed by:  Cleda Daub, FNP-BC    Chief Complaint:    Chief Complaint   Patient presents with    Medication Check     Patient gabapentin increased to 300 mg nightly       History of Present Illness:  Kristina Baxter is a 33 y.o. female  who is seen today to follow-up on increase in gabapentin 300 mg nightly. Patient states that the increase in the medication has improved her neuropathy symptoms. She is satisfied with her current dose and time of medication.     Patient is experiencing a subacute cough from a recent sinus infection. The cough is persistent and she is having trouble sleeping at night. Will give patient a prednisone taper and medication for cough at night.       Patient Active Problem List    Diagnosis    Cough    Peripheral neuropathy    Bacterial sinusitis    Numbness and tingling in both hands    Follow-up exam, 3-6 months since previous exam    Hypothyroidism (acquired)    Iron deficiency anemia    History of anemia    Morbid obesity with BMI of 45.0-49.9, adult (CMS HCC)    Need for hepatitis C screening test    Non-alcoholic fatty liver disease    Other insomnia    Gastroesophageal reflux disease without esophagitis    Tachycardia    Anxiety    Screening for HIV (human immunodeficiency virus)    High blood pressure    Nexplanon in place    Secondary syphilis    Depression    Palpitations    SOB (shortness of breath) on exertion     Past Medical History:   Diagnosis Date    Anxiety     Depression          Past Surgical History:   Procedure Laterality Date    DENTAL SURGERY      FOOT SURGERY Right          Current  Outpatient Medications   Medication Sig    albuterol sulfate (PROVENTIL HFA) 90 mcg/actuation Inhalation oral inhaler Take 1-2 Puffs by inhalation Every 6 hours as needed    DULoxetine (CYMBALTA DR) 30 mg Oral Capsule, Delayed Release(E.C.) Take 1 Capsule (30 mg total) by mouth Once a day Indications: anxiousness associated with depression    etonogestreL (NEXPLANON) 68 mg Subdermal Implant 68 mg by Subdermal route One time For subdermal insertion by health care providers trained in the insertion and removal procedure.    fluticasone propionate (FLONASE) 50 mcg/actuation Nasal Spray, Suspension Administer 1-2 Sprays into affected nostril(s)    gabapentin (NEURONTIN) 300 mg Oral Capsule Take 1 Capsule (300 mg total) by mouth Every night Indications: neuropathic pain    hydrOXYzine pamoate (VISTARIL) 25 mg Oral Capsule Take 1 capsule by mouth three times daily as needed    levothyroxine (SYNTHROID) 75 mcg Oral Tablet Take 1 Tablet (75 mcg total) by mouth Every morning    metoprolol succinate (TOPROL-XL) 25 mg Oral Tablet Sustained Release 24 hr Take 1 Tablet (  25 mg total) by mouth Once a day Take with 50 mg to equal 75 mg daily    metoprolol succinate (TOPROL-XL) 50 mg Oral Tablet Sustained Release 24 hr Take 1 tablet by mouth once daily    pantoprazole (PROTONIX) 20 mg Oral Tablet, Delayed Release (E.C.) TAKE 1 TABLET BY MOUTH TWICE DAILY BEFORE MEAL(S)    predniSONE (DELTASONE) 10 mg Oral Tablet Take 2 Tablets (20 mg total) by mouth Twice daily for 5 days, THEN 1 Tablet (10 mg total) Twice daily for 5 days, THEN 1 Tablet (10 mg total) Once a day for 5 days, THEN 0.5 Tablets (5 mg total) Once a day for 5 days.    promethazine-dextromethorphan (PHENERGAN-DM) 6.25-15 mg/5 mL Oral Syrup Take 5 mL by mouth Four times a day as needed for Cough Indications: cough     Allergies   Allergen Reactions    Fluconazole Rash    Doxycycline Hives/ Urticaria    Fish Derived Nausea/ Vomiting    Klonopin [Clonazepam] Hives/ Urticaria     Minocycline Hives/ Urticaria     Family Medical History:       Problem Relation (Age of Onset)    Alzheimer's/Dementia Maternal Grandmother    Cancer Maternal Grandfather    Diabetes Maternal Grandfather    Heart Attack Mother, Maternal Grandmother, Maternal Grandfather    Hypertension (High Blood Pressure) Mother, Maternal Grandmother, Maternal Grandfather    Lung disease Maternal Grandmother    No Known Problems Sister    Stroke Maternal Grandfather    Thyroid Cancer Maternal Aunt            Social History     Tobacco Use    Smoking status: Every Day     Current packs/day: 0.25     Average packs/day: 0.3 packs/day for 17.1 years (4.3 ttl pk-yrs)     Types: Cigarettes     Start date: 2008    Smokeless tobacco: Never   Substance Use Topics    Alcohol use: Not Currently         Review of Systems:  Constitutional: negative for None  Ears, nose, mouth, throat, and face: negative  Respiratory: positive for cough    Physical Exam:  There were no vitals taken for this visit.      General:  appears in good health, comfortable    Data Reviewed:    Assessment/Plan:    ICD-10-CM    1. Peripheral neuropathy  G62.9       2. Cough, unspecified type  R05.9 promethazine-dextromethorphan (PHENERGAN-DM) 6.25-15 mg/5 mL Oral Syrup     predniSONE (DELTASONE) 10 mg Oral Tablet         Orders Placed This Encounter    promethazine-dextromethorphan (PHENERGAN-DM) 6.25-15 mg/5 mL Oral Syrup    predniSONE (DELTASONE) 10 mg Oral Tablet       Follow up:  Return in about 3 months (around 09/01/2023) for Routine follow-up with labs.      Cleda Daub, FNP-BC  06/04/2023, 14:06

## 2023-06-12 ENCOUNTER — Ambulatory Visit: Payer: MEDICAID

## 2023-06-12 ENCOUNTER — Encounter (HOSPITAL_BASED_OUTPATIENT_CLINIC_OR_DEPARTMENT_OTHER): Payer: Self-pay

## 2023-06-12 DIAGNOSIS — F419 Anxiety disorder, unspecified: Secondary | ICD-10-CM | POA: Insufficient documentation

## 2023-06-12 DIAGNOSIS — Z79899 Other long term (current) drug therapy: Secondary | ICD-10-CM | POA: Insufficient documentation

## 2023-06-12 DIAGNOSIS — F331 Major depressive disorder, recurrent, moderate: Secondary | ICD-10-CM | POA: Insufficient documentation

## 2023-06-12 DIAGNOSIS — G4709 Other insomnia: Secondary | ICD-10-CM | POA: Insufficient documentation

## 2023-06-12 MED ORDER — HYDROXYZINE PAMOATE 25 MG CAPSULE
50.0000 mg | ORAL_CAPSULE | Freq: Every evening | ORAL | Status: DC | PRN
Start: 2023-06-12 — End: 2023-07-17

## 2023-06-12 MED ORDER — DULOXETINE 60 MG CAPSULE,DELAYED RELEASE
60.0000 mg | DELAYED_RELEASE_CAPSULE | Freq: Every day | ORAL | 2 refills | Status: DC
Start: 2023-06-12 — End: 2023-08-28

## 2023-06-12 NOTE — Progress Notes (Signed)
 BEHAVIORAL MEDICINE, [facility]  [address]  [place] [address]  Operated by [facility]     Progress Note    Name: Kristina Baxter MRN:  [id number]   Date: 06/12/2023 Age: 33 y.o.       TELEMEDICINE DOCUMENTATION:    Patient Location:  MyChart video visit from home address: [address] [place]  [place] [address]    Patient/family aware of provider location:  yes  Patient/family consent for telemedicine:  yes  Examination observed and performed by:  Marijean Niemann, APRN,PMHNP-BC      Chief Complaint:   Chief Complaint              Major Depression             There were no vitals filed for this visit.    Allergies   Allergen Reactions    Fluconazole Rash    Doxycycline Hives/ Urticaria    Fish Derived Nausea/ Vomiting    Klonopin [Clonazepam] Hives/ Urticaria    Minocycline Hives/ Urticaria        History of Present Illness:    Kristina Baxter is a 33 y.o. female who presents for outpatient psychiatric followup.  Patient is being seen via televisit, patient verified they are located in [place] and gives verbal consent. Chart reviewed. Patient reports her boyfriend broke up with her last week. "I think he's seeing someone else."     She has also is being treated with prednisone which is making her mood worse and causing insomnia.       Patient has a history of and is being treated for Anxiety and Depression.  At the patient's last visit, medication was started and changed. See current medication list.      Medication Compliance: Patient is compliant with medication.  Patient denies side effects and desires to remain on current regimen on medication.     Current Outpatient Medications   Medication Sig    albuterol sulfate (PROVENTIL HFA) 90 mcg/actuation Inhalation oral inhaler Take 1-2 Puffs by inhalation Every 6 hours as needed    DULoxetine (CYMBALTA DR) 60 mg Oral Capsule, Delayed Release(E.C.) Take 1 Capsule (60 mg total) by mouth Once a day Indications: anxiousness associated with depression     etonogestreL (NEXPLANON) 68 mg Subdermal Implant 68 mg by Subdermal route One time For subdermal insertion by health care providers trained in the insertion and removal procedure.    fluticasone propionate (FLONASE) 50 mcg/actuation Nasal Spray, Suspension Administer 1-2 Sprays into affected nostril(s)    gabapentin (NEURONTIN) 300 mg Oral Capsule Take 1 Capsule (300 mg total) by mouth Every night Indications: neuropathic pain    hydrOXYzine pamoate (VISTARIL) 25 mg Oral Capsule Take 2 Capsules (50 mg total) by mouth Every night as needed for Anxiety for up to 45 days Indications: anxious    levothyroxine (SYNTHROID) 75 mcg Oral Tablet Take 1 Tablet (75 mcg total) by mouth Every morning    metoprolol succinate (TOPROL-XL) 25 mg Oral Tablet Sustained Release 24 hr Take 1 Tablet (25 mg total) by mouth Once a day Take with 50 mg to equal 75 mg daily    metoprolol succinate (TOPROL-XL) 50 mg Oral Tablet Sustained Release 24 hr Take 1 tablet by mouth once daily    pantoprazole (PROTONIX) 20 mg Oral Tablet, Delayed Release (E.C.) TAKE 1 TABLET BY MOUTH TWICE DAILY BEFORE MEAL(S)    predniSONE (DELTASONE) 10 mg Oral Tablet Take 2 Tablets (20 mg total) by mouth Twice daily for  5 days, THEN 1 Tablet (10 mg total) Twice daily for 5 days, THEN 1 Tablet (10 mg total) Once a day for 5 days, THEN 0.5 Tablets (5 mg total) Once a day for 5 days.    promethazine-dextromethorphan (PHENERGAN-DM) 6.25-15 mg/5 mL Oral Syrup Take 5 mL by mouth Four times a day as needed for Cough Indications: cough       Bio-Psychosocial Stressors: Patient's stressors were evaluated and discussed with patient at length.  Patient states utilizing coping mechanisms such as practicing self care, grounding, mindfullness, meditating, keeping busy and relaxing to reduce severity of symptoms.     Severity of Symptoms of Depression:  5 on a scale from 1-10, with 1 being none and 10 being the worst. Patient denies SI and HI thoughts and plans today.  Targeted  symptoms of depression/mood: unchanged           04/30/2023     1:51 PM 05/13/2023    11:10 AM 06/12/2023    11:14 AM   Most Recent PHQ-9 Scores   PHQ 9 Total 6 7 12          PHQ Questionnaire  Little interest or pleasure in doing things.: Nearly every day  Feeling down, depressed, or hopeless: Several Days  PHQ 2 Total: 4  Trouble falling or staying asleep, or sleeping too much.: Not at all  Feeling tired or having little energy: Several Days  Poor appetite or overeating: More than half the days  Feeling bad about yourself/ that you are a failure in the past 2 weeks?: Several Days  Trouble concentrating on things in the past 2 weeks?: Several Days  Moving/Speaking slowly or being fidgety or restless  in the past 2 weeks?: Nearly every day  Thoughts that you would be better off DEAD, or of hurting yourself in some way.: Not at all  PHQ 9 Total: 12      Severity of Symptoms of Anxiety: anxiety scale: 6 on a scale from 1-10, with 1 being none and 10 being the worst.  Targeted symptoms of anxiety: improved        02/12/2022     2:03 PM 02/09/2023    10:07 AM 04/30/2023     1:52 PM 05/13/2023    11:11 AM 06/12/2023    11:16 AM   GAD-7 Questionnaire   Feeling nervous,anxious,on edge 3 3 1  0 1   Not being able to stop or control worrying 3 3 2 1 3    Worrying too much about different things 3 3 2 2 2    Trouble relaxing 3 3 1  0 1   Being so restless that it is hard to sit still 3 3 1  0 0   Becoming easily annoyed or irritable 3 3 3 1  0   Feeling afraid as if something awful might happen 2 3 0 0 0   How difficult have these problems made it for you to work, take care of things at home, or get along with other people? Somewhat difficult Extremely difficult Very difficult Somewhat difficult Somewhat difficult   Gad-7 Score Total 20 21 10 4 7    Interpretation 15 or more, severe anxiety 15 or more, severe anxiety 10-14, moderate anxiety 0-4, normal 5-9, mild anxiety          Mood Symptoms: No    Psychosis: No hallucinations and No  delusions     Review of Systems:   Review of Systems   Psychiatric/Behavioral:  Positive for depression. The patient is  nervous/anxious.         Mental Status Exam:  Physical Exam  Vitals and nursing note reviewed.   Psychiatric:         Attention and Perception: Attention and perception normal.         Mood and Affect: Affect normal. Mood is anxious and depressed.         Speech: Speech normal.         Behavior: Behavior normal. Behavior is cooperative.         Thought Content: Thought content normal.         Cognition and Memory: Cognition and memory normal.         Judgment: Judgment normal.           Labs:  Results in Last 18 Months   Lab Test 02/13/22  0741 02/13/22  0744 04/03/23  1007   TSH  --    < > 1.815   AST  --    < > 32   ALT  --    < > 47*   HA1C 4.8  --   --    CHOLESTEROL  --    < > 141   HDLCHOL  --    < > 31*   LDLCHOL  --    < > 87   TRIG  --    < > 129    < > = values in this interval not displayed.         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 (CMS HCC)  (primary encounter diagnosis)    (G47.09) Other insomnia    (F41.9) Anxiety        Rule Out/Differential Diagnosis:  BH PROTECTIVE FACTORS: sense of responsibility to loved ones, resilient - ability to cope with stress, frustration tolerance, social supports, positive therapeutic relationships, expressive of needs/emotions, good impulse control, insight into illness/situation, treatment compliance, motivated for treatment, positive educational/vocational history, resourceful, stable living environment     BH RISK FACTORS: current/past diagnosis, hopelessness, anhedonia, impulsivity, social stressors, recent relapse, frequent relapse, poor social support, unresolved trauma history, poor coping skills     Risk Factors for Suicide: Anxiety (worry, apprehension, panic), Deception, contradictions, Depression, sadness, Employment problem, Hopeless, Impulsivity, Intolerable emotional pain, Lacking social support,  and Major depression    Goals:  Depression LTG:  Patient will agree to continued care plans.     Depression STG:  Patient will verbalize knowledge of medication effects, side effects, food/drug interactions, and importance of compliance.     Anxiety LTG:  Patient will identify 3 triggers to anxiety/panic attacks.     Anxiety STG:  Comply with medications as prescribed and report any side effects to staff.    Education:      Safety: Denies any thoughts of wanting to hurt self or others, not safety issue identified as of this moment.   Medication Risk: The patient was educated about the risk, benefit, and alternative of the medication/treatment including potential side effects.  The patient is aware and verbalized understanding about them.   Compliance: The patient was educated about the need to be compliant with treatment to achieve a better therapeutic outcome.    Weight: The patient was educated about diet and exercise to maintain an adequate weight and minimize the risk of metabolic syndrome.   Substance use: Patient was counseled about the use of alcohol or any illicit drug especially while using medication.        Plan:  Medication Orders   Medications    DULoxetine (CYMBALTA DR) 60 mg Oral Capsule, Delayed Release(E.C.)     Sig: Take 1 Capsule (60 mg total) by mouth Once a day Indications: anxiousness associated with depression     Dispense:  30 Capsule     Refill:  2    hydrOXYzine pamoate (VISTARIL) 25 mg Oral Capsule     Sig: Take 2 Capsules (50 mg total) by mouth Every night as needed for Anxiety for up to 45 days Indications: anxious   Increase Cymbalta 60mg  daily  Increase your hydroxyzine to 50mg  for sleep. No refill needed at this time.   Return in about 4 weeks (around 07/10/2023), or if symptoms worsen or fail to improve, for In Person Visit, Video Visit. Sooner if necessary.  Advised patient to call or use MyChart with any questions or concerns. Given business card.   SSRI: The risks and benefits of  Serotonin Selective Reuptake Inhibitors were discussed, including but not limited to: nausea and gastrointestinal difficulties, the risk of activation and manic shifts, other common side effects, and the black box warning on suicidality.  Patient was advised to call 911 and/or go to the nearest emergency department should her condition worsen or thoughts of suicide or homicide occur.    Start Time: 1140  End Time: 1159    Marijean Niemann, APRN,PMHNP-BC  04/30/2023, 14:04

## 2023-07-10 ENCOUNTER — Other Ambulatory Visit (HOSPITAL_BASED_OUTPATIENT_CLINIC_OR_DEPARTMENT_OTHER): Payer: Self-pay | Admitting: Family Medicine

## 2023-07-10 DIAGNOSIS — R000 Tachycardia, unspecified: Secondary | ICD-10-CM

## 2023-07-10 DIAGNOSIS — G6289 Other specified polyneuropathies: Secondary | ICD-10-CM

## 2023-07-16 ENCOUNTER — Other Ambulatory Visit: Payer: Self-pay

## 2023-07-17 ENCOUNTER — Other Ambulatory Visit: Payer: Self-pay

## 2023-07-17 ENCOUNTER — Other Ambulatory Visit (HOSPITAL_BASED_OUTPATIENT_CLINIC_OR_DEPARTMENT_OTHER): Payer: Self-pay

## 2023-07-17 ENCOUNTER — Ambulatory Visit: Payer: MEDICAID

## 2023-07-17 ENCOUNTER — Encounter (HOSPITAL_BASED_OUTPATIENT_CLINIC_OR_DEPARTMENT_OTHER): Payer: Self-pay

## 2023-07-17 DIAGNOSIS — Z79899 Other long term (current) drug therapy: Secondary | ICD-10-CM | POA: Insufficient documentation

## 2023-07-17 DIAGNOSIS — F331 Major depressive disorder, recurrent, moderate: Secondary | ICD-10-CM | POA: Insufficient documentation

## 2023-07-17 DIAGNOSIS — K219 Gastro-esophageal reflux disease without esophagitis: Secondary | ICD-10-CM

## 2023-07-17 DIAGNOSIS — F411 Generalized anxiety disorder: Secondary | ICD-10-CM | POA: Insufficient documentation

## 2023-07-17 MED ORDER — HYDROXYZINE PAMOATE 25 MG CAPSULE
75.0000 mg | ORAL_CAPSULE | Freq: Every evening | ORAL | Status: AC | PRN
Start: 2023-07-17 — End: 2023-08-31

## 2023-07-17 NOTE — Telephone Encounter (Signed)
 Last scheduled appointment with you was 06/04/2023.    Currently scheduled future appointment is 09/09/2023    Patient has been seen within the last year: Yes.    Confirmed preferred pharmacy for this refill encounter is   Preferred Pharmacy       [facility] [address] [place] - [address]    [address] [place] [address]    Phone: [phone] Fax: [phone]    Hours: Not open 24 hours                                        .     Adrienne Horning, [place]  07/17/2023, 08:51

## 2023-07-17 NOTE — Progress Notes (Signed)
 BEHAVIORAL MEDICINE, [facility]  [address]  [place] [address]  Operated by [facility]     Progress Note    Name: Gracia Saggese MRN:  [id number]   Date: 07/17/2023 Age: 33 y.o.       TELEMEDICINE DOCUMENTATION:    Patient Location:  MyChart video visit from home address: [address] [place]  [place] [address]    Patient/family aware of provider location:  yes  Patient/family consent for telemedicine:  yes  Examination observed and performed by:  Eda Gone, APRN,PMHNP-BC      Chief Complaint:   Chief Complaint              Medication follow up depression            There were no vitals filed for this visit.    Allergies   Allergen Reactions    Fluconazole  Rash    Doxycycline Hives/ Urticaria    Fish Derived Nausea/ Vomiting    Klonopin [Clonazepam] Hives/ Urticaria    Minocycline Hives/ Urticaria        History of Present Illness:    Kristina Baxter is a 33 y.o. female who presents for outpatient psychiatric followup.  Patient is being seen via televisit, patient verified they are located in [place] and gives verbal consent. Chart reviewed. Patient reports the increase in her Cymbalta  is helpful.      She is having difficulty falling asleep. She takes hydroxyzine  at 2200 and is often not falling asleep until 0200.    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     Patient has a history of and is being treated for Anxiety and Depression.  At the patient's last visit, medication was started and changed. See current medication list.      Medication Compliance: Patient is compliant with medication.  Patient denies side effects and desires to remain on current regimen on medication.     Current Outpatient Medications   Medication Sig    albuterol  sulfate (PROVENTIL  HFA) 90 mcg/actuation Inhalation oral inhaler Take 1-2 Puffs by inhalation Every 6 hours as needed    DULoxetine  (CYMBALTA  DR) 60 mg Oral Capsule, Delayed Release(E.C.) Take 1 Capsule (60 mg  total) by mouth Once a day Indications: anxiousness associated with depression    etonogestreL (NEXPLANON) 68 mg Subdermal Implant 68 mg by Subdermal route One time For subdermal insertion by health care providers trained in the insertion and removal procedure.    fluticasone propionate (FLONASE) 50 mcg/actuation Nasal Spray, Suspension Administer 1-2 Sprays into affected nostril(s)    gabapentin  (NEURONTIN ) 300 mg Oral Capsule TAKE 1 CAPSULE BY MOUTH ONCE DAILY NIGHTLY    hydrOXYzine  pamoate (VISTARIL ) 25 mg Oral Capsule Take 2 Capsules (50 mg total) by mouth Every night as needed for Anxiety for up to 45 days Indications: anxious    levothyroxine  (SYNTHROID) 75 mcg Oral Tablet TAKE 1 TABLET BY MOUTH ONCE DAILY IN THE MORNING    metoprolol  succinate (TOPROL -XL) 25 mg Oral Tablet Sustained Release 24 hr Take 1 Tablet (25 mg total) by mouth Once a day Take with 50 mg to equal 75 mg daily    metoprolol  succinate (TOPROL -XL) 50 mg Oral Tablet Sustained Release 24 hr Take 1 tablet by mouth once daily    pantoprazole  (PROTONIX ) 20 mg Oral Tablet, Delayed Release (E.C.) TAKE 1 TABLET BY MOUTH TWICE DAILY BEFORE MEAL(S)    promethazine -dextromethorphan (PHENERGAN -DM) 6.25-15 mg/5 mL Oral Syrup  Take 5 mL by mouth Four times a day as needed for Cough Indications: cough       Bio-Psychosocial Stressors: Patient's stressors were evaluated and discussed with patient at length.  Patient states utilizing coping mechanisms such as practicing self care, grounding, mindfullness, meditating, keeping busy and relaxing to reduce severity of symptoms.     Severity of Symptoms of Depression:  5 on a scale from 1-10, with 1 being none and 10 being the worst. Patient denies SI and HI thoughts and plans today.  Targeted symptoms of depression/mood: unchanged           05/13/2023    11:10 AM 06/12/2023    11:14 AM 07/17/2023    10:51 AM   Most Recent PHQ-9 Scores   PHQ 9 Total 7 12 4          PHQ Questionnaire  Little interest or pleasure in  doing things.: Several Days  Feeling down, depressed, or hopeless: Several Days  PHQ 2 Total: 2  Trouble falling or staying asleep, or sleeping too much.: Not at all  Feeling tired or having little energy: Not at all  Poor appetite or overeating: Not at all  Feeling bad about yourself/ that you are a failure in the past 2 weeks?: Not at all  Trouble concentrating on things in the past 2 weeks?: Several Days  Moving/Speaking slowly or being fidgety or restless  in the past 2 weeks?: Several Days  Thoughts that you would be better off DEAD, or of hurting yourself in some way.: Not at all  If you checked off any problems, how difficult have these problems made it for you to do your work, take care of things at home, or get along with other people?: Somewhat difficult  PHQ 9 Total: 4  Interpretation of Total Score: 0-4 No depression      Severity of Symptoms of Anxiety: anxiety scale: 6 on a scale from 1-10, with 1 being none and 10 being the worst.  Targeted symptoms of anxiety: improved        02/12/2022     2:03 PM 02/09/2023    10:07 AM 04/30/2023     1:52 PM 05/13/2023    11:11 AM 06/12/2023    11:16 AM 07/17/2023    10:51 AM   GAD-7 Questionnaire   Feeling nervous,anxious,on edge 3 3 1  0 1 1   Not being able to stop or control worrying 3 3 2 1 3 1    Worrying too much about different things 3 3 2 2 2 1    Trouble relaxing 3 3 1  0 1 0   Being so restless that it is hard to sit still 3 3 1  0 0 0   Becoming easily annoyed or irritable 3 3 3 1  0 0   Feeling afraid as if something awful might happen 2 3 0 0 0 0   How difficult have these problems made it for you to work, take care of things at home, or get along with other people? Somewhat difficult Extremely difficult Very difficult Somewhat difficult Somewhat difficult Somewhat difficult   Gad-7 Score Total 20 21 10 4 7 3    Interpretation 15 or more, severe anxiety 15 or more, severe anxiety 10-14, moderate anxiety 0-4, normal 5-9, mild anxiety 0-4, normal          Mood  Symptoms: No    Psychosis: No hallucinations and No delusions     Review of Systems:   Review of Systems  Psychiatric/Behavioral:  Positive for depression. The patient is nervous/anxious.         Mental Status Exam:  Physical Exam  Vitals and nursing note reviewed.   Psychiatric:         Attention and Perception: Attention and perception normal.         Mood and Affect: Affect normal. Mood is anxious and depressed.         Speech: Speech normal.         Behavior: Behavior normal. Behavior is cooperative.         Thought Content: Thought content normal.         Cognition and Memory: Cognition and memory normal.         Judgment: Judgment normal.           Labs:  Results in Last 18 Months   Lab Test 02/13/22  0741 02/13/22  0744 04/03/23  1007   TSH  --    < > 1.815   AST  --    < > 32   ALT  --    < > 47*   HA1C 4.8  --   --    CHOLESTEROL  --    < > 141   HDLCHOL  --    < > 31*   LDLCHOL  --    < > 87   TRIG  --    < > 129    < > = values in this interval not displayed.         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 (CMS HCC)  (primary encounter diagnosis)    (F41.1) GAD (generalized anxiety disorder)          Rule Out/Differential Diagnosis:  BH PROTECTIVE FACTORS: sense of responsibility to loved ones, resilient - ability to cope with stress, frustration tolerance, social supports, positive therapeutic relationships, expressive of needs/emotions, good impulse control, insight into illness/situation, treatment compliance, motivated for treatment, positive educational/vocational history, resourceful, stable living environment     BH RISK FACTORS: current/past diagnosis, hopelessness, anhedonia, impulsivity, social stressors, recent relapse, frequent relapse, poor social support, unresolved trauma history, poor coping skills     Risk Factors for Suicide: Anxiety (worry, apprehension, panic), Deception, contradictions, Depression, sadness, Employment problem, Hopeless,  Impulsivity, Intolerable emotional pain, Lacking social support, and Major depression    Goals:  Depression LTG:  Patient will agree to continued care plans.     Depression STG:  Patient will verbalize knowledge of medication effects, side effects, food/drug interactions, and importance of compliance.     Anxiety LTG:  Patient will identify 3 triggers to anxiety/panic attacks.     Anxiety STG:  Comply with medications as prescribed and report any side effects to staff.    Education:      Safety: Denies any thoughts of wanting to hurt self or others, not safety issue identified as of this moment.   Medication Risk: The patient was educated about the risk, benefit, and alternative of the medication/treatment including potential side effects.  The patient is aware and verbalized understanding about them.   Compliance: The patient was educated about the need to be compliant with treatment to achieve a better therapeutic outcome.    Weight: The patient was educated about diet and exercise to maintain an adequate weight and minimize the risk of metabolic syndrome.   Substance use: Patient was counseled about the use of alcohol or any illicit drug especially while using medication.  Plan:    Medication Orders   No Medications ordered     Increase your hydroxyzine  to 75mg  for sleep. No refill needed at this time.   No follow-ups on file. Sooner if necessary.  Advised patient to call or use MyChart with any questions or concerns. Given business card.   SSRI: The risks and benefits of Serotonin Selective Reuptake Inhibitors were discussed, including but not limited to: nausea and gastrointestinal difficulties, the risk of activation and manic shifts, other common side effects, and the black box warning on suicidality.  Patient was advised to call 911 and/or go to the nearest emergency department should her condition worsen or thoughts of suicide or homicide occur.    Start Time: 1056  End Time:  1112    Eda Gone,  APRN,PMHNP-BC  04/30/2023, 14:04

## 2023-07-23 ENCOUNTER — Other Ambulatory Visit (HOSPITAL_BASED_OUTPATIENT_CLINIC_OR_DEPARTMENT_OTHER): Payer: Self-pay | Admitting: Family Medicine

## 2023-07-23 DIAGNOSIS — G6289 Other specified polyneuropathies: Secondary | ICD-10-CM

## 2023-07-23 NOTE — Telephone Encounter (Signed)
 Pt would like refill Gabapentin  to [facility]

## 2023-07-23 NOTE — Telephone Encounter (Signed)
 Called pt regarding medications. Let her know that her gabapentin  was sent in last week and is ready for her at [facility]. Pt indicated understanding.

## 2023-07-27 ENCOUNTER — Other Ambulatory Visit (HOSPITAL_BASED_OUTPATIENT_CLINIC_OR_DEPARTMENT_OTHER): Payer: Self-pay | Admitting: Family Medicine

## 2023-07-27 ENCOUNTER — Encounter (HOSPITAL_BASED_OUTPATIENT_CLINIC_OR_DEPARTMENT_OTHER): Payer: Self-pay | Admitting: Family Medicine

## 2023-07-27 DIAGNOSIS — B9689 Other specified bacterial agents as the cause of diseases classified elsewhere: Secondary | ICD-10-CM

## 2023-07-27 DIAGNOSIS — R059 Cough, unspecified: Secondary | ICD-10-CM

## 2023-07-27 MED ORDER — AZITHROMYCIN 250 MG TABLET
ORAL_TABLET | ORAL | 0 refills | Status: AC
Start: 2023-07-27 — End: ?

## 2023-07-27 MED ORDER — PROMETHAZINE-DM 6.25 MG-15 MG/5 ML ORAL SYRUP
5.0000 mL | ORAL_SOLUTION | Freq: Four times a day (QID) | ORAL | 0 refills | Status: AC | PRN
Start: 2023-07-27 — End: ?

## 2023-08-18 ENCOUNTER — Other Ambulatory Visit (HOSPITAL_BASED_OUTPATIENT_CLINIC_OR_DEPARTMENT_OTHER): Payer: Self-pay | Admitting: Family Medicine

## 2023-08-18 DIAGNOSIS — Z0000 Encounter for general adult medical examination without abnormal findings: Secondary | ICD-10-CM

## 2023-08-26 ENCOUNTER — Other Ambulatory Visit (HOSPITAL_BASED_OUTPATIENT_CLINIC_OR_DEPARTMENT_OTHER): Payer: Self-pay | Admitting: Family Medicine

## 2023-08-26 ENCOUNTER — Telehealth (HOSPITAL_BASED_OUTPATIENT_CLINIC_OR_DEPARTMENT_OTHER): Payer: Self-pay

## 2023-08-26 DIAGNOSIS — F419 Anxiety disorder, unspecified: Secondary | ICD-10-CM

## 2023-08-26 DIAGNOSIS — F331 Major depressive disorder, recurrent, moderate: Secondary | ICD-10-CM

## 2023-08-26 DIAGNOSIS — G6289 Other specified polyneuropathies: Secondary | ICD-10-CM

## 2023-08-26 MED ORDER — GABAPENTIN 300 MG CAPSULE
300.0000 mg | ORAL_CAPSULE | Freq: Every day | ORAL | 0 refills | Status: AC
Start: 2023-08-26 — End: ?

## 2023-08-26 NOTE — Telephone Encounter (Signed)
 Patient is requesting a refill of this be sent to her [facility] in [place] thanks

## 2023-08-26 NOTE — Telephone Encounter (Addendum)
 Please let patient know that the script is sent to the below pharmacy. And I send my prayers to her and her mother.         Patient stated that she is currently in [place]  visiting and she is out of her DULoxitine and wanting to see if the refill can be sent to [facility] on   [address] [place] [address]    She states she is visiting with her mom as she is not doing well and will schedule her follow up as soon as she is back in the area.

## 2023-08-28 ENCOUNTER — Encounter (HOSPITAL_BASED_OUTPATIENT_CLINIC_OR_DEPARTMENT_OTHER): Payer: Self-pay

## 2023-08-28 MED ORDER — DULOXETINE 60 MG CAPSULE,DELAYED RELEASE
60.0000 mg | DELAYED_RELEASE_CAPSULE | Freq: Every day | ORAL | 0 refills | Status: AC
Start: 2023-08-28 — End: 2023-11-26

## 2023-09-02 ENCOUNTER — Ambulatory Visit (HOSPITAL_BASED_OUTPATIENT_CLINIC_OR_DEPARTMENT_OTHER): Payer: Self-pay

## 2023-09-09 ENCOUNTER — Ambulatory Visit: Payer: MEDICAID | Admitting: Family Medicine

## 2023-09-19 ENCOUNTER — Telehealth: Payer: Self-pay | Admitting: Nurse Practitioner

## 2023-09-19 DIAGNOSIS — L732 Hidradenitis suppurativa: Secondary | ICD-10-CM

## 2023-09-19 MED ORDER — CLINDAMYCIN PHOS (TWICE-DAILY) 1 % EX GEL: Freq: Two times a day (BID) | CUTANEOUS | 0 refills | Status: DC

## 2023-09-19 NOTE — Progress Notes (Signed)
 E-Visit for Skin infection  We are sorry that you are not feeling well. Here is how we plan to help!  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  It develops as a result of bacteria entering under the skin. Little red spots and/or bleeding can be seen in skin, and tiny surface sacs containing fluid can occur. Fever can be present. Cellulitis is almost always on one side of a body, and the lower limbs are the most common site of involvement.   I have prescribed:  clindamycin  gel since you have an allergy to doxycycline   HOME CARE:  Take your medications as ordered and take all of them, even if the skin irritation appears to be healing.   GET HELP RIGHT AWAY IF:  Symptoms that don't begin to go away within 48 hours. Severe redness persists or worsens If the area turns color, spreads or swells. If it blisters and opens, develops yellow-brown crust or bleeds. You develop a fever or chills. If the pain increases or becomes unbearable.  Are unable to keep fluids and food down.  MAKE SURE YOU   Understand these instructions. Will watch your condition. Will get help right away if you are not doing well or get worse.  Thank you for choosing an e-visit.  Your e-visit answers were reviewed by a board certified advanced clinical practitioner to complete your personal care plan. Depending upon the condition, your plan could have included both over the counter or prescription medications.  Please review your pharmacy choice. Make sure the pharmacy is open so you can pick up prescription now. If there is a problem, you may contact your provider through [employer] and have the prescription routed to another pharmacy.  Your safety is important to us . If you have drug allergies check your prescription carefully.   For the next 24 hours you can use MyChart to ask questions about today's visit, request a non-urgent call back, or ask for a work or school  excuse. You will get an email in the next two days asking about your experience. I hope that your e-visit has been valuable and will speed your recovery.

## 2023-09-19 NOTE — Progress Notes (Signed)
 I have spent 5 minutes in review of e-visit questionnaire, review and updating patient chart, medical decision making and response to patient.   Claiborne Rigg, NP

## 2023-09-21 ENCOUNTER — Ambulatory Visit (HOSPITAL_BASED_OUTPATIENT_CLINIC_OR_DEPARTMENT_OTHER): Payer: Self-pay

## 2023-09-21 ENCOUNTER — Other Ambulatory Visit (HOSPITAL_BASED_OUTPATIENT_CLINIC_OR_DEPARTMENT_OTHER): Payer: Self-pay | Admitting: Family Medicine

## 2023-09-21 DIAGNOSIS — R000 Tachycardia, unspecified: Secondary | ICD-10-CM

## 2023-10-03 DIAGNOSIS — J069 Acute upper respiratory infection, unspecified: Secondary | ICD-10-CM | POA: Diagnosis not present

## 2023-10-27 ENCOUNTER — Telehealth: Payer: Self-pay

## 2023-10-27 DIAGNOSIS — Z760 Encounter for issue of repeat prescription: Secondary | ICD-10-CM | POA: Diagnosis not present

## 2023-10-27 NOTE — Telephone Encounter (Unsigned)
 Copied from CRM [id number]. Topic: Clinical - Medication Refill >> Oct 27, 2023 11:36 AM Tiffini S wrote: Medication: Levothyroxin 75mg , Metoprololer 50mg , and Hydroxyzine- Pamoape 25mg  three times as needed   Has the patient contacted their pharmacy? No, states no refills, was prescribed when she was in [place]   (Agent: If no, request that the patient contact the pharmacy for the refill. If patient does not wish to contact the pharmacy document the reason why and proceed with request.) (Agent: If yes, when and what did the pharmacy advise?)  This is the patient's preferred pharmacy:  [facility] [address], [place] - [address] [place] [address] Phone: [phone] Fax: [phone]   Is this the correct pharmacy for this prescription? Yes If no, delete pharmacy and type the correct one.   Has the prescription been filled recently? No  Is the patient out of the medication? Yes  Has the patient been seen for an appointment in the last year OR does the patient have an upcoming appointment? Yes  Can we respond through MyChart? Yes  Agent: Please be advised that Rx refills may take up to 3 business days. We ask that you follow-up with your pharmacy.

## 2023-11-17 ENCOUNTER — Encounter (HOSPITAL_COMMUNITY): Payer: Self-pay

## 2023-11-17 ENCOUNTER — Emergency Department (HOSPITAL_COMMUNITY)

## 2023-11-17 ENCOUNTER — Emergency Department (HOSPITAL_COMMUNITY)
Admission: EM | Admit: 2023-11-17 | Discharge: 2023-11-17 | Disposition: A | Discharge status: Home / Self Care | Attending: Emergency Medicine | Admitting: Emergency Medicine

## 2023-11-17 ENCOUNTER — Other Ambulatory Visit: Payer: Self-pay

## 2023-11-17 DIAGNOSIS — S6991XA Unspecified injury of right wrist, hand and finger(s), initial encounter: Secondary | ICD-10-CM | POA: Diagnosis not present

## 2023-11-17 DIAGNOSIS — S0083XA Contusion of other part of head, initial encounter: Secondary | ICD-10-CM | POA: Insufficient documentation

## 2023-11-17 DIAGNOSIS — S0993XA Unspecified injury of face, initial encounter: Secondary | ICD-10-CM | POA: Diagnosis not present

## 2023-11-17 DIAGNOSIS — S8991XA Unspecified injury of right lower leg, initial encounter: Secondary | ICD-10-CM | POA: Diagnosis not present

## 2023-11-17 DIAGNOSIS — S20211A Contusion of right front wall of thorax, initial encounter: Secondary | ICD-10-CM | POA: Insufficient documentation

## 2023-11-17 DIAGNOSIS — S60221A Contusion of right hand, initial encounter: Secondary | ICD-10-CM | POA: Diagnosis not present

## 2023-11-17 DIAGNOSIS — K029 Dental caries, unspecified: Secondary | ICD-10-CM | POA: Diagnosis not present

## 2023-11-17 DIAGNOSIS — Y92410 Unspecified street and highway as the place of occurrence of the external cause: Secondary | ICD-10-CM | POA: Diagnosis not present

## 2023-11-17 DIAGNOSIS — Z041 Encounter for examination and observation following transport accident: Secondary | ICD-10-CM | POA: Diagnosis not present

## 2023-11-17 MED ORDER — IBUPROFEN 800 MG PO TABS: 800.0000 mg | ORAL_TABLET | Freq: Three times a day (TID) | ORAL | 0 refills | Status: DC | PRN
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
## 2023-11-17 NOTE — Discharge Instructions (Addendum)
 Ice to affected areas.  Tylenol and ibuprofen  for pain.  Follow-up with your regular doctor.  Return to the emergency department if any worsening or concerning symptoms
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
## 2023-11-19 ENCOUNTER — Ambulatory Visit: Payer: Self-pay
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
## 2023-11-20 ENCOUNTER — Ambulatory Visit: Payer: Self-pay

## 2023-11-21 ENCOUNTER — Encounter (HOSPITAL_COMMUNITY): Payer: Self-pay

## 2023-11-21 ENCOUNTER — Emergency Department (HOSPITAL_COMMUNITY)

## 2023-11-21 ENCOUNTER — Emergency Department (HOSPITAL_COMMUNITY)
Admission: EM | Admit: 2023-11-21 | Discharge: 2023-11-21 | Disposition: A | Discharge status: Home / Self Care | Attending: Emergency Medicine | Admitting: Emergency Medicine

## 2023-11-21 ENCOUNTER — Other Ambulatory Visit: Payer: Self-pay

## 2023-11-21 DIAGNOSIS — M25571 Pain in right ankle and joints of right foot: Secondary | ICD-10-CM | POA: Insufficient documentation

## 2023-11-21 DIAGNOSIS — S060X0A Concussion without loss of consciousness, initial encounter: Secondary | ICD-10-CM | POA: Diagnosis not present

## 2023-11-21 DIAGNOSIS — S0990XA Unspecified injury of head, initial encounter: Secondary | ICD-10-CM | POA: Diagnosis not present

## 2023-11-21 DIAGNOSIS — Y92410 Unspecified street and highway as the place of occurrence of the external cause: Secondary | ICD-10-CM | POA: Insufficient documentation

## 2023-11-21 DIAGNOSIS — S060X0D Concussion without loss of consciousness, subsequent encounter: Secondary | ICD-10-CM | POA: Diagnosis not present

## 2023-11-21 DIAGNOSIS — R519 Headache, unspecified: Secondary | ICD-10-CM | POA: Diagnosis not present

## 2023-11-21 DIAGNOSIS — S99911A Unspecified injury of right ankle, initial encounter: Secondary | ICD-10-CM | POA: Diagnosis not present

## 2023-11-21 DIAGNOSIS — H9311 Tinnitus, right ear: Secondary | ICD-10-CM | POA: Insufficient documentation

## 2023-11-21 DIAGNOSIS — M25512 Pain in left shoulder: Secondary | ICD-10-CM | POA: Insufficient documentation

## 2023-11-21 MED ORDER — OXYCODONE-ACETAMINOPHEN 5-325 MG PO TABS
1.0000 | ORAL_TABLET | Freq: Once | ORAL | Status: AC
  Administered 2023-11-21: 1 via ORAL
  Filled 2023-11-21: qty 1

## 2023-11-21 MED ORDER — METHOCARBAMOL 500 MG PO TABS: 500.0000 mg | ORAL_TABLET | Freq: Three times a day (TID) | ORAL | 0 refills | Status: DC

## 2023-11-21 MED ORDER — HYDROCODONE-ACETAMINOPHEN 5-325 MG PO TABS: ORAL_TABLET | ORAL | 0 refills | Status: DC
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
## 2023-11-21 NOTE — ED Provider Notes (Signed)
  EMERGENCY DEPARTMENT AT [facility] Provider Note   CSN: [id number] Arrival date & time: 11/21/23  8096     Patient presents with: Multiple Complaints    Diane Stokes is a 33 y.o. female.  {Add pertinent medical, surgical, social history, OB history to HPI:32947} HPI      Diane Stokes is a 33 y.o. female who presents to the Emergency Department complaining of complaining of    Prior to Admission medications   Medication Sig Start Date End Date Taking? Authorizing Provider  albuterol  (VENTOLIN  HFA) 108 (90 Base) MCG/ACT inhaler Inhale 1-2 puffs into the lungs every 6 (six) hours as needed. 07/07/22   Vivienne Delon HERO, PA-C  Cetirizine  HCl 10 MG CAPS Take 1 capsule (10 mg total) by mouth daily. 12/26/18   Wieters, Hallie C, PA-C  clindamycin  (CLEOCIN ) 300 MG capsule Take 1 capsule (300 mg total) by mouth 3 (three) times daily. 03/06/23   Vivienne Delon HERO, PA-C  clindamycin  (CLINDAGEL) 1 % gel Apply topically 2 (two) times daily. 09/19/23   Fleming, Zelda W, NP  cyclobenzaprine  (FLEXERIL ) 10 MG tablet Take 1 tablet (10 mg total) by mouth 2 (two) times daily as needed for muscle spasms. 04/18/19   Corlis Burnard DEL, NP  fluticasone  (FLONASE ) 50 MCG/ACT nasal spray Place 1-2 sprays into both nostrils daily for 7 days. 12/26/18 01/02/19  Wieters, Hallie C, PA-C  hydrOXYzine (VISTARIL) 25 MG capsule TAKE 1 CAPSULE BY MOUTH EVERY 6 TO 8 HOURS AS NEEDED FOR ANXIETY AND FOR ITCHING 08/14/18   [provider]  ibuprofen  (ADVIL ) 800 MG tablet Take 1 tablet (800 mg total) by mouth every 8 (eight) hours as needed. 11/17/23   Butler, Michael C, MD  metoprolol tartrate (LOPRESSOR) 25 MG tablet Take by mouth. 04/30/16   [provider]  Multiple Vitamin (MULTIVITAMIN) tablet Take 1 tablet by mouth every morning.     [provider]  naproxen  (NAPROSYN ) 500 MG tablet Take 1 tablet (500 mg total) by mouth 2 (two) times daily with a meal. 01/05/23   Burnette,  Delon HERO, PA-C    Allergies: Doxycycline, Klonopin [clonazepam], Minocycline, and Diflucan  Esten.[email or web address] ]    Review of Systems  Updated Vital Signs BP 126/89   Pulse 99   Temp 98.3 F (36.8 C) (Oral)   Resp 18   Ht 5' 6 (1.676 m)   Wt 106 kg   SpO2 98%   BMI 37.72 kg/m   Physical Exam  (all labs ordered are listed, but only abnormal results are displayed) Labs Reviewed - No data to display  EKG: None  Radiology: No results found.  {Document cardiac monitor, telemetry assessment procedure when appropriate:32947} Procedures   Medications Ordered in the ED  oxyCODONE -acetaminophen  (PERCOCET/ROXICET) 5-325 MG per tablet 1 tablet (has no administration in time range)      {Click here for ABCD2, HEART and other calculators REFRESH Note before signing:1}                              Medical Decision Making Amount and/or Complexity of Data Reviewed Radiology: ordered.  Risk Prescription drug management.   ***  {Document critical care time when appropriate  Document review of labs and clinical decision tools ie CHADS2VASC2, etc  Document your independent review of radiology images and any outside records  Document your discussion with family members, caretakers and with consultants  Document social determinants of health affecting pt's  care  Document your decision making why or why not admission, treatments were needed:32947:::1}   Final diagnoses:  None    ED Discharge Orders     None

## 2023-11-21 NOTE — Discharge Instructions (Signed)
 Take medication as directed.  May cause drowsiness, do not operate machinery or drive while taking the medication.  Continue your ibuprofen  as directed with food.  Please follow-up with your primary care provider for recheck.  Return to the emergency department for any new or worsening symptoms.

## 2023-11-23 ENCOUNTER — Ambulatory Visit

## 2023-11-27 ENCOUNTER — Ambulatory Visit

## 2023-11-27 DIAGNOSIS — F411 Generalized anxiety disorder: Secondary | ICD-10-CM | POA: Diagnosis not present

## 2023-12-03 ENCOUNTER — Encounter

## 2023-12-04 ENCOUNTER — Telehealth: Admitting: Family Medicine

## 2023-12-04 DIAGNOSIS — J3489 Other specified disorders of nose and nasal sinuses: Secondary | ICD-10-CM

## 2023-12-04 DIAGNOSIS — K047 Periapical abscess without sinus: Secondary | ICD-10-CM

## 2023-12-04 MED ORDER — CLINDAMYCIN HCL 300 MG PO CAPS: 300.0000 mg | ORAL_CAPSULE | Freq: Three times a day (TID) | ORAL | 0 refills | Status: AC

## 2023-12-04 NOTE — Patient Instructions (Signed)
 Dental Pain: What It Means  Dental pain is often a sign that something is wrong with your teeth or gums. You can also have pain after a dental treatment. If you have dental pain, it's important to contact your dental care provider, especially if you don't know what's causing the pain.  Dental pain may hurt a lot or a little. It can be caused by many things, including: Tooth decay. This is also called cavities or caries. Infection. An abscess. This is when the inner part of the tooth is filled with pus. An injury or a crack in the tooth. Gum disease or gums that move back and expose the root of a tooth. Abnormal grinding or clenching of teeth. Not taking good care of your teeth. Sometimes the cause of pain isn't known. You may have pain all the time, or it may come and go. It may happen only when you're: Chewing. Exposed to hot or cold temperatures. Eating or drinking foods or drinks with a lot of sugar in them, such as soda or candy. Follow these instructions at home: Medicines Take your medicines only as told. If you were given antibiotics, take them as told. Do not stop taking them even if you start to feel better. Eating and drinking Avoid foods or drinks that cause you pain. These may include: Very hot or very cold foods or drinks. Sweet or sugary foods or drinks. Managing pain and swelling  Use ice or an ice pack on the painful area of your face as told. Put ice in a plastic bag. Place a towel between your skin and the ice. Leave the ice on for 20 minutes, 2-3 times a day. Remove the ice if your skin turns bright red. This is very important. If you cannot feel pain, heat, or cold, you have a greater risk of damage to the area. If your skin turns red, take off the ice right away to prevent skin damage. The risk of damage is higher if you can't feel pain, heat, or cold. Brushing and flossing Brush your teeth twice a day using a fluoride toothpaste. Use a toothpaste made for  sensitive teeth as told by your dental care provider. Always use a soft toothbrush. This will help prevent irritation of your gums. Floss your teeth at least once a day. General instructions Do not apply heat to the outside of your face. To cleanse your mouth and help with any irritation and swelling in the painful area, you can try rinsing with salt water. Swish and gargle with salt water and then spit it out. To make salt water, add a half to a whole spoonful of salt to a glass of warm water. Mix well. You can repeat this every 2-3 hours for pain. Contact a dental care provider if: You have dental pain and you don't know why. Your pain isn't controlled with medicines. Your symptoms get worse. You have new symptoms. Get help right away if: You can't open your mouth. You're having trouble breathing or swallowing. You have a fever. Your face, neck, or jaw is swollen. These symptoms may be an emergency. Call 911 right away. Do not wait to see if the symptoms will go away. Do not drive yourself to the hospital. This information is not intended to replace advice given to you by your health care provider. Make sure you discuss any questions you have with your health care provider. Document Revised: 02/17/2023 Document Reviewed: 09/04/2022 Elsevier Patient Education  2024 [employer].

## 2023-12-04 NOTE — Progress Notes (Signed)
 Virtual Visit Consent   Diane Stokes, you are scheduled for a virtual visit with a [facility] provider today. Just as with appointments in the office, your consent must be obtained to participate. Your consent will be active for this visit and any virtual visit you may have with one of our providers in the next 365 days. If you have a MyChart account, a copy of this consent can be sent to you electronically.  As this is a virtual visit, video technology does not allow for your provider to perform a traditional examination. This may limit your provider's ability to fully assess your condition. If your provider identifies any concerns that need to be evaluated in person or the need to arrange testing (such as labs, EKG, etc.), we will make arrangements to do so. Although advances in technology are sophisticated, we cannot ensure that it will always work on either your end or our end. If the connection with a video visit is poor, the visit may have to be switched to a telephone visit. With either a video or telephone visit, we are not always able to ensure that we have a secure connection.  By engaging in this virtual visit, you consent to the provision of healthcare and authorize for your insurance to be billed (if applicable) for the services provided during this visit. Depending on your insurance coverage, you may receive a charge related to this service.  I need to obtain your verbal consent now. Are you willing to proceed with your visit today? Diane Stokes has provided verbal consent on 12/04/2023 for a virtual visit (video or telephone). Loa Lamp, FNP  Date: 12/04/2023 5:06 PM   Virtual Visit via Video Note   I, Loa Lamp, connected with  Diane Stokes  ([id number], 1990/07/11) on 12/04/23 at  5:00 PM EDT by a video-enabled telemedicine application and verified that I am speaking with the correct person using two identifiers.  Location: Patient: Virtual Visit Location Patient:  Home Provider: Virtual Visit Location Provider: Home Office   I discussed the limitations of evaluation and management by telemedicine and the availability of in person appointments. The patient expressed understanding and agreed to proceed.    History of Present Illness: Diane Stokes is a 33 y.o. who identifies as a female who was assigned female at birth, and is being seen today for rt sinus pain with history of facial cellulitis she thought was a dental infection but developed into cellulitis.  No fever. No drainage. Pain over rt nostril with no sore noted. Previously took clindamycin  and IV atb. SABRA  HPI: HPI  Problems:  Patient Active Problem List   Diagnosis Date Noted   Palpitations 04/22/2016   Smoker 04/22/2016   SOB (shortness of breath) on exertion 04/22/2016   Class 3 obesity due to excess calories with body mass index (BMI) of 40.0 to 44.9 in adult 12/27/2013   Morbid obesity (HCC) 05/19/2013   Abnormal Pap smear of cervix 05/19/2013    Allergies:  Allergies  Allergen Reactions   Doxycycline    Klonopin [Clonazepam]    Minocycline Hives   Diflucan  [Fluconazole ] Rash   Medications:  Current Outpatient Medications:    albuterol  (VENTOLIN  HFA) 108 (90 Base) MCG/ACT inhaler, Inhale 1-2 puffs into the lungs every 6 (six) hours as needed., Disp: 8 g, Rfl: 0   Cetirizine  HCl 10 MG CAPS, Take 1 capsule (10 mg total) by mouth daily., Disp: 15 capsule, Rfl: 0   clindamycin  (CLEOCIN ) 300  MG capsule, Take 1 capsule (300 mg total) by mouth 3 (three) times daily., Disp: 21 capsule, Rfl: 0   clindamycin  (CLINDAGEL) 1 % gel, Apply topically 2 (two) times daily., Disp: 60 g, Rfl: 0   fluticasone  (FLONASE ) 50 MCG/ACT nasal spray, Place 1-2 sprays into both nostrils daily for 7 days., Disp: 1 g, Rfl: 0   HYDROcodone -acetaminophen  (NORCO/VICODIN) 5-325 MG tablet, Take one tab po q 4 hrs prn pain, Disp: 10 tablet, Rfl: 0   hydrOXYzine (VISTARIL) 25 MG capsule, TAKE 1 CAPSULE BY MOUTH EVERY  6 TO 8 HOURS AS NEEDED FOR ANXIETY AND FOR ITCHING, Disp: , Rfl:    ibuprofen  (ADVIL ) 800 MG tablet, Take 1 tablet (800 mg total) by mouth every 8 (eight) hours as needed., Disp: 21 tablet, Rfl: 0   methocarbamol  (ROBAXIN ) 500 MG tablet, Take 1 tablet (500 mg total) by mouth 3 (three) times daily., Disp: 21 tablet, Rfl: 0   metoprolol tartrate (LOPRESSOR) 25 MG tablet, Take by mouth., Disp: , Rfl:    Multiple Vitamin (MULTIVITAMIN) tablet, Take 1 tablet by mouth every morning. , Disp: , Rfl:   Current Facility-Administered Medications:    medroxyPROGESTERone  (DEPO-PROVERA ) injection 150 mg, 150 mg, Intramuscular, Q90 days, Fredirick Glenys RAMAN, MD, 150 mg at 11/01/15 1700  Observations/Objective: Patient is well-developed, well-nourished in no acute distress.  Resting comfortably  at home.  Head is normocephalic, atraumatic.  No labored breathing.  Speech is clear and coherent with logical content.  Patient is alert and oriented at baseline.    Assessment and Plan: 1. Dental infection (Primary)  2. Sinus pain  Warm salt water rinses, warm compresses, ER if sx worsen.  Follow Up Instructions: I discussed the assessment and treatment plan with the patient. The patient was provided an opportunity to ask questions and all were answered. The patient agreed with the plan and demonstrated an understanding of the instructions.  A copy of instructions were sent to the patient via MyChart unless otherwise noted below.     The patient was advised to call back or seek an in-person evaluation if the symptoms worsen or if the condition fails to improve as anticipated.    Shanel Prazak, FNP

## 2023-12-14 NOTE — Progress Notes (Unsigned)
 New patient visit  Patient: Diane Stokes   DOB: 01/03/1991   33 y.o. Female  MRN: [id number] Visit Date: 12/15/2023  Today's healthcare provider: Isaiah DELENA Pepper, MD   No chief complaint on file.  Subjective    Diane Stokes is a 33 y.o. female who presents today as a new patient to establish care.   HPI: ***    Past Medical History:  Diagnosis Date   HPV (human papilloma virus) infection    Seasonal allergies    Past Surgical History:  Procedure Laterality Date   FOOT SURGERY     Family Status  Relation Name Status   Mother  Alive   MGF  (Not Specified)  No partnership data on file   Family History  Problem Relation Age of Onset   Hyperlipidemia Mother    Hypertension Mother    Diabetes Maternal Grandfather    Hypertension Maternal Grandfather    Social History   Socioeconomic History   Marital status: Single    Spouse name: Not on file   Number of children: Not on file   Years of education: Not on file   Highest education level: 12th grade  Occupational History   Not on file  Tobacco Use   Smoking status: Every Day    Current packs/day: 0.25    Types: Cigarettes    Passive exposure: Current   Smokeless tobacco: Never  Vaping Use   Vaping status: Never Used  Substance and Sexual Activity   Alcohol use: Yes    Alcohol/week: 1.0 standard drink of alcohol    Types: 1 Cans of beer per week    Comment: occasional   Drug use: No   Sexual activity: Yes    Birth control/protection: None  Other Topics Concern   Not on file  Social History Narrative   Not on file   Social Drivers of Health   Financial Resource Strain: High Risk (12/11/2023)   Overall Financial Resource Strain (CARDIA)    Difficulty of Paying Living Expenses: Hard  Food Insecurity: Food Insecurity Present (12/11/2023)   Hunger Vital Sign    Worried About Running Out of Food in the Last Year: Sometimes true    Ran Out of Food in the Last Year: Sometimes true  Transportation  Needs: No Transportation Needs (12/11/2023)   PRAPARE - Administrator, Civil Service (Medical): No    Lack of Transportation (Non-Medical): No  Physical Activity: Inactive (12/11/2023)   Exercise Vital Sign    Days of Exercise per Week: 0 days    Minutes of Exercise per Session: Not on file  Stress: Stress Concern Present (12/11/2023)   [employer] of Occupational Health - Occupational Stress Questionnaire    Feeling of Stress: Very much  Social Connections: Socially Isolated (12/11/2023)   Social Connection and Isolation Panel    Frequency of Communication with Friends and Family: More than three times a week    Frequency of Social Gatherings with Friends and Family: Three times a week    Attends Religious Services: Never    Active Member of Clubs or Organizations: No    Attends Engineer, structural: Not on file    Marital Status: Never married   Outpatient Medications Prior to Visit  Medication Sig   albuterol  (VENTOLIN  HFA) 108 (90 Base) MCG/ACT inhaler Inhale 1-2 puffs into the lungs every 6 (six) hours as needed.   Cetirizine  HCl 10 MG CAPS Take 1 capsule (10 mg total) by  mouth daily.   clindamycin  (CLEOCIN ) 300 MG capsule Take 1 capsule (300 mg total) by mouth 3 (three) times daily.   clindamycin  (CLINDAGEL) 1 % gel Apply topically 2 (two) times daily.   fluticasone  (FLONASE ) 50 MCG/ACT nasal spray Place 1-2 sprays into both nostrils daily for 7 days.   HYDROcodone -acetaminophen  (NORCO/VICODIN) 5-325 MG tablet Take one tab po q 4 hrs prn pain   hydrOXYzine (VISTARIL) 25 MG capsule TAKE 1 CAPSULE BY MOUTH EVERY 6 TO 8 HOURS AS NEEDED FOR ANXIETY AND FOR ITCHING   ibuprofen  (ADVIL ) 800 MG tablet Take 1 tablet (800 mg total) by mouth every 8 (eight) hours as needed.   methocarbamol  (ROBAXIN ) 500 MG tablet Take 1 tablet (500 mg total) by mouth 3 (three) times daily.   metoprolol  tartrate (LOPRESSOR ) 25 MG tablet Take by mouth.   Multiple Vitamin (MULTIVITAMIN)  tablet Take 1 tablet by mouth every morning.    Facility-Administered Medications Prior to Visit  Medication Dose Route Frequency Provider   medroxyPROGESTERone  (DEPO-PROVERA ) injection 150 mg  150 mg Intramuscular Q90 days Fredirick Glenys RAMAN, MD   Allergies  Allergen Reactions   Doxycycline    Klonopin [Clonazepam]    Minocycline Hives   Diflucan  [Fluconazole ] Rash    Reviews of Systems as noted in HPI.  {Insert previous labs (optional):23779} {See past labs  Heme  Chem  Endocrine  Serology  Results Review (optional):1}   Objective    There were no vitals taken for this visit. {Insert last BP/Wt (optional):23777}{See vitals history (optional):1}   Physical Exam Constitutional:      Appearance: Normal appearance.  HENT:     Head: Normocephalic and atraumatic.     Mouth/Throat:     Mouth: Mucous membranes are moist.  Eyes:     Pupils: Pupils are equal, round, and reactive to light.  Pulmonary:     Effort: Pulmonary effort is normal.  Skin:    General: Skin is warm.  Neurological:     General: No focal deficit present.     Mental Status: She is alert.     Depression Screen     No data to display         No results found for any visits on 12/15/23.  Assessment & Plan      Problem List Items Addressed This Visit   None    No follow-ups on file.      Isaiah DELENA Pepper, MD  [facility] [phone] (phone) [phone] (fax)

## 2023-12-15 ENCOUNTER — Ambulatory Visit (INDEPENDENT_AMBULATORY_CARE_PROVIDER_SITE_OTHER)

## 2023-12-15 VITALS — BP 117/82 | HR 76 | Ht 66.0 in | Wt 278.0 lb

## 2023-12-15 DIAGNOSIS — I10 Essential (primary) hypertension: Secondary | ICD-10-CM | POA: Diagnosis not present

## 2023-12-15 DIAGNOSIS — M792 Neuralgia and neuritis, unspecified: Secondary | ICD-10-CM | POA: Insufficient documentation

## 2023-12-15 DIAGNOSIS — Z720 Tobacco use: Secondary | ICD-10-CM | POA: Diagnosis not present

## 2023-12-15 DIAGNOSIS — E038 Other specified hypothyroidism: Secondary | ICD-10-CM | POA: Diagnosis not present

## 2023-12-15 DIAGNOSIS — F339 Major depressive disorder, recurrent, unspecified: Secondary | ICD-10-CM | POA: Diagnosis not present

## 2023-12-15 MED ORDER — BUPROPION HCL ER (XL) 150 MG PO TB24: 150.0000 mg | ORAL_TABLET | Freq: Every day | ORAL | 3 refills | Status: DC

## 2023-12-15 MED ORDER — METOPROLOL TARTRATE 75 MG PO TABS: 75.0000 mg | ORAL_TABLET | Freq: Every day | ORAL | 3 refills | Status: DC

## 2023-12-15 MED ORDER — GABAPENTIN 300 MG PO CAPS: 300.0000 mg | ORAL_CAPSULE | Freq: Every day | ORAL | 3 refills | Status: DC

## 2023-12-15 NOTE — Assessment & Plan Note (Signed)
 Controlled with metoprolol  75mg . Refill provided.   BP Readings from Last 3 Encounters:  12/15/23 117/82  11/21/23 130/88  11/17/23 (!) 111/52

## 2023-12-15 NOTE — Assessment & Plan Note (Signed)
 Patient with hx of hypothyroidism, currently taking synthroid  75mcg. Last TSH checked in December. Will recheck thyroid labs today and adjust medication as needed.

## 2023-12-15 NOTE — Assessment & Plan Note (Signed)
 Counseled patient on cutting back on tobacco use for 5 minutes. She has quit successfully before when she was on wellbutrin . Will restart wellbutrin . Offered tobacco cessation counseling but patient politely declined. Will follow up in 1 month.

## 2023-12-15 NOTE — Assessment & Plan Note (Signed)
 Controlled with gabapentin . Refill provided.

## 2023-12-15 NOTE — Assessment & Plan Note (Signed)
 Uncontrolled, severe depression. Denies SI. Taking Cymbalta 60mg  daily and Atarax nightly with minimal improvement. Given concurrent ADHD and smoking hx, will add Wellbutrin  to regimen. Discussed side effects with patient. Gave resource of [email or web address] to find a therapist. Will follow up in 1 month.  Flowsheet Row Office Visit from 12/15/2023 in [facility]  PHQ-9 Total Score 23

## 2023-12-15 NOTE — Patient Instructions (Addendum)
 I recommend melatonin 2mg  nightly to help with sleep.  To find a therapist:  [email or web address]

## 2023-12-16 ENCOUNTER — Other Ambulatory Visit: Payer: Self-pay

## 2023-12-16 ENCOUNTER — Ambulatory Visit: Payer: Self-pay

## 2023-12-16 DIAGNOSIS — E038 Other specified hypothyroidism: Secondary | ICD-10-CM

## 2023-12-16 LAB — TSH+FREE T4
Free T4: 1.28 ng/dL (ref 0.82–1.77)
TSH: 8.11 u[IU]/mL — ABNORMAL HIGH (ref 0.450–4.500)

## 2023-12-16 MED ORDER — LEVOTHYROXINE SODIUM 88 MCG PO TABS: 88.0000 ug | ORAL_TABLET | Freq: Every day | ORAL | 3 refills | Status: DC

## 2023-12-16 NOTE — Telephone Encounter (Signed)
 Called pt to schedule an appt due to her mychart message of new symptoms. No answer left message to call back.

## 2023-12-16 NOTE — Telephone Encounter (Unsigned)
 Copied from CRM [id number]. Topic: Clinical - Medication Question >> Dec 16, 2023  4:00 PM Charlet HERO wrote: Reason for CRM: Patient is calling about the fluid on her ankles she is stating that she is noticing that her ankles are swelling since she stop taking them. She would like to have some called in please call the patient back to discuss.

## 2023-12-16 NOTE — Telephone Encounter (Signed)
 Please let patient know that her symptoms may be due to her low thyroid levels. If she would like to discuss more, please schedule her another appt to come see me.

## 2023-12-16 NOTE — Telephone Encounter (Signed)
 Called pt and scheduled an appt regarding ankle swelling and weight loss discussion.

## 2023-12-17 ENCOUNTER — Ambulatory Visit (INDEPENDENT_AMBULATORY_CARE_PROVIDER_SITE_OTHER)

## 2023-12-17 ENCOUNTER — Telehealth: Payer: Self-pay

## 2023-12-17 DIAGNOSIS — Z131 Encounter for screening for diabetes mellitus: Secondary | ICD-10-CM

## 2023-12-17 DIAGNOSIS — E038 Other specified hypothyroidism: Secondary | ICD-10-CM | POA: Diagnosis not present

## 2023-12-17 DIAGNOSIS — M7989 Other specified soft tissue disorders: Secondary | ICD-10-CM | POA: Diagnosis not present

## 2023-12-17 MED ORDER — SEMAGLUTIDE-WEIGHT MANAGEMENT 0.25 MG/0.5ML ~~LOC~~ SOAJ: 0.2500 mg | SUBCUTANEOUS | 0 refills | Status: AC

## 2023-12-17 MED ORDER — SEMAGLUTIDE-WEIGHT MANAGEMENT 0.5 MG/0.5ML ~~LOC~~ SOAJ: 0.5000 mg | SUBCUTANEOUS | 0 refills | Status: DC

## 2023-12-17 NOTE — Telephone Encounter (Signed)
 Copied from CRM #[id number]. Topic: Clinical - Prescription Issue >> Dec 17, 2023  3:54 PM Amy B wrote: Reason for CRM: Wegovy  is too expensive over $1000 and patient would like to know if there is a cheaper alternative.  She requests a call back to discuss.

## 2023-12-17 NOTE — Assessment & Plan Note (Addendum)
 Patient with 1 year hx of ankle swelling, now worsening. On exam today, patient with trace BLE edema. Per outside records, was supposed to get echo done previously but never done. DDx is broad and includes uncontrolled hypothyroidism (just adjusted her dose), dependent edema, venous insufficiency, new CHF, renal dysfunction, liver dysfunction.  - Will check lab work as below - Discussed compression socks, elevation of legs - Continue levothyroxine  - Consider echo

## 2023-12-17 NOTE — Assessment & Plan Note (Signed)
 Chronic, uncontrolled based on recent lab work. Increased synthroid  to which patient just started. Will recheck TSH in 6-8 weeks.  Lab Results  Component Value Date   TSH 8.110 (H) 12/15/2023

## 2023-12-17 NOTE — Progress Notes (Signed)
 Established patient visit  Patient: Diane Stokes   DOB: [birth year]   33 y.o. Female  MRN: [id number] Visit Date: 12/17/2023  Today's healthcare provider: Isaiah DELENA Pepper, MD   Chief Complaint  Patient presents with   Joint Swelling    1 year of bilateral ankle swelling Decline vaccines   Weight Management Screening    Has tried to eat healthy of grilled, bakes with vegetables, walking exercise 4-5 days a week about 60 minutes for 1 year has been hard since has thyroid problems.   Subjective     HPI:  Weight: - has gained a lot of weight in the past year - has tried to eat healthy, doesn't have much appetite - walks 4-5 days/week - no hx of T2DM  Leg Swelling: - Reports ankle swelling that is worse at end of the day - No hx of CHF, kidney disease - does have family hx of CHF, kidney disease - takes OTC diuretic occasionally which is partially effective   Medications: Outpatient Medications Prior to Visit  Medication Sig   albuterol  (VENTOLIN  HFA) 108 (90 Base) MCG/ACT inhaler Inhale 1-2 puffs into the lungs every 6 (six) hours as needed.   aspirin EC 81 MG tablet Take 81 mg by mouth daily. Swallow whole.   buPROPion  (WELLBUTRIN  XL) 150 MG 24 hr tablet Take 1 tablet (150 mg total) by mouth daily.   Cetirizine  HCl 10 MG CAPS Take 1 capsule (10 mg total) by mouth daily.   clindamycin  (CLINDAGEL) 1 % gel Apply topically 2 (two) times daily.   DULoxetine (CYMBALTA) 60 MG capsule Take 60 mg by mouth daily.   Ferrous Gluconate (IRON 27 PO) Take 27 mg by mouth daily at 2 PM.   fluticasone  (FLONASE ) 50 MCG/ACT nasal spray Place 1-2 sprays into both nostrils daily for 7 days.   gabapentin  (NEURONTIN ) 300 MG capsule Take 1 capsule (300 mg total) by mouth at bedtime.   hydrOXYzine (VISTARIL) 25 MG capsule TAKE 1 CAPSULE BY MOUTH EVERY 6 TO 8 HOURS AS NEEDED FOR ANXIETY AND FOR ITCHING   ibuprofen  (ADVIL ) 800 MG tablet Take 1 tablet (800 mg total) by mouth every 8 (eight) hours  as needed.   levothyroxine  (SYNTHROID ) 88 MCG tablet Take 1 tablet (88 mcg total) by mouth daily before breakfast.   methocarbamol  (ROBAXIN ) 500 MG tablet Take 1 tablet (500 mg total) by mouth 3 (three) times daily.   metoprolol  tartrate 75 MG TABS Take 1 tablet (75 mg total) by mouth daily.   Multiple Vitamin (MULTIVITAMIN) tablet Take 1 tablet by mouth every morning.    pantoprazole (PROTONIX) 20 MG tablet Take 20 mg by mouth 2 (two) times daily.   Facility-Administered Medications Prior to Visit  Medication Dose Route Frequency Provider   medroxyPROGESTERone  (DEPO-PROVERA ) injection 150 mg  150 mg Intramuscular Q90 days Fredirick Glenys RAMAN, MD    Review of Systems as noted in HPI.      Objective    BP 121/80 (BP Location: Left Arm, Patient Position: Sitting, Cuff Size: Large)   Pulse 82   Resp 16   Ht 5' 10.5 (1.791 m)   Wt 276 lb 6.4 oz (125.4 kg)   SpO2 99%   BMI 39.10 kg/m    Physical Exam Constitutional:      Appearance: Normal appearance.  HENT:     Head: Normocephalic and atraumatic.     Mouth/Throat:     Mouth: Mucous membranes are moist.  Eyes:  Pupils: Pupils are equal, round, and reactive to light.  Cardiovascular:     Rate and Rhythm: Normal rate and regular rhythm.     Heart sounds: Normal heart sounds.  Pulmonary:     Effort: Pulmonary effort is normal.     Breath sounds: Normal breath sounds.  Musculoskeletal:     Comments: Trace edema in bilateral ankles  Skin:    General: Skin is warm.  Neurological:     General: No focal deficit present.     Mental Status: She is alert.      No results found for any visits on 12/17/23.  Assessment & Plan     Problem List Items Addressed This Visit       Endocrine   Other specified hypothyroidism   Chronic, uncontrolled based on recent lab work. Increased synthroid  to 88mcg which patient just started. Will recheck TSH in 6-8 weeks.  Lab Results  Component Value Date   TSH 8.110 (H) 12/15/2023            Other   Morbid obesity (HCC) - Primary   Chronic, uncontrolled. BMI 39, currently weighs 276lb. Has gained significant amount of weight in past year despite eating a balanced diet and exercising 4-5 days/week. Likely partially due to uncontrolled hypothyroidism, however has struggled with weight for years. Would be a great candidate for GLP-1 for weight loss. - Discussed medication options with patient, interested in Wegovy  - Will start Wegovy , side effects discussed with patient. PA process discussed.      Relevant Medications   semaglutide -weight management (WEGOVY ) 0.25 MG/0.5ML SOAJ SQ injection   semaglutide -weight management (WEGOVY ) 0.5 MG/0.5ML SOAJ SQ injection (Start on 01/15/2024)   Leg swelling   Patient with 1 year hx of ankle swelling, now worsening. On exam today, patient with trace BLE edema. Per outside records, was supposed to get echo done previously but never done. DDx is broad and includes uncontrolled hypothyroidism (just adjusted her dose), dependent edema, venous insufficiency, new CHF, renal dysfunction, liver dysfunction.  - Will check lab work as below - Discussed compression socks, elevation of legs - Continue levothyroxine  - Consider echo      Relevant Orders   Comprehensive metabolic panel with GFR   Brain natriuretic peptide   Other Visit Diagnoses       Screening for diabetes mellitus (DM)       Relevant Orders   Hemoglobin A1c       Isaiah DELENA Pepper, MD  [facility] [phone] (phone) [phone] (fax)

## 2023-12-17 NOTE — Assessment & Plan Note (Addendum)
 Chronic, uncontrolled. BMI 39, currently weighs 276lb. Has gained significant amount of weight in past year despite eating a balanced diet and exercising 4-5 days/week. Likely partially due to uncontrolled hypothyroidism, however has struggled with weight for years. Would be a great candidate for GLP-1 for weight loss. - Discussed medication options with patient, interested in Wegovy  - Will start Wegovy , side effects discussed with patient. PA process discussed.

## 2023-12-17 NOTE — Telephone Encounter (Signed)
 Medicaid should cover it, it just needs to go through the PA process. Can we check on the PA? Also, can you let her know that if Medicaid doesn't cover it, she should be able to get it through her new insurance (she is starting a [facility] next week).

## 2023-12-18 ENCOUNTER — Other Ambulatory Visit (HOSPITAL_COMMUNITY): Payer: Self-pay

## 2023-12-18 ENCOUNTER — Telehealth: Payer: Self-pay

## 2023-12-18 LAB — COMPREHENSIVE METABOLIC PANEL WITH GFR
ALT: 25 IU/L (ref 0–32)
AST: 21 IU/L (ref 0–40)
Albumin: 4.2 g/dL (ref 3.9–4.9)
Alkaline Phosphatase: 91 IU/L (ref 44–121)
BUN/Creatinine Ratio: 9 (ref 9–23)
BUN: 8 mg/dL (ref 6–20)
Bilirubin Total: 0.4 mg/dL (ref 0.0–1.2)
CO2: 23 mmol/L (ref 20–29)
Calcium: 9.8 mg/dL (ref 8.7–10.2)
Chloride: 101 mmol/L (ref 96–106)
Creatinine, Ser: 0.93 mg/dL (ref 0.57–1.00)
Globulin, Total: 2.8 g/dL (ref 1.5–4.5)
Glucose: 82 mg/dL (ref 70–99)
Potassium: 5.4 mmol/L — ABNORMAL HIGH (ref 3.5–5.2)
Sodium: 139 mmol/L (ref 134–144)
Total Protein: 7 g/dL (ref 6.0–8.5)
eGFR: 82 mL/min/1.73 (ref >59)

## 2023-12-18 LAB — HEMOGLOBIN A1C
Est. average glucose Bld gHb Est-mCnc: 97 mg/dL
Hgb A1c MFr Bld: 5 % (ref 4.8–5.6)

## 2023-12-18 LAB — BRAIN NATRIURETIC PEPTIDE: BNP: 19 pg/mL (ref 0.0–100.0)

## 2023-12-18 NOTE — Telephone Encounter (Signed)
 Pharmacy Patient Advocate Encounter   Received notification from Pt Calls Messages that prior authorization for Wegovy  0.25MG /0.5ML auto-injectors  is required/requested.   Insurance verification completed.   The patient is insured through [employer] .   Per test claim: PA required; PA submitted to above mentioned insurance via Latent Key/confirmation #/EOC [id number] Status is pending

## 2023-12-22 ENCOUNTER — Ambulatory Visit: Payer: Self-pay

## 2023-12-22 NOTE — Telephone Encounter (Signed)
 Pharmacy Patient Advocate Encounter  Received notification from [employer] that Prior Authorization for Wegovy  0.25mg /0.56ml has been APPROVED from 12/18/23 to 06/15/24   PA #/Case ID/Reference #: [id number]  Approval letter indexed to media tab

## 2023-12-24 ENCOUNTER — Ambulatory Visit

## 2023-12-28 ENCOUNTER — Ambulatory Visit

## 2023-12-30 DIAGNOSIS — M25571 Pain in right ankle and joints of right foot: Secondary | ICD-10-CM | POA: Diagnosis not present

## 2023-12-30 DIAGNOSIS — S8391XA Sprain of unspecified site of right knee, initial encounter: Secondary | ICD-10-CM | POA: Diagnosis not present

## 2023-12-30 DIAGNOSIS — S9031XA Contusion of right foot, initial encounter: Secondary | ICD-10-CM | POA: Diagnosis not present

## 2023-12-30 DIAGNOSIS — M79671 Pain in right foot: Secondary | ICD-10-CM | POA: Diagnosis not present

## 2023-12-30 DIAGNOSIS — S93401A Sprain of unspecified ligament of right ankle, initial encounter: Secondary | ICD-10-CM | POA: Diagnosis not present

## 2023-12-30 DIAGNOSIS — M25561 Pain in right knee: Secondary | ICD-10-CM | POA: Diagnosis not present

## 2024-01-02 ENCOUNTER — Telehealth: Admitting: Nurse Practitioner

## 2024-01-02 DIAGNOSIS — F339 Major depressive disorder, recurrent, unspecified: Secondary | ICD-10-CM

## 2024-01-02 MED ORDER — DULOXETINE HCL 60 MG PO CPEP: 60.0000 mg | ORAL_CAPSULE | Freq: Every day | ORAL | 0 refills | Status: DC

## 2024-01-02 NOTE — Progress Notes (Signed)
 Virtual Visit Consent   POLA FURNO, you are scheduled for a virtual visit with a [facility] provider today. Just as with appointments in the office, your consent must be obtained to participate. Your consent will be active for this visit and any virtual visit you may have with one of our providers in the next 365 days. If you have a MyChart account, a copy of this consent can be sent to you electronically.  As this is a virtual visit, video technology does not allow for your provider to perform a traditional examination. This may limit your provider's ability to fully assess your condition. If your provider identifies any concerns that need to be evaluated in person or the need to arrange testing (such as labs, EKG, etc.), we will make arrangements to do so. Although advances in technology are sophisticated, we cannot ensure that it will always work on either your end or our end. If the connection with a video visit is poor, the visit may have to be switched to a telephone visit. With either a video or telephone visit, we are not always able to ensure that we have a secure connection.  By engaging in this virtual visit, you consent to the provision of healthcare and authorize for your insurance to be billed (if applicable) for the services provided during this visit. Depending on your insurance coverage, you may receive a charge related to this service.  I need to obtain your verbal consent now. Are you willing to proceed with your visit today? Diane Stokes has provided verbal consent on 01/02/2024 for a virtual visit (video or telephone). Diane Diane Servant, NP  Date: 01/02/2024 8:48 AM   Virtual Visit via Video Note   I, Diane Stokes, connected with  Diane Stokes  ([id number], Aug 31, 1990) on 01/02/24 at  8:45 AM EDT by a video-enabled telemedicine application and verified that I am speaking with the correct person using two identifiers.  Location: Patient: Virtual Visit Location Patient:  Home Provider: Virtual Visit Location Provider: Home Office   I discussed the limitations of evaluation and management by telemedicine and the availability of in person appointments. The patient expressed understanding and agreed to proceed.    History of Present Illness: Diane Stokes is a 33 y.o. who identifies as a female who was assigned female at birth, and is being seen today for medication refill.  Diane Stokes has a history of depression and currently taking gabapentin  and cymbalta . She is requesting a refill of cymbalta  today as she has run out.    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   Tobacco use 12/15/2023   Palpitations 04/22/2016   Smoker 04/22/2016   SOB (shortness of breath) on exertion 04/22/2016   Class 3 obesity due to excess calories with body mass index (BMI) of 40.0 to 44.9 in adult 12/27/2013   Morbid obesity (HCC) 05/19/2013   Abnormal Pap smear of cervix 05/19/2013    Allergies:  Allergies  Allergen Reactions   Doxycycline    Klonopin [Clonazepam]    Minocycline Hives   Diflucan  [Fluconazole ] Rash   Medications:  Current Outpatient Medications:    albuterol  (VENTOLIN  HFA) 108 (90 Base) MCG/ACT inhaler, Inhale 1-2 puffs into the lungs every 6 (six) hours as needed., Disp: 8 g, Rfl: 0  aspirin EC 81 MG tablet, Take 81 mg by mouth daily. Swallow whole., Disp: , Rfl:    buPROPion  (WELLBUTRIN  XL) 150 MG 24 hr tablet, Take 1 tablet (150 mg total) by mouth daily., Disp: 30 tablet, Rfl: 3   Cetirizine  HCl 10 MG CAPS, Take 1 capsule (10 mg total) by mouth daily., Disp: 15 capsule, Rfl: 0   clindamycin  (CLINDAGEL) 1 % gel, Apply topically 2 (two) times daily., Disp: 60 g, Rfl: 0   DULoxetine  (CYMBALTA ) 60 MG capsule, Take 1 capsule (60 mg total) by mouth daily.,  Disp: 30 capsule, Rfl: 0   Ferrous Gluconate (IRON 27 PO), Take 27 mg by mouth daily at 2 PM., Disp: , Rfl:    fluticasone  (FLONASE ) 50 MCG/ACT nasal spray, Place 1-2 sprays into both nostrils daily for 7 days., Disp: 1 g, Rfl: 0   gabapentin  (NEURONTIN ) 300 MG capsule, Take 1 capsule (300 mg total) by mouth at bedtime., Disp: 30 capsule, Rfl: 3   hydrOXYzine (VISTARIL) 25 MG capsule, TAKE 1 CAPSULE BY MOUTH EVERY 6 TO 8 HOURS AS NEEDED FOR ANXIETY AND FOR ITCHING, Disp: , Rfl:    ibuprofen  (ADVIL ) 800 MG tablet, Take 1 tablet (800 mg total) by mouth every 8 (eight) hours as needed., Disp: 21 tablet, Rfl: 0   levothyroxine  (SYNTHROID ) 88 MCG tablet, Take 1 tablet (88 mcg total) by mouth daily before breakfast., Disp: 30 tablet, Rfl: 3   methocarbamol  (ROBAXIN ) 500 MG tablet, Take 1 tablet (500 mg total) by mouth 3 (three) times daily., Disp: 21 tablet, Rfl: 0   metoprolol  tartrate 75 MG TABS, Take 1 tablet (75 mg total) by mouth daily., Disp: 30 tablet, Rfl: 3   Multiple Vitamin (MULTIVITAMIN) tablet, Take 1 tablet by mouth every morning. , Disp: , Rfl:    pantoprazole (PROTONIX) 20 MG tablet, Take 20 mg by mouth 2 (two) times daily., Disp: , Rfl:    semaglutide -weight management (WEGOVY ) 0.25 MG/0.5ML SOAJ SQ injection, Inject 0.25 mg into the skin once a week for 28 days., Disp: 2 mL, Rfl: 0   [START ON 01/15/2024] semaglutide -weight management (WEGOVY ) 0.5 MG/0.5ML SOAJ SQ injection, Inject 0.5 mg into the skin once a week for 28 days. Take after 0.25mg  dose., Disp: 2 mL, Rfl: 0  Current Facility-Administered Medications:    medroxyPROGESTERone  (DEPO-PROVERA ) injection 150 mg, 150 mg, Intramuscular, Q90 days, Fredirick Glenys RAMAN, MD, 150 mg at 11/01/15 1700  Observations/Objective: Patient is well-developed, well-nourished in no acute distress.  Resting comfortably at home.  Head is normocephalic, atraumatic.  No labored breathing.  Speech is clear and coherent with logical content.  Patient is  alert and oriented at baseline.    Assessment and Plan: 1. Depression, recurrent (HCC) (Primary) - DULoxetine  (CYMBALTA ) 60 MG capsule; Take 1 capsule (60 mg total) by mouth daily.  Dispense: 30 capsule; Refill: 0   Follow Up Instructions: I discussed the assessment and treatment plan with the patient. The patient was provided an opportunity to ask questions and all were answered. The patient agreed with the plan and demonstrated an understanding of the instructions.  A copy of instructions were sent to the patient via MyChart unless otherwise noted below.    The patient was advised to call back or seek an in-person evaluation if the symptoms worsen or if the condition fails to improve as anticipated.    Karilyn Wind W Hermela Hardt, NP

## 2024-01-02 NOTE — Patient Instructions (Signed)
 Diane Stokes, thank you for joining Haze LELON Servant, NP for today's virtual visit.  While this provider is not your primary care provider (PCP), if your PCP is located in our provider database this encounter information will be shared with them immediately following your visit.   A [facility] MyChart account gives you access to today's visit and all your visits, tests, and labs performed at [facility]  click here if you don't have a [facility] MyChart account or go to [email or web address]  Consent: (Patient) Diane Stokes provided verbal consent for this virtual visit at the beginning of the encounter.  Current Medications:  Current Outpatient Medications:    albuterol  (VENTOLIN  HFA) 108 (90 Base) MCG/ACT inhaler, Inhale 1-2 puffs into the lungs every 6 (six) hours as needed., Disp: 8 g, Rfl: 0   aspirin EC 81 MG tablet, Take 81 mg by mouth daily. Swallow whole., Disp: , Rfl:    buPROPion  (WELLBUTRIN  XL) 150 MG 24 hr tablet, Take 1 tablet (150 mg total) by mouth daily., Disp: 30 tablet, Rfl: 3   Cetirizine  HCl 10 MG CAPS, Take 1 capsule (10 mg total) by mouth daily., Disp: 15 capsule, Rfl: 0   clindamycin  (CLINDAGEL) 1 % gel, Apply topically 2 (two) times daily., Disp: 60 g, Rfl: 0   DULoxetine  (CYMBALTA ) 60 MG capsule, Take 1 capsule (60 mg total) by mouth daily., Disp: 30 capsule, Rfl: 0   Ferrous Gluconate (IRON 27 PO), Take 27 mg by mouth daily at 2 PM., Disp: , Rfl:    fluticasone  (FLONASE ) 50 MCG/ACT nasal spray, Place 1-2 sprays into both nostrils daily for 7 days., Disp: 1 g, Rfl: 0   gabapentin  (NEURONTIN ) 300 MG capsule, Take 1 capsule (300 mg total) by mouth at bedtime., Disp: 30 capsule, Rfl: 3   hydrOXYzine (VISTARIL) 25 MG capsule, TAKE 1 CAPSULE BY MOUTH EVERY 6 TO 8 HOURS AS NEEDED FOR ANXIETY AND FOR ITCHING, Disp: , Rfl:    ibuprofen  (ADVIL ) 800 MG tablet, Take 1 tablet (800 mg total) by mouth every 8 (eight) hours as needed., Disp: 21 tablet, Rfl: 0    levothyroxine  (SYNTHROID ) 88 MCG tablet, Take 1 tablet (88 mcg total) by mouth daily before breakfast., Disp: 30 tablet, Rfl: 3   methocarbamol  (ROBAXIN ) 500 MG tablet, Take 1 tablet (500 mg total) by mouth 3 (three) times daily., Disp: 21 tablet, Rfl: 0   metoprolol  tartrate 75 MG TABS, Take 1 tablet (75 mg total) by mouth daily., Disp: 30 tablet, Rfl: 3   Multiple Vitamin (MULTIVITAMIN) tablet, Take 1 tablet by mouth every morning. , Disp: , Rfl:    pantoprazole (PROTONIX) 20 MG tablet, Take 20 mg by mouth 2 (two) times daily., Disp: , Rfl:    semaglutide -weight management (WEGOVY ) 0.25 MG/0.5ML SOAJ SQ injection, Inject 0.25 mg into the skin once a week for 28 days., Disp: 2 mL, Rfl: 0   [START ON 01/15/2024] semaglutide -weight management (WEGOVY ) 0.5 MG/0.5ML SOAJ SQ injection, Inject 0.5 mg into the skin once a week for 28 days. Take after 0.25mg  dose., Disp: 2 mL, Rfl: 0  Current Facility-Administered Medications:    medroxyPROGESTERone  (DEPO-PROVERA ) injection 150 mg, 150 mg, Intramuscular, Q90 days, Fredirick Glenys RAMAN, MD, 150 mg at 11/01/15 1700   Medications ordered in this encounter:  Meds ordered this encounter  Medications   DULoxetine  (CYMBALTA ) 60 MG capsule    Sig: Take 1 capsule (60 mg total) by mouth daily.    Dispense:  30 capsule  Refill:  0    Supervising Provider:   BLAISE ALEENE KIDD [[id number]]     *If you need refills on other medications prior to your next appointment, please contact your pharmacy*  Follow-Up: Call back or seek an in-person evaluation if the symptoms worsen or if the condition fails to improve as anticipated.  [facility] [phone]    If you have been instructed to have an in-person evaluation today at a local Urgent Care facility, please use the link below. It will take you to a list of all of our available [facility], including address, phone number and hours of operation. Please do not delay care.  [facility]  If you or a family member do not have a primary care provider, use the link below to schedule a visit and establish care. When you choose a [facility] primary care physician or advanced practice provider, you gain a long-term partner in health. Find a Primary Care Provider  Learn more about [facility]'s in-office and virtual care options: [facility] - Get Care Now

## 2024-01-04 ENCOUNTER — Other Ambulatory Visit: Payer: Self-pay

## 2024-01-04 NOTE — Telephone Encounter (Signed)
 Done, already filled by another provider on 9/13.

## 2024-01-12 ENCOUNTER — Ambulatory Visit

## 2024-01-26 ENCOUNTER — Other Ambulatory Visit: Payer: Self-pay

## 2024-01-26 MED ORDER — SEMAGLUTIDE-WEIGHT MANAGEMENT 0.5 MG/0.5ML ~~LOC~~ SOAJ: 0.5000 mg | SUBCUTANEOUS | 0 refills | Status: DC

## 2024-01-27 ENCOUNTER — Ambulatory Visit (INDEPENDENT_AMBULATORY_CARE_PROVIDER_SITE_OTHER)

## 2024-01-27 VITALS — BP 113/74 | HR 82 | Resp 16 | Ht 62.0 in | Wt 273.0 lb

## 2024-01-27 DIAGNOSIS — J019 Acute sinusitis, unspecified: Secondary | ICD-10-CM | POA: Insufficient documentation

## 2024-01-27 DIAGNOSIS — Z3046 Encounter for surveillance of implantable subdermal contraceptive: Secondary | ICD-10-CM | POA: Diagnosis not present

## 2024-01-27 DIAGNOSIS — Z23 Encounter for immunization: Secondary | ICD-10-CM

## 2024-01-27 LAB — POCT URINE PREGNANCY: Preg Test, Ur: NEGATIVE

## 2024-01-27 MED ORDER — AMOXICILLIN-POT CLAVULANATE 875-125 MG PO TABS: 1.0000 | ORAL_TABLET | Freq: Two times a day (BID) | ORAL | 0 refills | Status: AC

## 2024-01-27 MED ORDER — LIDOCAINE-EPINEPHRINE (PF) 1 %-1:200000 IJ SOLN
2.0000 mL | Freq: Once | INTRAMUSCULAR | Status: AC
  Administered 2024-01-27: 2 mL via INTRADERMAL

## 2024-01-27 MED ORDER — ETONOGESTREL 68 MG ~~LOC~~ IMPL
68.0000 mg | DRUG_IMPLANT | Freq: Once | SUBCUTANEOUS | Status: AC
  Administered 2024-01-27: 68 mg via SUBCUTANEOUS

## 2024-01-27 NOTE — Patient Instructions (Addendum)
 Nexplanon  Aftercare Instructions    **Care for your arm after Nexplanon  insertion:**      - Keep the pressure bandage on your arm for 24 hours to help reduce bruising. After 24 hours, you may remove the pressure bandage. Leave the small adhesive wound closure in place for 3 to 5 days, or until it falls off on its own.   - Avoid heavy lifting, strenuous activity, or contact sports with the affected arm for a few days to prevent injury or movement of the implant.      **What to expect:**      - It is normal to have some bruising, swelling, or tenderness at the insertion site for a few days. Mild redness or itching may also occur.     - You may notice changes in your menstrual bleeding. This can include lighter, heavier, more frequent, less frequent, or even no periods at all. These changes are common and not harmful, but if you have very heavy or prolonged bleeding, contact your healthcare provider.    - Other possible side effects include headache, breast pain, weight changes, or acne. Most side effects are mild and improve over time.    **When to call your healthcare provider:**      - If you cannot feel the implant under your skin, or if you notice that it seems to have moved, contact your provider right away.    - If you develop signs of infection at the insertion site, such as increasing redness, warmth, swelling, pus, or fever, seek medical attention.     - If you experience chest pain, trouble breathing, or coughing up blood, seek emergency care immediately, as these may be signs of a rare complication.    - If you become pregnant or have severe abdominal pain, contact your provider to rule out ectopic pregnancy.    **General information:**      - Nexplanon  does not protect against HIV or other sexually transmitted infections. Use condoms for protection against STIs.     - The implant is approved for up to 5 years. Schedule removal or replacement before the end of this period if  you wish to continue contraception    - Attend yearly check-ups for blood pressure and general health monitoring while using Nexplanon .    If you have any questions or concerns, contact your healthcare provider.

## 2024-01-27 NOTE — Assessment & Plan Note (Signed)
 Patient with 1 week hx of congestion, sinus pressure, and headache concerning for acute sinusitis. Discussed with patient that most likely etiology is viral, however if symptoms do not improve within a few days, reasonable to start a course of antibiotics. - Will send Augmentin  x7 days to patient's pharmacy - Return precautions given

## 2024-01-27 NOTE — Assessment & Plan Note (Signed)
 See procedure note. After care instructions given to patient.

## 2024-01-27 NOTE — Progress Notes (Signed)
    Acute visit  Patient: Diane Stokes   DOB: April 20, 1991   33 y.o. Female  MRN: [id number] PCP: Franchot Isaiah LABOR, MD   Chief Complaint  Patient presents with   Procedure    Nexplanon  removal and replacement    Subjective     Sinus infection: - reports congestion, sinus pressure for the past week - has tried mucinex  without relief - gets sinus infections regularly  Review of systems as noted in HPI.   Objective    BP 113/74 (BP Location: Left Arm, Patient Position: Sitting, Cuff Size: Large)   Pulse 82   Resp 16   Ht 5' 2 (1.575 m)   Wt 273 lb (123.8 kg)   SpO2 99%   BMI 49.93 kg/m  Physical Exam Constitutional:      Appearance: Normal appearance.  HENT:     Head: Normocephalic and atraumatic.     Nose: Congestion present.     Right Sinus: Maxillary sinus tenderness present.     Left Sinus: Maxillary sinus tenderness present.     Mouth/Throat:     Mouth: Mucous membranes are moist.  Eyes:     Pupils: Pupils are equal, round, and reactive to light.  Pulmonary:     Effort: Pulmonary effort is normal.  Skin:    General: Skin is warm.  Neurological:     General: No focal deficit present.     Mental Status: She is alert.       Results for orders placed or performed in visit on 01/27/24  POCT urine pregnancy  Result Value Ref Range   Preg Test, Ur Negative Negative    Assessment & Plan     Problem List Items Addressed This Visit       Respiratory   Acute non-recurrent sinusitis - Primary   Patient with 1 week hx of congestion, sinus pressure, and headache concerning for acute sinusitis. Discussed with patient that most likely etiology is viral, however if symptoms do not improve within a few days, reasonable to start a course of antibiotics. - Will send Augmentin  x7 days to patient's pharmacy - Return precautions given      Relevant Medications   amoxicillin -clavulanate (AUGMENTIN ) 875-125 MG tablet     Other   Encounter for removal and  reinsertion of Nexplanon    See procedure note. After care instructions given to patient.      Relevant Orders   POCT urine pregnancy (Completed)   Other Visit Diagnoses       Need for vaccination       Relevant Orders   Flu vaccine trivalent PF, 6mos and older(Flulaval,Afluria,Fluarix,Fluzone) (Completed)        Meds ordered this encounter  Medications   amoxicillin -clavulanate (AUGMENTIN ) 875-125 MG tablet    Sig: Take 1 tablet by mouth 2 (two) times daily for 7 days.    Dispense:  14 tablet    Refill:  0   lidocaine-EPINEPHrine (PF) (XYLOCAINE-EPINEPHrine) 1 %-1:200000 (PF) injection 2 mL   etonogestrel  (NEXPLANON ) implant 68 mg     Return in about 4 weeks (around 02/24/2024) for Follow Up.      Isaiah LABOR Franchot, MD  [facility] [phone] (phone) [phone] (fax)

## 2024-01-27 NOTE — Progress Notes (Signed)
 PROCEDURE NOTE: NEXPLANON  REMOVAL and REINSERTION  Nexplanon  inserted: July 2020  REMOVAL Patient given informed consent and signed copy in the chart for both procedures. An appropriate time out was been taken Pregnancy test: negative Left  arm area prepped and draped in the usual sterile fashion. Two cc of 1% lidocaine with epinephrine used for local anesthesia. A small stab incision was made close to the nexplanon  with scalpel. Hemostats were used to withdraw the Nexplanon . Ensured that entire nexplanon  device removed.  INSERTION of new NEXPLANON   Prior to the removal procedure, an appropriate time out had been taken and there were no breaks in patient care between the procedures. Sterile field had been maintained as well as local anesthesia.   Nexplanon  was inserted in typical fashion in the  left ARM (same site previously used and where removal was performed). There were no complications.  Pressure bandage was  applied to decrease bruising. Patient given follow up instructions should she experience redness, swelling at sight or fever in the next 24 hours.

## 2024-02-03 ENCOUNTER — Ambulatory Visit

## 2024-02-05 DIAGNOSIS — S93401A Sprain of unspecified ligament of right ankle, initial encounter: Secondary | ICD-10-CM | POA: Diagnosis not present

## 2024-02-10 ENCOUNTER — Other Ambulatory Visit: Payer: Self-pay

## 2024-02-10 DIAGNOSIS — F339 Major depressive disorder, recurrent, unspecified: Secondary | ICD-10-CM

## 2024-02-10 NOTE — Telephone Encounter (Unsigned)
 Copied from CRM [id number]. Topic: Clinical - Medication Refill >> Feb 10, 2024  5:48 PM Donee H wrote: Medication: DULoxetine  (CYMBALTA ) 60 MG capsule  Has the patient contacted their pharmacy? No  This is the patient's preferred pharmacy:   [facility] [address], [place] - [address] [place] [address] Phone: [phone] Fax: [phone]  Is this the correct pharmacy for this prescription? Yes If no, delete pharmacy and type the correct one.   Has the prescription been filled recently? No  Is the patient out of the medication? No,but patient says only has one left  Has the patient been seen for an appointment in the last year OR does the patient have an upcoming appointment? Yes  Can we respond through MyChart? Yes  Agent: Please be advised that Rx refills may take up to 3 business days. We ask that you follow-up with your pharmacy.

## 2024-02-12 MED ORDER — DULOXETINE HCL 60 MG PO CPEP: 60.0000 mg | ORAL_CAPSULE | Freq: Every day | ORAL | 0 refills | Status: DC

## 2024-02-12 NOTE — Telephone Encounter (Signed)
 Requested Prescriptions  Pending Prescriptions Disp Refills   DULoxetine  (CYMBALTA ) 60 MG capsule 90 capsule 0    Sig: Take 1 capsule (60 mg total) by mouth daily.     Psychiatry: Antidepressants - SNRI - duloxetine  Passed - 02/12/2024 12:44 PM      Passed - Cr in normal range and within 360 days    Creatinine  Date Value Ref Range Status  11/05/2011 0.89 0.60 - 1.30 mg/dL Final   Creatinine, Ser  Date Value Ref Range Status  12/17/2023 0.93 0.57 - 1.00 mg/dL Final         Passed - eGFR is 30 or above and within 360 days    EGFR (African American)  Date Value Ref Range Status  11/05/2011 >60  Final   GFR calc Af Amer  Date Value Ref Range Status  05/05/2015 >60 >60 mL/min Final    Comment:    (NOTE) The eGFR has been calculated using the CKD EPI equation. This calculation has not been validated in all clinical situations. eGFR's persistently <60 mL/min signify possible Chronic Kidney Disease.    EGFR (Non-African Amer.)  Date Value Ref Range Status  11/05/2011 >60  Final    Comment:    eGFR values <1mL/min/1.73 m2 may be an indication of chronic kidney disease (CKD). Calculated eGFR is useful in patients with stable renal function. The eGFR calculation will not be reliable in acutely ill patients when serum creatinine is changing rapidly. It is not useful in  patients on dialysis. The eGFR calculation may not be applicable to patients at the low and high extremes of body sizes, pregnant women, and vegetarians.    GFR calc non Af Amer  Date Value Ref Range Status  05/05/2015 >60 >60 mL/min Final   eGFR  Date Value Ref Range Status  12/17/2023 82 >59 mL/min/1.73 Final         Passed - Completed PHQ-2 or PHQ-9 in the last 360 days      Passed - Last BP in normal range    BP Readings from Last 1 Encounters:  01/27/24 113/74         Passed - Valid encounter within last 6 months    Recent Outpatient Visits           2 weeks ago Acute non-recurrent  sinusitis, unspecified location   [facility] Franchot Isaiah LABOR, MD   1 month ago Morbid obesity [facility])   [facility] Franchot Isaiah LABOR, MD   1 month ago Depression, recurrent   [facility] Franchot Isaiah LABOR, MD

## 2024-02-15 ENCOUNTER — Telehealth: Admitting: Physician Assistant

## 2024-02-15 DIAGNOSIS — J019 Acute sinusitis, unspecified: Secondary | ICD-10-CM | POA: Diagnosis not present

## 2024-02-15 DIAGNOSIS — B9689 Other specified bacterial agents as the cause of diseases classified elsewhere: Secondary | ICD-10-CM

## 2024-02-15 DIAGNOSIS — R051 Acute cough: Secondary | ICD-10-CM | POA: Diagnosis not present

## 2024-02-15 MED ORDER — PSEUDOEPH-BROMPHEN-DM 30-2-10 MG/5ML PO SYRP: 5.0000 mL | ORAL_SOLUTION | Freq: Four times a day (QID) | ORAL | 0 refills | Status: DC | PRN

## 2024-02-15 MED ORDER — AMOXICILLIN-POT CLAVULANATE 875-125 MG PO TABS: 1.0000 | ORAL_TABLET | Freq: Two times a day (BID) | ORAL | 0 refills | Status: DC

## 2024-02-15 MED ORDER — FLUTICASONE PROPIONATE 50 MCG/ACT NA SUSP: 2.0000 | Freq: Every day | NASAL | 0 refills | Status: AC

## 2024-02-15 NOTE — Progress Notes (Signed)
 E-Visit for Sinus Problems  We are sorry that you are not feeling well.  Here is how we plan to help!  Based on what you have shared with me it looks like you have sinusitis.  Sinusitis is inflammation and infection in the sinus cavities of the head.  Based on your presentation I believe you most likely have Acute Bacterial Sinusitis.  This is an infection caused by bacteria and is treated with antibiotics. I have prescribed Augmentin  875mg /125mg  one tablet twice daily with food, for 7 days. and I have also prescribed Flonase  Nasal Spray Use 2 sprays in each nostril daily for 10-14 days. I have also prescribed a cough syrup called Bromfed DM Take 5mL every 6 hours as needed for cough. You may use an oral decongestant such as Mucinex  D or if you have glaucoma or high blood pressure use plain Mucinex . Saline nasal spray help and can safely be used as often as needed for congestion.  If you develop worsening sinus pain, fever or notice severe headache and vision changes, or if symptoms are not better after completion of antibiotic, please schedule an appointment with a health care provider.    Sinus infections are not as easily transmitted as other respiratory infection, however we still recommend that you avoid close contact with loved ones, especially the very young and elderly.  Remember to wash your hands thoroughly throughout the day as this is the number one way to prevent the spread of infection!  Home Care: Only take medications as instructed by your medical team. Complete the entire course of an antibiotic. Do not take these medications with alcohol. A steam or ultrasonic humidifier can help congestion.  You can place a towel over your head and breathe in the steam from hot water coming from a faucet. Avoid close contacts especially the very young and the elderly. Cover your mouth when you cough or sneeze. Always remember to wash your hands.  Get Help Right Away If: You develop worsening  fever or sinus pain. You develop a severe head ache or visual changes. Your symptoms persist after you have completed your treatment plan.  Make sure you Understand these instructions. Will watch your condition. Will get help right away if you are not doing well or get worse.  Your e-visit answers were reviewed by a board certified advanced clinical practitioner to complete your personal care plan.  Depending on the condition, your plan could have included both over the counter or prescription medications.  If there is a problem please reply  once you have received a response from your provider.  Your safety is important to us .  If you have drug allergies check your prescription carefully.    You can use MyChart to ask questions about today's visit, request a non-urgent call back, or ask for a work or school excuse for 24 hours related to this e-Visit. If it has been greater than 24 hours you will need to follow up with your provider, or enter a new e-Visit to address those concerns.  You will get an e-mail in the next two days asking about your experience.  I hope that your e-visit has been valuable and will speed your recovery. Thank you for using e-visits.  I have spent 5 minutes in review of e-visit questionnaire, review and updating patient chart, medical decision making and response to patient.   Delon CHRISTELLA Dickinson, PA-C

## 2024-02-22 ENCOUNTER — Other Ambulatory Visit: Payer: Self-pay | Admitting: Medical Genetics

## 2024-02-25 ENCOUNTER — Ambulatory Visit

## 2024-02-29 ENCOUNTER — Ambulatory Visit

## 2024-02-29 VITALS — BP 104/75 | HR 97 | Temp 98.6°F | Ht 62.0 in | Wt 270.4 lb

## 2024-02-29 DIAGNOSIS — R5383 Other fatigue: Secondary | ICD-10-CM

## 2024-02-29 DIAGNOSIS — R062 Wheezing: Secondary | ICD-10-CM | POA: Insufficient documentation

## 2024-02-29 DIAGNOSIS — E038 Other specified hypothyroidism: Secondary | ICD-10-CM

## 2024-02-29 DIAGNOSIS — J329 Chronic sinusitis, unspecified: Secondary | ICD-10-CM

## 2024-02-29 DIAGNOSIS — F5101 Primary insomnia: Secondary | ICD-10-CM | POA: Insufficient documentation

## 2024-02-29 DIAGNOSIS — E875 Hyperkalemia: Secondary | ICD-10-CM

## 2024-02-29 LAB — POC COVID19 BINAXNOW: SARS Coronavirus 2 Ag: NEGATIVE

## 2024-02-29 LAB — POCT INFLUENZA A/B
Influenza A, POC: NEGATIVE
Influenza B, POC: NEGATIVE

## 2024-02-29 MED ORDER — HYDROXYZINE PAMOATE 25 MG PO CAPS: 25.0000 mg | ORAL_CAPSULE | Freq: Every evening | ORAL | 3 refills | Status: DC

## 2024-02-29 MED ORDER — PREDNISONE 20 MG PO TABS: 40.0000 mg | ORAL_TABLET | Freq: Every day | ORAL | 0 refills | Status: AC

## 2024-02-29 NOTE — Assessment & Plan Note (Signed)
 Controlled with metoprolol  75mg .   BP Readings from Last 3 Encounters:  01/27/24 113/74  12/17/23 121/80  12/15/23 117/82

## 2024-02-29 NOTE — Progress Notes (Signed)
 Established patient visit   Patient: Diane Stokes   DOB: [birth year]   33 y.o. Female  MRN: [id number] Visit Date: 02/29/2024  Today's healthcare provider: Isaiah DELENA Pepper, MD   Chief Complaint  Patient presents with   Medical Management of Chronic Issues    Patient is present for 4 week f/u   URI    Patient reports sweating, nauseated, some congestion (not a new issue), very fatigued.    Subjective    HPI  Discussed the use of AI scribe software for clinical note transcription with the patient, who gave verbal consent to proceed.  History of Present Illness Diane Stokes is a 33 year old female who presents with persistent sinus issues and fatigue. She is accompanied by her mother and nephew.  She has ongoing sinus issues characterized by a blocked nasal sensation and sinus pressure, which have persisted despite antibiotic treatment x2. She uses Flonase  nasal spray to open her airways, but these have not provided significant relief. She works at [employer], where many coworkers have been getting sick, potentially contributing to her symptoms. She reports that her symptoms fluctuate, and today she felt especially unwell, experiencing sweating and feeling hot.  She has a history of thyroid issues and her thyroid medication was recently increased. She experiences significant fatigue, sleeping from the time she gets home until her appointment, which is unusual for her. She has been working twelve days straight, which she believes may contribute to her exhaustion. She also reports feeling hot and sweating, even in cooler environments.  She is currently taking hydroxyzine for sleep, along with two 600 mg gabapentin  tablets. She also takes Zyrtec  daily and uses Flonase  nasal spray once a day. She has two albuterol  inhalers at home, which she has not been using regularly.   Medications: Outpatient Medications Prior to Visit  Medication Sig   albuterol  (VENTOLIN  HFA) 108 (90 Base) MCG/ACT  inhaler Inhale 1-2 puffs into the lungs every 6 (six) hours as needed.   amoxicillin -clavulanate (AUGMENTIN ) 875-125 MG tablet Take 1 tablet by mouth 2 (two) times daily.   aspirin EC 81 MG tablet Take 81 mg by mouth daily. Swallow whole.   brompheniramine-pseudoephedrine-DM 30-2-10 MG/5ML syrup Take 5 mLs by mouth 4 (four) times daily as needed.   buPROPion  (WELLBUTRIN  XL) 150 MG 24 hr tablet Take 1 tablet (150 mg total) by mouth daily.   Cetirizine  HCl 10 MG CAPS Take 1 capsule (10 mg total) by mouth daily.   clindamycin  (CLINDAGEL) 1 % gel Apply topically 2 (two) times daily.   DULoxetine  (CYMBALTA ) 60 MG capsule Take 1 capsule (60 mg total) by mouth daily.   Ferrous Gluconate (IRON 27 PO) Take 27 mg by mouth daily at 2 PM.   fluticasone  (FLONASE ) 50 MCG/ACT nasal spray Place 2 sprays into both nostrils daily.   gabapentin  (NEURONTIN ) 300 MG capsule Take 1 capsule (300 mg total) by mouth at bedtime.   ibuprofen  (ADVIL ) 800 MG tablet Take 1 tablet (800 mg total) by mouth every 8 (eight) hours as needed.   levothyroxine  (SYNTHROID ) 88 MCG tablet Take 1 tablet (88 mcg total) by mouth daily before breakfast.   methocarbamol  (ROBAXIN ) 500 MG tablet Take 1 tablet (500 mg total) by mouth 3 (three) times daily.   metoprolol  tartrate 75 MG TABS Take 1 tablet (75 mg total) by mouth daily.   Multiple Vitamin (MULTIVITAMIN) tablet Take 1 tablet by mouth every morning.    pantoprazole (PROTONIX) 20 MG tablet Take 20 mg  by mouth 2 (two) times daily.   [DISCONTINUED] hydrOXYzine (VISTARIL) 25 MG capsule TAKE 1 CAPSULE BY MOUTH EVERY 6 TO 8 HOURS AS NEEDED FOR ANXIETY AND FOR ITCHING   No facility-administered medications prior to visit.    Review of Systems as noted in HPI.      Objective    BP 104/75 (BP Location: Right Arm, Patient Position: Sitting, Cuff Size: Normal)   Pulse 97   Temp 98.6 F (37 C) (Oral)   Ht 5' 2 (1.575 m)   Wt 270 lb 6.4 oz (122.7 kg)   SpO2 99%   BMI 49.46 kg/m     Physical Exam Constitutional:      Appearance: Normal appearance.  HENT:     Head: Normocephalic and atraumatic.     Nose:     Right Sinus: No maxillary sinus tenderness or frontal sinus tenderness.     Left Sinus: No maxillary sinus tenderness or frontal sinus tenderness.     Mouth/Throat:     Mouth: Mucous membranes are moist.     Pharynx: Oropharynx is clear.     Tonsils: No tonsillar exudate or tonsillar abscesses.  Eyes:     Pupils: Pupils are equal, round, and reactive to light.  Cardiovascular:     Rate and Rhythm: Normal rate and regular rhythm.     Heart sounds: Normal heart sounds.  Pulmonary:     Effort: Pulmonary effort is normal.     Breath sounds: Wheezing present.  Skin:    General: Skin is warm.  Neurological:     General: No focal deficit present.     Mental Status: She is alert.      Results for orders placed or performed in visit on 02/29/24  POC COVID-19 BinaxNow  Result Value Ref Range   SARS Coronavirus 2 Ag Negative Negative  POCT Influenza A/B  Result Value Ref Range   Influenza A, POC Negative Negative   Influenza B, POC Negative Negative    Assessment & Plan     Problem List Items Addressed This Visit       Endocrine   Other specified hypothyroidism - Primary   Chronic, uncontrolled based on last lab work. Increased synthroid  to at last visit. Will recheck TSH today.  Lab Results  Component Value Date   TSH 8.110 (H) 12/15/2023         Relevant Orders   TSH     Other   Wheezing   Relevant Medications   predniSONE  (DELTASONE ) 20 MG tablet   Other Relevant Orders   POC COVID-19 BinaxNow (Completed)   POCT Influenza A/B (Completed)   Primary insomnia   Relevant Medications   hydrOXYzine (VISTARIL) 25 MG capsule   Other Visit Diagnoses       Hyperkalemia       Relevant Orders   Basic metabolic panel with GFR     Other fatigue       Relevant Orders   TSH   Iron, TIBC and Ferritin Panel   CBC   Vitamin D (25  hydroxy)   Vitamin B12     Chronic sinusitis, unspecified location       Relevant Medications   predniSONE  (DELTASONE ) 20 MG tablet      Assessment & Plan Chronic sinusitis Persistent sinus issues unresponsive to 2 courses of antibiotics, suggesting viral or allergic cause. Negative COVID and flu tests today. - Recommend patient continue Zyrtec  and Flonase  daily - Prescribed 5-day oral steroids as below for  wheezing - Recommended ENT referral if symptoms persist.  Wheezing Patient with wheezing noted on lung exam. No known hx of asthma or COPD, although has been prescribed albuterol  inhaler multiple times for bronchitis in the past. Patient does smoke. DDx includes viral bronchitis vs. asthma vs. COPD. Less likely PNA given recent antibiotic course.  - Will treat with prednisone  x 5days - Will hold off on antibiotics for now - Recommend patient continue Zyrtec  and Flonase  daily - Recommend albuterol  inhaler PRN wheezing - Recommend smoking cessation - Consider PFTs once feeling better  Fatigue Fatigue possibly linked to thyroid issues, vitamin deficiencies, iron deficiency or recent illness. - Ordered thyroid function tests. - Ordered vitamin D and B12 levels, iron, CBC  Insomnia Chronic, controlled. Refilled hydroxyzine to take nightly, can take up to 2 as needed.  Hyperkalemia Last K up to 5.4. Will recheck today.    Return in about 4 weeks (around 03/28/2024) for Follow Up.       Isaiah DELENA Pepper, MD  [facility] [phone] (phone) [phone] (fax)

## 2024-02-29 NOTE — Assessment & Plan Note (Signed)
 Chronic, uncontrolled based on last lab work. Increased synthroid  to at last visit. Will recheck TSH today.  Lab Results  Component Value Date   TSH 8.110 (H) 12/15/2023

## 2024-03-01 ENCOUNTER — Ambulatory Visit: Payer: Self-pay

## 2024-03-01 DIAGNOSIS — D751 Secondary polycythemia: Secondary | ICD-10-CM

## 2024-03-01 DIAGNOSIS — R0683 Snoring: Secondary | ICD-10-CM

## 2024-03-01 LAB — BASIC METABOLIC PANEL WITH GFR
BUN/Creatinine Ratio: 16 (ref 9–23)
BUN: 13 mg/dL (ref 6–20)
CO2: 23 mmol/L (ref 20–29)
Calcium: 10.1 mg/dL (ref 8.7–10.2)
Chloride: 102 mmol/L (ref 96–106)
Creatinine, Ser: 0.83 mg/dL (ref 0.57–1.00)
Glucose: 81 mg/dL (ref 70–99)
Potassium: 4.8 mmol/L (ref 3.5–5.2)
Sodium: 139 mmol/L (ref 134–144)
eGFR: 94 mL/min/1.73 (ref >59)

## 2024-03-01 LAB — CBC
Hematocrit: 48.8 % — ABNORMAL HIGH (ref 34.0–46.6)
Hemoglobin: 16.6 g/dL — ABNORMAL HIGH (ref 11.1–15.9)
MCH: 32.6 pg (ref 26.6–33.0)
MCHC: 34 g/dL (ref 31.5–35.7)
MCV: 96 fL (ref 79–97)
Platelets: 279 x10E3/uL (ref 150–450)
RBC: 5.09 x10E6/uL (ref 3.77–5.28)
RDW: 11.8 % (ref 11.7–15.4)
WBC: 7.6 x10E3/uL (ref 3.4–10.8)

## 2024-03-01 LAB — VITAMIN B12: Vitamin B-12: 626 pg/mL (ref 232–1245)

## 2024-03-01 LAB — IRON,TIBC AND FERRITIN PANEL
Ferritin: 201 ng/mL — ABNORMAL HIGH (ref 15–150)
Iron Saturation: 26 % (ref 15–55)
Iron: 68 ug/dL (ref 27–159)
Total Iron Binding Capacity: 261 ug/dL (ref 250–450)
UIBC: 193 ug/dL (ref 131–425)

## 2024-03-01 LAB — TSH: TSH: 3.4 u[IU]/mL (ref 0.450–4.500)

## 2024-03-01 LAB — VITAMIN D 25 HYDROXY (VIT D DEFICIENCY, FRACTURES): Vit D, 25-Hydroxy: 31.4 ng/mL (ref 30.0–100.0)

## 2024-03-24 ENCOUNTER — Ambulatory Visit: Admitting: Sleep Medicine

## 2024-03-24 ENCOUNTER — Other Ambulatory Visit: Payer: Self-pay

## 2024-03-24 DIAGNOSIS — F339 Major depressive disorder, recurrent, unspecified: Secondary | ICD-10-CM

## 2024-03-25 ENCOUNTER — Ambulatory Visit

## 2024-03-29 ENCOUNTER — Ambulatory Visit

## 2024-04-05 ENCOUNTER — Other Ambulatory Visit: Payer: Self-pay

## 2024-04-05 DIAGNOSIS — I10 Essential (primary) hypertension: Secondary | ICD-10-CM

## 2024-04-06 ENCOUNTER — Telehealth: Admitting: Family Medicine

## 2024-04-06 DIAGNOSIS — R112 Nausea with vomiting, unspecified: Secondary | ICD-10-CM

## 2024-04-06 DIAGNOSIS — R6889 Other general symptoms and signs: Secondary | ICD-10-CM

## 2024-04-06 MED ORDER — ONDANSETRON 4 MG PO TBDP: 4.0000 mg | ORAL_TABLET | Freq: Three times a day (TID) | ORAL | 0 refills | Status: DC | PRN

## 2024-04-06 NOTE — Progress Notes (Signed)
 Virtual Visit Consent   Diane Stokes, you are scheduled for a virtual visit with a [facility] provider today. Just as with appointments in the office, your consent must be obtained to participate. Your consent will be active for this visit and any virtual visit you may have with one of our providers in the next 365 days. If you have a MyChart account, a copy of this consent can be sent to you electronically.  As this is a virtual visit, video technology does not allow for your provider to perform a traditional examination. This may limit your provider's ability to fully assess your condition. If your provider identifies any concerns that need to be evaluated in person or the need to arrange testing (such as labs, EKG, etc.), we will make arrangements to do so. Although advances in technology are sophisticated, we cannot ensure that it will always work on either your end or our end. If the connection with a video visit is poor, the visit may have to be switched to a telephone visit. With either a video or telephone visit, we are not always able to ensure that we have a secure connection.  By engaging in this virtual visit, you consent to the provision of healthcare and authorize for your insurance to be billed (if applicable) for the services provided during this visit. Depending on your insurance coverage, you may receive a charge related to this service.  I need to obtain your verbal consent now. Are you willing to proceed with your visit today? Diane Stokes has provided verbal consent on 04/06/2024 for a virtual visit (video or telephone). Chiquita [name], NP  Date: 04/06/2024 12:05 PM   Virtual Visit via Video Note   I, Chiquita [name], connected with  Diane Stokes  ([id number], 12-Jul-1990) on 04/06/2024 at 12:15 PM EST by a video-enabled telemedicine application and verified that I am speaking with the correct person using two identifiers.  Location: Patient: Virtual Visit Location Patient:  Home Provider: Virtual Visit Location Provider: Home Office   I discussed the limitations of evaluation and management by telemedicine and the availability of in person appointments. The patient expressed understanding and agreed to proceed.    History of Present Illness: Diane Stokes is a 32 y.o. who identifies as a female who was assigned female at birth, and is being seen today for exposed to Flu   Onset was 6 days ago after exposure to Flu from work- started with body aches and fatigue Associated symptoms are hot flashes, chills nausea and vomiting, and diarrhea (this has stopped now), headache as well, sore throat, and congestion  Modifying factors are nyquil cold and flu Denies chest pain, shortness of breath, fevers  Exposure to sick contacts- co workers  Problems:  Patient Active Problem List   Diagnosis Date Noted   Wheezing 02/29/2024   Primary insomnia 02/29/2024   Acute non-recurrent sinusitis 01/27/2024   Leg swelling 12/17/2023   Primary hypertension 12/15/2023   Depression, 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   Neuropathic pain 12/15/2023   Other specified hypothyroidism 12/15/2023   Tobacco use 12/15/2023   Iron deficiency anemia 09/16/2022   Gastroesophageal reflux disease without esophagitis 02/26/2022   Anxiety 02/12/2022   Nexplanon  in place 05/29/2021   Palpitations 04/22/2016   Smoker 04/22/2016   Class 3 obesity due to excess calories with body mass index (BMI) of 40.0 to 44.9 in adult 12/27/2013   Morbid obesity (HCC) 05/19/2013   Abnormal Pap smear of cervix 05/19/2013  Allergies: Allergies[1] Medications: Current Medications[2]  Observations/Objective: Patient is well-developed, well-nourished in no acute distress.  Resting comfortably  at home.  Head is normocephalic, atraumatic.  No labored breathing.  Speech is clear and coherent with logical content.  Patient is alert and oriented at baseline.  Congestion tone noted  Assessment and Plan: 1. Nausea  and vomiting, unspecified vomiting type (Primary)  - ondansetron  (ZOFRAN -ODT) 4 MG disintegrating tablet; Take 1 tablet (4 mg total) by mouth every 8 (eight) hours as needed for nausea or vomiting.  Dispense: 20 tablet; Refill: 0  2. Flu-like symptoms  - ondansetron  (ZOFRAN -ODT) 4 MG disintegrating tablet; Take 1 tablet (4 mg total) by mouth every 8 (eight) hours as needed for nausea or vomiting.  Dispense: 20 tablet; Refill: 0   - 5 to 6 days out from possible flu onset after exposure to flu and coworkers.  Never did any testing for flu but still having the symptoms that have been consistent with what has been seen in the community.  Outside of antiviral window.  Given the ongoing nausea and vomiting we will do the Zofran .  Advised to use Tylenol  and ibuprofen  and rotation to help with headache and aches and pains.   Encouraged hydration and rest work note provided. Strict in person follow-up if symptoms do not begin to improve in the next 24 to 48 hours. Info on flu provided on AVS.  Reviewed side effects, risks and benefits of medication.    Patient acknowledged agreement and understanding of the plan.   Past Medical, Surgical, Social History, Allergies, and Medications have been Reviewed.   Follow Up Instructions: I discussed the assessment and treatment plan with the patient. The patient was provided an opportunity to ask questions and all were answered. The patient agreed with the plan and demonstrated an understanding of the instructions.  A copy of instructions were sent to the patient via MyChart unless otherwise noted below.    The patient was advised to call back or seek an in-person evaluation if the symptoms worsen or if the condition fails to improve as anticipated.    Chiquita [name], NP     [1]  Allergies Allergen Reactions   Doxycycline    Klonopin [Clonazepam]    Minocycline Hives   Diflucan  [Fluconazole ] Rash  [2]  Current Outpatient Medications:     albuterol  (VENTOLIN  HFA) 108 (90 Base) MCG/ACT inhaler, Inhale 1-2 puffs into the lungs every 6 (six) hours as needed., Disp: 8 g, Rfl: 0   amoxicillin -clavulanate (AUGMENTIN ) 875-125 MG tablet, Take 1 tablet by mouth 2 (two) times daily., Disp: 14 tablet, Rfl: 0   aspirin EC 81 MG tablet, Take 81 mg by mouth daily. Swallow whole., Disp: , Rfl:    brompheniramine-pseudoephedrine-DM 30-2-10 MG/5ML syrup, Take 5 mLs by mouth 4 (four) times daily as needed., Disp: 120 mL, Rfl: 0   buPROPion  (WELLBUTRIN  XL) 150 MG 24 hr tablet, Take 1 tablet by mouth once daily, Disp: 30 tablet, Rfl: 0   Cetirizine  HCl 10 MG CAPS, Take 1 capsule (10 mg total) by mouth daily., Disp: 15 capsule, Rfl: 0   clindamycin  (CLINDAGEL) 1 % gel, Apply topically 2 (two) times daily., Disp: 60 g, Rfl: 0   DULoxetine  (CYMBALTA ) 60 MG capsule, Take 1 capsule (60 mg total) by mouth daily., Disp: 90 capsule, Rfl: 0   Ferrous Gluconate (IRON 27 PO), Take 27 mg by mouth daily at 2 PM., Disp: , Rfl:    fluticasone  (FLONASE ) 50 MCG/ACT nasal spray,  Place 2 sprays into both nostrils daily., Disp: 16 g, Rfl: 0   gabapentin  (NEURONTIN ) 300 MG capsule, Take 1 capsule (300 mg total) by mouth at bedtime., Disp: 30 capsule, Rfl: 3   hydrOXYzine  (VISTARIL ) 25 MG capsule, Take 1 capsule (25 mg total) by mouth at bedtime., Disp: 90 capsule, Rfl: 3   ibuprofen  (ADVIL ) 800 MG tablet, Take 1 tablet (800 mg total) by mouth every 8 (eight) hours as needed., Disp: 21 tablet, Rfl: 0   levothyroxine  (SYNTHROID ) 88 MCG tablet, Take 1 tablet (88 mcg total) by mouth daily before breakfast., Disp: 30 tablet, Rfl: 3   methocarbamol  (ROBAXIN ) 500 MG tablet, Take 1 tablet (500 mg total) by mouth 3 (three) times daily., Disp: 21 tablet, Rfl: 0   Metoprolol  Tartrate 75 MG TABS, Take 1 tablet by mouth once daily, Disp: 30 tablet, Rfl: 0   Multiple Vitamin (MULTIVITAMIN) tablet, Take 1 tablet by mouth every morning. , Disp: , Rfl:    pantoprazole (PROTONIX) 20 MG  tablet, Take 20 mg by mouth 2 (two) times daily., Disp: , Rfl:

## 2024-04-06 NOTE — Patient Instructions (Addendum)
 Diane Stokes, thank you for joining Diane [name], NP for today's virtual visit.  While this provider is not your primary care provider (PCP), if your PCP is located in our provider database this encounter information will be shared with them immediately following your visit.   A [facility] MyChart account gives you access to today's visit and all your visits, tests, and labs performed at [facility]  click here if you don't have a [facility] MyChart account or go to [email or web address]  Consent: (Patient) Diane Stokes provided verbal consent for this virtual visit at the beginning of the encounter.  Current Medications:  Current Outpatient Medications:    ondansetron  (ZOFRAN -ODT) 4 MG disintegrating tablet, Take 1 tablet (4 mg total) by mouth every 8 (eight) hours as needed for nausea or vomiting., Disp: 20 tablet, Rfl: 0   albuterol  (VENTOLIN  HFA) 108 (90 Base) MCG/ACT inhaler, Inhale 1-2 puffs into the lungs every 6 (six) hours as needed., Disp: 8 g, Rfl: 0   amoxicillin -clavulanate (AUGMENTIN ) 875-125 MG tablet, Take 1 tablet by mouth 2 (two) times daily., Disp: 14 tablet, Rfl: 0   aspirin EC 81 MG tablet, Take 81 mg by mouth daily. Swallow whole., Disp: , Rfl:    brompheniramine-pseudoephedrine-DM 30-2-10 MG/5ML syrup, Take 5 mLs by mouth 4 (four) times daily as needed., Disp: 120 mL, Rfl: 0   buPROPion  (WELLBUTRIN  XL) 150 MG 24 hr tablet, Take 1 tablet by mouth once daily, Disp: 30 tablet, Rfl: 0   Cetirizine  HCl 10 MG CAPS, Take 1 capsule (10 mg total) by mouth daily., Disp: 15 capsule, Rfl: 0   clindamycin  (CLINDAGEL) 1 % gel, Apply topically 2 (two) times daily., Disp: 60 g, Rfl: 0   DULoxetine  (CYMBALTA ) 60 MG capsule, Take 1 capsule (60 mg total) by mouth daily., Disp: 90 capsule, Rfl: 0   Ferrous Gluconate (IRON 27 PO), Take 27 mg by mouth daily at 2 PM., Disp: , Rfl:    fluticasone  (FLONASE ) 50 MCG/ACT nasal spray, Place 2 sprays into both nostrils  daily., Disp: 16 g, Rfl: 0   gabapentin  (NEURONTIN ) 300 MG capsule, Take 1 capsule (300 mg total) by mouth at bedtime., Disp: 30 capsule, Rfl: 3   hydrOXYzine  (VISTARIL ) 25 MG capsule, Take 1 capsule (25 mg total) by mouth at bedtime., Disp: 90 capsule, Rfl: 3   ibuprofen  (ADVIL ) 800 MG tablet, Take 1 tablet (800 mg total) by mouth every 8 (eight) hours as needed., Disp: 21 tablet, Rfl: 0   levothyroxine  (SYNTHROID ) 88 MCG tablet, Take 1 tablet (88 mcg total) by mouth daily before breakfast., Disp: 30 tablet, Rfl: 3   methocarbamol  (ROBAXIN ) 500 MG tablet, Take 1 tablet (500 mg total) by mouth 3 (three) times daily., Disp: 21 tablet, Rfl: 0   Metoprolol  Tartrate 75 MG TABS, Take 1 tablet by mouth once daily, Disp: 30 tablet, Rfl: 0   Multiple Vitamin (MULTIVITAMIN) tablet, Take 1 tablet by mouth every morning. , Disp: , Rfl:    pantoprazole (PROTONIX) 20 MG tablet, Take 20 mg by mouth 2 (two) times daily., Disp: , Rfl:    Medications ordered in this encounter:  Meds ordered this encounter  Medications   ondansetron  (ZOFRAN -ODT) 4 MG disintegrating tablet    Sig: Take 1 tablet (4 mg total) by mouth every 8 (eight) hours as needed for nausea or vomiting.    Dispense:  20 tablet    Refill:  0    Supervising Provider:   BLAISE ALEENE Stokes [  [id number]]     *If you need refills on other medications prior to your next appointment, please contact your pharmacy*  Follow-Up: Call back or seek an in-person evaluation if the symptoms worsen or if the condition fails to improve as anticipated.  [facility] [phone]  Other Instructions  Influenza, Adult Influenza is also called the flu. It's an infection that affects your respiratory tract. This includes your nose, throat, windpipe, and lungs. The flu is contagious. This means it spreads easily from person to person. It causes symptoms that are like a cold. It can also cause a high fever and body aches. What are the causes? The flu  is caused by the influenza virus. You can get it by: Breathing in droplets that are in the air after an infected person coughs or sneezes. Touching something that has the virus on it and then touching your mouth, nose, or eyes. What increases the risk? You may be more likely to get the flu if: You don't wash your hands often. You're near a lot of people during cold and flu season. You touch your mouth, eyes, or nose without washing your hands first. You don't get a flu shot each year. You may also be more at risk for the flu and serious problems, such as a lung infection called pneumonia, if: You're older than 65. You're pregnant. Your immune system is weak. Your immune system is your body's defense system. You have a long-term, or chronic, condition, such as: Heart, kidney, or lung disease. Diabetes. A liver disorder. Asthma. You're very overweight. You have anemia. This is when you don't have enough red blood cells in your body. What are the signs or symptoms? Flu symptoms often start all of a sudden. They may last 4-14 days and include: Fever and chills. Headaches, body aches, or muscle aches. Sore throat. Cough. Runny or stuffy nose. Discomfort in your chest. Not wanting to eat as much as normal. Feeling weak or tired. Feeling dizzy. Nausea or vomiting. How is this diagnosed? The flu may be diagnosed based on your symptoms and medical history. You may also have a physical exam. A swab may be taken from your nose or throat and tested for the virus. How is this treated? If the flu is found early, you can be treated with antiviral medicine. This may be given to you by mouth or through an IV. It can help you feel less sick and get better faster. Taking care of yourself at home can also help your symptoms get better. Your health care provider may tell you to: Take over-the-counter medicines. Drink lots of fluids. The flu often goes away on its own. If you have very bad symptoms  or problems caused by the flu, you may need to be treated in a hospital. Follow these instructions at home: Activity Rest as needed. Get lots of sleep. Stay home from work or school as told by your provider. Leave home only to go see your provider. Do not leave home for other reasons until you don't have a fever for 24 hours without taking medicine. Eating and drinking Take an oral rehydration solution (ORS). This is a drink that is sold at pharmacies and stores. Drink enough fluid to keep your pee pale yellow. Try to drink small amounts of clear fluids. These include water, ice chips, fruit juice mixed with water, and low-calorie sports drinks. Try to eat bland foods that are easy to digest. These include bananas, applesauce, rice, lean meats,  toast, and crackers. Avoid drinks that have a lot of sugar or caffeine in them. These include energy drinks, regular sports drinks, and soda. Do not drink alcohol. Do not eat spicy or fatty foods. General instructions     Take your medicines only as told by your provider. Use a cool mist humidifier to add moisture to the air in your home. This can make it easier for you to breathe. You should also clean the humidifier every day. To do so: Empty the water. Pour clean water in. Cover your mouth and nose when you cough or sneeze. Wash your hands with soap and water often and for at least 20 seconds. It's extra important to do so after you cough or sneeze. If you can't use soap and water, use hand sanitizer. How is this prevented?  Get a flu shot every year. Ask your provider when you should get your flu shot. Stay away from people who are sick during fall and winter. Fall and winter are cold and flu season. Contact a health care provider if: You get new symptoms. You have chest pain. You have watery poop, also called diarrhea. You have a fever. Your cough gets worse. You start to have more mucus. You feel like you may vomit, or you vomit. Get  help right away if: You become short of breath or have trouble breathing. Your skin or nails turn blue. You have very bad pain or stiffness in your neck. You get a sudden headache or pain in your face or ear. You vomit each time you eat or drink. These symptoms may be an emergency. Call 911 right away. Do not wait to see if the symptoms will go away. Do not drive yourself to the hospital. This information is not intended to replace advice given to you by your health care provider. Make sure you discuss any questions you have with your health care provider. Document Revised: 01/08/2023 Document Reviewed: 05/15/2022 Elsevier Patient Education  2024 Elsevier Inc.   If you have been instructed to have an in-person evaluation today at a local Urgent Care facility, please use the link below. It will take you to a list of all of our available [facility], including address, phone number and hours of operation. Please do not delay care.  [facility]  If you or a family member do not have a primary care provider, use the link below to schedule a visit and establish care. When you choose a [facility] primary care physician or advanced practice provider, you gain a long-term partner in health. Find a Primary Care Provider  Learn more about [facility]'s in-office and virtual care options: [place] - Get Care Now

## 2024-04-08 ENCOUNTER — Other Ambulatory Visit: Payer: Self-pay

## 2024-04-08 ENCOUNTER — Telehealth: Payer: Self-pay

## 2024-04-08 DIAGNOSIS — E038 Other specified hypothyroidism: Secondary | ICD-10-CM

## 2024-04-08 NOTE — Telephone Encounter (Signed)
 Copied from CRM #[id number]. Topic: Clinical - Medication Question >> Apr 08, 2024  7:57 AM Ivette P wrote: Reason for CRM: Pt would like to know about  hydrOXYzine  (VISTARIL ) 25 MG capsule for anxiety, is there something else to get that can ehlpw ith sleep at night. pt feels as if the hydrOXYzine  (VISTARIL ) 25 MG capsule is immune and has issues with actually going to bed   attempted melotonin and not working   Pt callback [phone], pls call before end of day.

## 2024-04-08 NOTE — Telephone Encounter (Unsigned)
 Copied from CRM #[id number]. Topic: Clinical - Medication Refill >> Apr 08, 2024  7:54 AM Diane Stokes wrote: Medication: levothyroxine  (SYNTHROID ) 88 MCG table   pantoprazole (PROTONIX) 20 MG tablet   Has the patient contacted their pharmacy? Yes (Agent: If no, request that the patient contact the pharmacy for the refill. If patient does not wish to contact the pharmacy document the reason why and proceed with request.) (Agent: If yes, when and what did the pharmacy advise?)  This is the patient's preferred pharmacy:  [facility] [address] [place] - [address] [place] [address] Phone: [phone] Fax: [phone]  Is this the correct pharmacy for this prescription? Yes If no, delete pharmacy and type the correct one.   Has the prescription been filled recently? No  Is the patient out of the medication? Yes, completely out of protonix  Has the patient been seen for an appointment in the last year OR does the patient have an upcoming appointment? Yes  Can we respond through MyChart? Yes  Agent: Please be advised that Rx refills may take up to 3 business days. We ask that you follow-up with your pharmacy.

## 2024-04-11 NOTE — Telephone Encounter (Signed)
 Needs appointment

## 2024-04-12 MED ORDER — LEVOTHYROXINE SODIUM 88 MCG PO TABS: 88.0000 ug | ORAL_TABLET | Freq: Every day | ORAL | 3 refills | Status: DC

## 2024-04-12 NOTE — Telephone Encounter (Signed)
 Requested Prescriptions  Pending Prescriptions Disp Refills   levothyroxine  (SYNTHROID ) 88 MCG tablet 30 tablet 3    Sig: Take 1 tablet (88 mcg total) by mouth daily before breakfast.     Endocrinology:  Hypothyroid Agents Passed - 04/12/2024 11:29 AM      Passed - TSH in normal range and within 360 days    TSH  Date Value Ref Range Status  02/29/2024 3.400 0.450 - 4.500 uIU/mL Final         Passed - Valid encounter within last 12 months    Recent Outpatient Visits           1 month ago Other specified hypothyroidism   [facility] Isaiah LABOR, [facility]   2 months ago Acute non-recurrent sinusitis, unspecified location   [facility] LABOR, [facility]   3 months ago Morbid obesity [facility])   [facility] LABOR, [facility]   3 months ago Depression, recurrent   [facility] LABOR, [facility]               pantoprazole (PROTONIX) 20 MG tablet      Sig: Take 1 tablet (20 mg total) by mouth 2 (two) times daily.     Gastroenterology: Proton Pump Inhibitors Passed - 04/12/2024 11:29 AM      Passed - Valid encounter within last 12 months    Recent Outpatient Visits           1 month ago Other specified hypothyroidism   [facility] LABOR, [facility]   2 months ago Acute non-recurrent sinusitis, unspecified location   [facility] LABOR, [facility]   3 months ago Morbid obesity [facility])   [facility] Isaiah LABOR, MD   3 months ago Depression, recurrent   [facility] Fr[facility] Isaiah LABOR, MD

## 2024-04-12 NOTE — Telephone Encounter (Signed)
 Requested medication (s) are due for refill today- unsure  Requested medication (s) are on the active medication list -yes  Future visit scheduled -yes  Last refill: 12/15/23  Notes to clinic: listed as historical provider- sent for review   Requested Prescriptions  Pending Prescriptions Disp Refills   pantoprazole (PROTONIX) 20 MG tablet      Sig: Take 1 tablet (20 mg total) by mouth 2 (two) times daily.     Gastroenterology: Proton Pump Inhibitors Passed - 04/12/2024 11:30 AM      Passed - Valid encounter within last 12 months    Recent Outpatient Visits           1 month ago Other specified hypothyroidism   [facility] LABOR, MD   2 months ago Acute non-recurrent sinusitis, unspecified location   [facility] LABOR, MD   3 months ago Morbid obesity [facility])   [facility] LABOR, MD   3 months ago Depression, recurrent   [facility] Isaiah LABOR, MD              Signed Prescriptions Disp Refills   levothyroxine  (SYNTHROID ) 88 MCG tablet 30 tablet 3    Sig: Take 1 tablet (88 mcg total) by mouth daily before breakfast.     Endocrinology:  Hypothyroid Agents Passed - 04/12/2024 11:30 AM      Passed - TSH in normal range and within 360 days    TSH  Date Value Ref Range Status  02/29/2024 3.400 0.450 - 4.500 uIU/mL Final         Passed - Valid encounter within last 12 months    Recent Outpatient Visits           1 month ago Other specified hypothyroidism   [facility] Isaiah LABOR, MD   2 months ago Acute non-recurrent sinusitis, unspecified location   [facility] LABOR, MD   3 months ago Morbid obesity [facility])   [facility] Isaiah LABOR, MD   3 months ago Depression, recurrent   [facility]  [facility] Isaiah LABOR, MD                 Requested Prescriptions  Pending Prescriptions Disp Refills   pantoprazole (PROTONIX) 20 MG tablet      Sig: Take 1 tablet (20 mg total) by mouth 2 (two) times daily.     Gastroenterology: Proton Pump Inhibitors Passed - 04/12/2024 11:30 AM      Passed - Valid encounter within last 12 months    Recent Outpatient Visits           1 month ago Other specified hypothyroidism   [facility] Isaiah LABOR, MD   2 months ago Acute non-recurrent sinusitis, unspecified location   [facility] LABOR, MD   3 months ago Morbid obesity [facility])   [facility] Isaiah LABOR, MD   3 months ago Depression, recurrent   [facility] Isaiah LABOR, MD              Signed Prescriptions Disp Refills   levothyroxine  (SYNTHROID ) 88 MCG tablet 30 tablet 3    Sig: Take 1 tablet (88 mcg total) by mouth daily before breakfast.     Endocrinology:  Hypothyroid Agents Passed - 04/12/2024 11:30 AM      Passed - TSH in normal range and within 360 days    TSH  Date Value Ref Range Status  02/29/2024 3.400 0.450 - 4.500 uIU/mL Final         Passed - Valid encounter within last 12 months    Recent Outpatient Visits           1 month ago Other specified hypothyroidism   [facility] Isaiah LABOR, MD   2 months ago Acute non-recurrent sinusitis, unspecified location   [facility] Isaiah LABOR, MD   3 months ago Morbid obesity [facility])   [facility] Isaiah LABOR, MD   3 months ago Depression, recurrent   [facility] Isaiah LABOR, MD

## 2024-04-18 ENCOUNTER — Other Ambulatory Visit: Payer: Self-pay

## 2024-04-18 MED ORDER — PANTOPRAZOLE SODIUM 20 MG PO TBEC: 20.0000 mg | DELAYED_RELEASE_TABLET | Freq: Two times a day (BID) | ORAL | 0 refills | Status: DC

## 2024-04-18 NOTE — Telephone Encounter (Signed)
 Yes, please refill patient's Protonix per protocol

## 2024-04-25 ENCOUNTER — Telehealth: Admitting: Family Medicine

## 2024-04-25 ENCOUNTER — Encounter

## 2024-04-25 DIAGNOSIS — K047 Periapical abscess without sinus: Secondary | ICD-10-CM

## 2024-04-26 ENCOUNTER — Ambulatory Visit (INDEPENDENT_AMBULATORY_CARE_PROVIDER_SITE_OTHER)

## 2024-04-26 VITALS — BP 112/78 | HR 89 | Temp 98.6°F | Wt 272.2 lb

## 2024-04-26 DIAGNOSIS — F5101 Primary insomnia: Secondary | ICD-10-CM | POA: Diagnosis not present

## 2024-04-26 DIAGNOSIS — F419 Anxiety disorder, unspecified: Secondary | ICD-10-CM | POA: Diagnosis not present

## 2024-04-26 DIAGNOSIS — F902 Attention-deficit hyperactivity disorder, combined type: Secondary | ICD-10-CM | POA: Diagnosis not present

## 2024-04-26 DIAGNOSIS — I10 Essential (primary) hypertension: Secondary | ICD-10-CM | POA: Diagnosis not present

## 2024-04-26 DIAGNOSIS — K219 Gastro-esophageal reflux disease without esophagitis: Secondary | ICD-10-CM | POA: Diagnosis not present

## 2024-04-26 DIAGNOSIS — K047 Periapical abscess without sinus: Secondary | ICD-10-CM | POA: Diagnosis not present

## 2024-04-26 MED ORDER — LISDEXAMFETAMINE DIMESYLATE 30 MG PO CAPS: 30.0000 mg | ORAL_CAPSULE | Freq: Every day | ORAL | 0 refills | Status: DC

## 2024-04-26 MED ORDER — PENICILLIN V POTASSIUM 500 MG PO TABS: 500.0000 mg | ORAL_TABLET | Freq: Three times a day (TID) | ORAL | 0 refills | Status: AC

## 2024-04-26 MED ORDER — HYDROXYZINE PAMOATE 25 MG PO CAPS: 25.0000 mg | ORAL_CAPSULE | Freq: Three times a day (TID) | ORAL | 3 refills | Status: AC | PRN

## 2024-04-26 MED ORDER — NAPROXEN 500 MG PO TABS: 500.0000 mg | ORAL_TABLET | Freq: Two times a day (BID) | ORAL | 0 refills | Status: DC

## 2024-04-26 MED ORDER — METOPROLOL TARTRATE 75 MG PO TABS: 1.0000 | ORAL_TABLET | Freq: Every day | ORAL | 3 refills | Status: AC

## 2024-04-26 NOTE — Progress Notes (Signed)
 E-Visit for Dental Pain  We are sorry that you are not feeling well.  Here is how we plan to help!  Based on what you have shared with me in the questionnaire, it sounds like you have dental infection from a broken tooth  Pen VK 500mg  3 times a day for 7 days and Naprosyn  500mg  2 times a day for 7 days for discomfort  It is imperative that you see a dentist within 10 days of this eVisit to determine the cause of the dental pain and be sure it is adequately treated  A toothache or tooth pain is caused when the nerve in the root of a tooth or surrounding a tooth is irritated. Dental (tooth) infection, decay, injury, or loss of a tooth are the most common causes of dental pain. Pain may also occur after an extraction (tooth is pulled out). Pain sometimes originates from other areas and radiates to the jaw, thus appearing to be tooth pain.Bacteria growing inside your mouth can contribute to gum disease and dental decay, both of which can cause pain. A toothache occurs from inflammation of the central portion of the tooth called pulp. The pulp contains nerve endings that are very sensitive to pain. Inflammation to the pulp or pulpitis may be caused by dental cavities, trauma, and infection.    HOME CARE:   For toothaches: Over-the-counter pain medications such as acetaminophen  or ibuprofen  may be used. Take these as directed on the package while you arrange for a dental appointment. Avoid very cold or hot foods, because they may make the pain worse. You may get relief from biting on a cotton ball soaked in oil of cloves. You can get oil of cloves at most drug stores.  For jaw pain:  Aspirin may be helpful for problems in the joint of the jaw in adults. If pain happens every time you open your mouth widely, the temporomandibular joint (TMJ) may be the source of the pain. Yawning or taking a large bite of food may worsen the pain. An appointment with your doctor or dentist will help you find the  cause.     GET HELP RIGHT AWAY IF:  You have a high fever or chills If you have had a recent head or face injury and develop headache, light headedness, nausea, vomiting, or other symptoms that concern you after an injury to your face or mouth, you could have a more serious injury in addition to your dental injury. A facial rash associated with a toothache: This condition may improve with medication. Contact your doctor for them to decide what is appropriate. Any jaw pain occurring with chest pain: Although jaw pain is most commonly caused by dental disease, it is sometimes referred pain from other areas. People with heart disease, especially people who have had stents placed, people with diabetes, or those who have had heart surgery may have jaw pain as a symptom of heart attack or angina. If your jaw or tooth pain is associated with lightheadedness, sweating, or shortness of breath, you should see a doctor as soon as possible. Trouble swallowing or excessive pain or bleeding from gums: If you have a history of a weakened immune system, diabetes, or steroid use, you may be more susceptible to infections. Infections can often be more severe and extensive or caused by unusual organisms. Dental and gum infections in people with these conditions may require more aggressive treatment. An abscess may need draining or IV antibiotics, for example.  MAKE SURE YOU  Understand these instructions. Will watch your condition. Will get help right away if you are not doing well or get worse.  Thank you for choosing an e-visit.  Your e-visit answers were reviewed by a board certified advanced clinical practitioner to complete your personal care plan. Depending upon the condition, your plan could have included both over the counter or prescription medications.  Please review your pharmacy choice. Make sure the pharmacy is open so you can pick up prescription now. If there is a problem, you may contact your  provider through [employer] and have the prescription routed to another pharmacy.  Your safety is important to us . If you have drug allergies check your prescription carefully.   For the next 24 hours you can use MyChart to ask questions about today's visit, request a non-urgent call back, or ask for a work or school excuse. You will get an email in the next two days asking about your experience. I hope that your e-visit has been valuable and will speed your recovery.  I have spent 5 minutes in review of e-visit questionnaire, review and updating patient chart, medical decision making and response to patient.   Chiquita [name], NP

## 2024-04-26 NOTE — Progress Notes (Signed)
 "     Established patient visit   Patient: Diane Stokes   DOB: [birth year]   34 y.o. Female  MRN: [id number] Visit Date: 04/26/2024  Today's healthcare provider: Isaiah DELENA Pepper, MD   Chief Complaint  Patient presents with   ADHD    Would like to be put on medication again   Migraine    Had a migraine for 4 days. Pt mother has it too   Subjective    HPI  Discussed the use of AI scribe software for clinical note transcription with the patient, who gave verbal consent to proceed.  History of Present Illness Diane Stokes is a 34 year old female who presents for follow-up on ADHD management and migraine evaluation.  She has ongoing difficulties with concentration and attention. Previously, she was on Wellbutrin  for ADHD, but it caused irritability and was discontinued because it did not seem to help. She has a history of using Vyvanse  during her school years, which was effective but discontinued due to cost after losing Medicaid.  She experiences frequent migraines, having had four in the past three weeks. One prolonged migraine was associated with a recent viral illness. Ibuprofen  was ineffective in alleviating her symptoms. She recalls previous use of Imitrex for migraines, which was beneficial, but she lost access to it after losing Medicaid coverage.  Recently, she experienced swelling in her mouth, which she attributes to a broken tooth and  infection. She was prescribed penicillin  but has not started it yet.  Her sleep has improved significantly since starting doxepin and gabapentin , with hydroxyzine  now used only as needed. She is able to sleep through the night and feels rested.  She has a history of high blood pressure and takes metoprolol , which has effectively controlled her blood pressure and heart rate. She recalls a past episode of high heart rate and blood pressure, but has had no recent cardiac symptoms.  She takes over-the-counter omeprazole for heartburn, which has  been effective.   Medications: Show/hide medication list[1]  Review of Systems as noted in HPI.       Objective    BP 112/78   Pulse 89   Temp 98.6 F (37 C) (Oral)   Wt 272 lb 3.2 oz (123.5 kg)   SpO2 98%   BMI 49.79 kg/m     Physical Exam Constitutional:      Appearance: Normal appearance.  HENT:     Head: Normocephalic and atraumatic.     Mouth/Throat:     Mouth: Mucous membranes are moist.  Eyes:     Pupils: Pupils are equal, round, and reactive to light.  Cardiovascular:     Rate and Rhythm: Normal rate and regular rhythm.     Heart sounds: Normal heart sounds.  Pulmonary:     Effort: Pulmonary effort is normal.  Skin:    General: Skin is warm.  Neurological:     General: No focal deficit present.     Mental Status: She is alert.      No results found for any visits on 04/26/24.  Assessment & Plan     Problem List Items Addressed This Visit       Cardiovascular and Mediastinum   Primary hypertension   Relevant Medications   Metoprolol  Tartrate 75 MG TABS     Other   Anxiety (Chronic)   Relevant Medications   doxepin (SINEQUAN) 10 MG capsule   hydrOXYzine  (VISTARIL ) 25 MG capsule   Primary insomnia   Relevant Medications  doxepin (SINEQUAN) 10 MG capsule   Attention deficit hyperactivity disorder (ADHD), combined type - Primary   Relevant Medications   lisdexamfetamine  (VYVANSE ) 30 MG capsule   Other Visit Diagnoses       Gastroesophageal reflux disease, unspecified whether esophagitis present       Relevant Medications   omeprazole (PRILOSEC OTC) 20 MG tablet     Morbid obesity (HCC)       Relevant Medications   lisdexamfetamine  (VYVANSE ) 30 MG capsule   ZEPBOUND 5 MG/0.5ML Pen     Dental infection          Assessment & Plan Attention-deficit hyperactivity disorder, combined type Patient with worsening symptoms of inattention and difficulty concentrating. Hx of ADHD diagnosed as a child. Wellbutrin  ineffective. Previously was  taking 70mg  of Vyvanse  which was effective for her, but stopped after losing Medicaid. Reviewed patient's hx in detail, no apparent contraindications to restart Vyvanse . Blood pressure has been well controlled on metoprolol . Had previous episode of palpitations in 2018 with normal cardiac workup. - Start Vyvanse  30 mg daily in the morning. - Monitor for side effects, including palpitations, chest pain, insomnia, and appetite suppression. - Scheduled monthly follow-up appointment to monitor response and side effects.  Migraine Recurrent migraines possibly related to tooth infection.  - Recommend patient start penicillin  for dental infection - Use Excedrin for migraine relief. - Consider Imitrex if migraines persist  Hypertension Well-controlled with metoprolol . No current symptoms of heart issues. - Continue metoprolol  as prescribed.  Dental infection Recent dental infection, was prescribed penicillin  but has not yet started. Awaiting dental referral. - Start penicillin  for suspected tooth infection. - Follow up with dentist for evaluation and treatment.  Gastroesophageal reflux disease GERD symptoms managed with over-the-counter omeprazole. - Continue omeprazole daily  Morbid Obesity (HCC) BMI 49. Recently started Zepbound. Recommend patient continue healthy lifestyle changes.    Return in about 4 weeks (around 05/24/2024) for Follow Up ADHD.       Isaiah DELENA Pepper, MD  [facility] [phone] (phone) [phone] (fax)     [1]  Outpatient Medications Prior to Visit  Medication Sig   albuterol  (VENTOLIN  HFA) 108 (90 Base) MCG/ACT inhaler Inhale 1-2 puffs into the lungs every 6 (six) hours as needed.   Cetirizine  HCl 10 MG CAPS Take 1 capsule (10 mg total) by mouth daily.   doxepin (SINEQUAN) 10 MG capsule Take 10 mg by mouth at bedtime.   DULoxetine  (CYMBALTA ) 60 MG capsule Take 1 capsule (60 mg total) by mouth daily.   fluticasone  (FLONASE ) 50  MCG/ACT nasal spray Place 2 sprays into both nostrils daily.   gabapentin  (NEURONTIN ) 300 MG capsule Take 1 capsule (300 mg total) by mouth at bedtime.   levothyroxine  (SYNTHROID ) 88 MCG tablet Take 1 tablet (88 mcg total) by mouth daily before breakfast.   Multiple Vitamin (MULTIVITAMIN) tablet Take 1 tablet by mouth every morning.    omeprazole (PRILOSEC OTC) 20 MG tablet Take 20 mg by mouth daily.   ZEPBOUND 5 MG/0.5ML Pen Inject 5 mg into the skin once a week.   [DISCONTINUED] aspirin EC 81 MG tablet Take 81 mg by mouth daily. Swallow whole.   [DISCONTINUED] clindamycin  (CLINDAGEL) 1 % gel Apply topically 2 (two) times daily.   [DISCONTINUED] Ferrous Gluconate (IRON 27 PO) Take 27 mg by mouth daily at 2 PM.   [DISCONTINUED] hydrOXYzine  (VISTARIL ) 25 MG capsule Take 1 capsule (25 mg total) by mouth at bedtime.   [DISCONTINUED] ibuprofen  (ADVIL ) 800 MG  tablet Take 1 tablet (800 mg total) by mouth every 8 (eight) hours as needed.   [DISCONTINUED] Metoprolol  Tartrate 75 MG TABS Take 1 tablet by mouth once daily   [DISCONTINUED] ondansetron  (ZOFRAN -ODT) 4 MG disintegrating tablet Take 1 tablet (4 mg total) by mouth every 8 (eight) hours as needed for nausea or vomiting.   [DISCONTINUED] pantoprazole  (PROTONIX ) 20 MG tablet Take 1 tablet (20 mg total) by mouth 2 (two) times daily.   penicillin  v potassium (VEETID) 500 MG tablet Take 1 tablet (500 mg total) by mouth 3 (three) times daily for 7 days. (Patient not taking: Reported on 04/26/2024)   [DISCONTINUED] brompheniramine-pseudoephedrine-DM 30-2-10 MG/5ML syrup Take 5 mLs by mouth 4 (four) times daily as needed. (Patient not taking: Reported on 04/26/2024)   [DISCONTINUED] buPROPion  (WELLBUTRIN  XL) 150 MG 24 hr tablet Take 1 tablet by mouth once daily (Patient not taking: Reported on 04/26/2024)   [DISCONTINUED] methocarbamol  (ROBAXIN ) 500 MG tablet Take 1 tablet (500 mg total) by mouth 3 (three) times daily. (Patient not taking: Reported on 04/26/2024)    [DISCONTINUED] naproxen  (NAPROSYN ) 500 MG tablet Take 1 tablet (500 mg total) by mouth 2 (two) times daily with a meal. (Patient not taking: Reported on 04/26/2024)   No facility-administered medications prior to visit.   "

## 2024-05-03 ENCOUNTER — Telehealth: Payer: Self-pay

## 2024-05-03 DIAGNOSIS — I10 Essential (primary) hypertension: Secondary | ICD-10-CM

## 2024-05-03 NOTE — Progress Notes (Signed)
 Complex Care Management Note Care Guide Note  05/03/2024 Name: Diane Stokes MRN: [id number] DOB: [birth year]   Complex Care Management Outreach Attempts: An unsuccessful telephone outreach was attempted today to offer the patient information about available complex care management services.  Follow Up Plan:  Additional outreach attempts will be made to offer the patient complex care management information and services.   Encounter Outcome:  No Answer  Dreama [facility]  [facility], [facility] VBCI Assistant Direct Dial: [phone]  Fax: [phone]

## 2024-05-09 NOTE — Progress Notes (Unsigned)
 Complex Care Management Note Care Guide Note  05/09/2024 Name: Diane Stokes MRN: [id number] DOB: Sep 07, 1990   Complex Care Management Outreach Attempts: A second unsuccessful outreach was attempted today to offer the patient with information about available complex care management services.  Follow Up Plan:  Additional outreach attempts will be made to offer the patient complex care management information and services.   Encounter Outcome:  No Answer  Dreama [facility]  [facility], [facility] VBCI Assistant Direct Dial: [phone]  Fax: [phone]

## 2024-05-12 NOTE — Progress Notes (Signed)
 Complex Care Management Note Care Guide Note  05/12/2024 Name: Diane Stokes MRN: [id number] DOB: 1990-12-09   Complex Care Management Outreach Attempts: A third unsuccessful outreach was attempted today to offer the patient with information about available complex care management services.  Follow Up Plan:  No further outreach attempts will be made at this time. We have been unable to contact the patient to offer or enroll patient in complex care management services.  Encounter Outcome:  No Answer  Dreama [facility]  [facility], [facility] VBCI Assistant Direct Dial: [phone]  Fax: [phone]

## 2024-05-24 ENCOUNTER — Ambulatory Visit

## 2024-05-26 ENCOUNTER — Other Ambulatory Visit (HOSPITAL_COMMUNITY): Payer: Self-pay

## 2024-05-27 ENCOUNTER — Ambulatory Visit

## 2024-05-27 VITALS — BP 119/82 | HR 81 | Temp 98.4°F | Wt 266.4 lb

## 2024-05-27 DIAGNOSIS — M792 Neuralgia and neuritis, unspecified: Secondary | ICD-10-CM

## 2024-05-27 DIAGNOSIS — F339 Major depressive disorder, recurrent, unspecified: Secondary | ICD-10-CM

## 2024-05-27 DIAGNOSIS — G43009 Migraine without aura, not intractable, without status migrainosus: Secondary | ICD-10-CM

## 2024-05-27 DIAGNOSIS — E038 Other specified hypothyroidism: Secondary | ICD-10-CM

## 2024-05-27 DIAGNOSIS — G8929 Other chronic pain: Secondary | ICD-10-CM

## 2024-05-27 DIAGNOSIS — R062 Wheezing: Secondary | ICD-10-CM

## 2024-05-27 DIAGNOSIS — F902 Attention-deficit hyperactivity disorder, combined type: Secondary | ICD-10-CM

## 2024-05-27 MED ORDER — DULOXETINE HCL 60 MG PO CPEP: 60.0000 mg | ORAL_CAPSULE | Freq: Every day | ORAL | 3 refills | Status: AC

## 2024-05-27 MED ORDER — LEVOTHYROXINE SODIUM 88 MCG PO TABS: 88.0000 ug | ORAL_TABLET | Freq: Every day | ORAL | 3 refills | Status: AC

## 2024-05-27 MED ORDER — CYCLOBENZAPRINE HCL 5 MG PO TABS: 5.0000 mg | ORAL_TABLET | Freq: Every day | ORAL | 0 refills | Status: AC

## 2024-05-27 MED ORDER — NAPROXEN 500 MG PO TABS: 500.0000 mg | ORAL_TABLET | Freq: Two times a day (BID) | ORAL | 0 refills | Status: AC

## 2024-05-27 MED ORDER — GABAPENTIN 300 MG PO CAPS: 300.0000 mg | ORAL_CAPSULE | Freq: Every day | ORAL | 3 refills | Status: AC

## 2024-05-27 MED ORDER — SUMATRIPTAN SUCCINATE 50 MG PO TABS: 50.0000 mg | ORAL_TABLET | Freq: Every day | ORAL | 3 refills | Status: AC | PRN

## 2024-05-27 MED ORDER — ALBUTEROL SULFATE HFA 108 (90 BASE) MCG/ACT IN AERS: 1.0000 | INHALATION_SPRAY | Freq: Four times a day (QID) | RESPIRATORY_TRACT | 3 refills | Status: AC | PRN

## 2024-05-27 MED ORDER — LISDEXAMFETAMINE DIMESYLATE 40 MG PO CAPS: 40.0000 mg | ORAL_CAPSULE | ORAL | 0 refills | Status: AC

## 2024-05-27 NOTE — Progress Notes (Unsigned)
 "     Established patient visit   Patient: Diane Stokes   DOB: 1990-11-10   33 y.o. Female  MRN: [id number] Visit Date: 05/27/2024  Today's healthcare provider: Isaiah DELENA Pepper, MD   Chief Complaint  Patient presents with   medication check    Started a new medication  Has been helping but is not helping like it needs to be. Concentration    Back Pain    Lower left hurts  If she sits for a long time the pain goes down her leg no tingling or any numbness Taking pain medication and it is not working  Pain causes having trouble sleeping    Subjective    HPI  Discussed the use of AI scribe software for clinical note transcription with the patient, who gave verbal consent to proceed.  History of Present Illness    Wt Readings from Last 3 Encounters:  05/27/24 266 lb 6.4 oz (120.8 kg)  04/26/24 272 lb 3.2 oz (123.5 kg)  02/29/24 270 lb 6.4 oz (122.7 kg)     Medications: Show/hide medication list[1]  Review of Systems as noted in HPI.  {Insert previous labs (optional):23779} {See past labs  Heme  Chem  Endocrine  Serology  Results Review (optional):1}   Objective    BP 119/82   Pulse 81   Temp 98.4 F (36.9 C) (Oral)   Wt 266 lb 6.4 oz (120.8 kg)   SpO2 100%   BMI 48.73 kg/m  {Insert last BP/Wt (optional):23777}{See vitals history (optional):1}  Physical Exam   No results found for any visits on 05/27/24.  Assessment & Plan     Problem List Items Addressed This Visit       Endocrine   Other specified hypothyroidism   Relevant Medications   levothyroxine  (SYNTHROID ) 88 MCG tablet     Other   Depression, recurrent   Relevant Medications   DULoxetine  (CYMBALTA ) 60 MG capsule   Neuropathic pain   Relevant Medications   gabapentin  (NEURONTIN ) 300 MG capsule   Wheezing   Relevant Medications   albuterol  (VENTOLIN  HFA) 108 (90 Base) MCG/ACT inhaler   Attention deficit hyperactivity disorder (ADHD), combined type   Relevant Medications    lisdexamfetamine  (VYVANSE ) 40 MG capsule   Other Visit Diagnoses       Migraine without aura and without status migrainosus, not intractable    -  Primary   Relevant Medications   DULoxetine  (CYMBALTA ) 60 MG capsule   gabapentin  (NEURONTIN ) 300 MG capsule   SUMAtriptan  (IMITREX ) 50 MG tablet   naproxen  (NAPROSYN ) 500 MG tablet   cyclobenzaprine  (FLEXERIL ) 5 MG tablet     Chronic low back pain, unspecified back pain laterality, unspecified whether sciatica present       Relevant Medications   DULoxetine  (CYMBALTA ) 60 MG capsule   gabapentin  (NEURONTIN ) 300 MG capsule   naproxen  (NAPROSYN ) 500 MG tablet   cyclobenzaprine  (FLEXERIL ) 5 MG tablet       Assessment and Plan Assessment & Plan       No follow-ups on file.       Isaiah DELENA Pepper, MD  [facility] [phone] (phone) [phone] (fax)     [1]  Outpatient Medications Prior to Visit  Medication Sig   doxepin (SINEQUAN) 10 MG capsule Take 10 mg by mouth at bedtime.   fluticasone  (FLONASE ) 50 MCG/ACT nasal spray Place 2 sprays into both nostrils daily.   hydrOXYzine  (VISTARIL ) 25 MG capsule Take 1 capsule (25 mg  total) by mouth every 8 (eight) hours as needed for anxiety.   Metoprolol  Tartrate 75 MG TABS Take 1 tablet (75 mg total) by mouth daily.   Multiple Vitamin (MULTIVITAMIN) tablet Take 1 tablet by mouth every morning.    omeprazole (PRILOSEC OTC) 20 MG tablet Take 20 mg by mouth daily.   ZEPBOUND 5 MG/0.5ML Pen Inject 5 mg into the skin once a week.   [DISCONTINUED] albuterol  (VENTOLIN  HFA) 108 (90 Base) MCG/ACT inhaler Inhale 1-2 puffs into the lungs every 6 (six) hours as needed.   [DISCONTINUED] DULoxetine  (CYMBALTA ) 60 MG capsule Take 1 capsule (60 mg total) by mouth daily.   [DISCONTINUED] gabapentin  (NEURONTIN ) 300 MG capsule Take 1 capsule (300 mg total) by mouth at bedtime.   [DISCONTINUED] levothyroxine  (SYNTHROID ) 88 MCG tablet Take 1 tablet (88 mcg total) by mouth  daily before breakfast.   [DISCONTINUED] lisdexamfetamine  (VYVANSE ) 30 MG capsule Take 1 capsule (30 mg total) by mouth daily.   [DISCONTINUED] Cetirizine  HCl 10 MG CAPS Take 1 capsule (10 mg total) by mouth daily. (Patient not taking: Reported on 05/27/2024)   No facility-administered medications prior to visit.   "

## 2024-06-27 ENCOUNTER — Ambulatory Visit
